# Patient Record
Sex: Female | Born: 1980 | Race: Black or African American | Hispanic: No | Marital: Single | Smoking: Never smoker
Health system: Southern US, Community
[De-identification: ages and names within clinical notes are randomized; demographics above are authoritative.]

## PROBLEM LIST (undated history)

## (undated) DIAGNOSIS — G43909 Migraine, unspecified, not intractable, without status migrainosus: Secondary | ICD-10-CM

## (undated) DIAGNOSIS — Z789 Other specified health status: Secondary | ICD-10-CM

## (undated) HISTORY — PX: OTHER SURGICAL HISTORY: SHX169

## (undated) HISTORY — PX: WISDOM TOOTH EXTRACTION: SHX21

## (undated) HISTORY — DX: Other specified health status: Z78.9

---

## 2014-10-23 DIAGNOSIS — Z600 Problems of adjustment to life-cycle transitions: Secondary | ICD-10-CM | POA: Insufficient documentation

## 2014-10-23 DIAGNOSIS — Z803 Family history of malignant neoplasm of breast: Secondary | ICD-10-CM | POA: Insufficient documentation

## 2014-11-03 DIAGNOSIS — E559 Vitamin D deficiency, unspecified: Secondary | ICD-10-CM | POA: Insufficient documentation

## 2017-05-07 DIAGNOSIS — M546 Pain in thoracic spine: Secondary | ICD-10-CM | POA: Insufficient documentation

## 2017-05-07 DIAGNOSIS — M545 Low back pain, unspecified: Secondary | ICD-10-CM

## 2017-05-07 DIAGNOSIS — M542 Cervicalgia: Secondary | ICD-10-CM | POA: Insufficient documentation

## 2017-05-07 HISTORY — DX: Low back pain, unspecified: M54.50

## 2017-06-13 DIAGNOSIS — M25551 Pain in right hip: Secondary | ICD-10-CM | POA: Insufficient documentation

## 2017-06-15 DIAGNOSIS — M654 Radial styloid tenosynovitis [de Quervain]: Secondary | ICD-10-CM | POA: Insufficient documentation

## 2017-07-31 DIAGNOSIS — M79672 Pain in left foot: Secondary | ICD-10-CM | POA: Insufficient documentation

## 2017-07-31 DIAGNOSIS — M21611 Bunion of right foot: Secondary | ICD-10-CM | POA: Insufficient documentation

## 2019-05-27 ENCOUNTER — Ambulatory Visit
Admission: RE | Admit: 2019-05-27 | Discharge: 2019-05-27 | Disposition: A | Discharge status: Home / Self Care | Source: Ambulatory Visit | Attending: Internal Medicine | Admitting: Internal Medicine

## 2019-05-27 ENCOUNTER — Encounter: Payer: Self-pay | Admitting: Internal Medicine

## 2019-05-27 ENCOUNTER — Other Ambulatory Visit: Payer: Self-pay

## 2019-05-27 ENCOUNTER — Ambulatory Visit (INDEPENDENT_AMBULATORY_CARE_PROVIDER_SITE_OTHER): Admitting: Internal Medicine

## 2019-05-27 DIAGNOSIS — G2581 Restless legs syndrome: Secondary | ICD-10-CM | POA: Diagnosis not present

## 2019-05-27 DIAGNOSIS — Z7689 Persons encountering health services in other specified circumstances: Secondary | ICD-10-CM | POA: Diagnosis not present

## 2019-05-27 DIAGNOSIS — M542 Cervicalgia: Secondary | ICD-10-CM | POA: Insufficient documentation

## 2019-05-27 DIAGNOSIS — M545 Low back pain: Secondary | ICD-10-CM | POA: Diagnosis present

## 2019-05-27 DIAGNOSIS — Z0001 Encounter for general adult medical examination with abnormal findings: Secondary | ICD-10-CM | POA: Diagnosis present

## 2019-05-27 DIAGNOSIS — G8929 Other chronic pain: Secondary | ICD-10-CM

## 2019-05-27 MED ORDER — MELOXICAM 15 MG PO TABS: 15.0000 mg | ORAL_TABLET | Freq: Every day | ORAL | 0 refills | Status: DC

## 2019-05-27 MED ORDER — CYCLOBENZAPRINE HCL 10 MG PO TABS: ORAL_TABLET | ORAL | 1 refills | Status: DC

## 2019-05-27 NOTE — Progress Notes (Signed)
[facility] [place], [place] [address]  Internal MEDICINE  Office Visit Note  Patient Name: Jaclyn Gray  [id number]  [id number]  Date of Service: 05/28/2019   Complaints/HPI Pt is here for establishment of PCP. Chief Complaint  Patient presents with  . Medical Management of Chronic Issues    New patient establish care , spider bite left eye , swelling ,no drainage coming from the eye.   HPI Pt is here for PCP. Pt denies any PMHX. Pain LBP around her periods. She also has neck pain. She hears a popping sound in her lower back whenever lifts, but no acute pain  Leg pain since age 57 after falling off an obstacle  Last labs 2019. 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 cyclobenzaprine (FLEXERIL) 10 MG tablet TAKE HALF TO ONE TAB PO QHS FOR NECK PAIN  . meloxicam (MOBIC) 15 MG tablet Take 1 tablet (15 mg total) by mouth daily. WITH FOOD FOR PAIN   No facility-administered encounter medications on file as of 05/27/2019.     Surgical History: Past Surgical History:  Procedure Laterality Date  . none      Medical History: History reviewed. No pertinent past medical history.  Family History: Family History  Problem Relation Age of Onset  . Hypertension Mother   . Breast cancer Mother   . Prostate cancer Father   . Hypertension Father   . Diabetes Father   . Throat cancer Maternal Grandmother   . Colon cancer Maternal Grandmother   . Lung cancer Maternal Grandfather     Social History   Socioeconomic History  . Marital status: Single    Spouse name: Not on file  . Number of children: Not on file  . Years of education: Not on file  . Highest education level:  Not on file  Occupational History  . Not on file  Social Needs  . Financial resource strain: Not on file  . Food insecurity:    Worry: Not on file    Inability: Not on file  . Transportation needs:    Medical: Not on file    Non-medical: Not on file  Tobacco Use  . Smoking status: Never Smoker  . Smokeless tobacco: Never Used  Substance and Sexual Activity  . Alcohol use: Yes    Comment: social   . Drug use: Never  . Sexual activity: Not on file  Lifestyle  . Physical activity:    Days per week: Not on file    Minutes per session: Not on file  . Stress: Not on file  Relationships  . Social connections:    Talks on phone: Not on file    Gets together: Not on file    Attends religious service: Not on file    Active member of club or organization: Not on file    Attends meetings of clubs or organizations: Not on file    Relationship status: Not on file  . Intimate partner violence:    Fear of current or ex partner: Not on file    Emotionally abused: Not on file    Physically abused: Not on file    Forced sexual activity:  Not on file  Other Topics Concern  . Not on file  Social History Narrative  . Not on file     Review of Systems  Constitutional: Negative for chills, diaphoresis and fatigue.  HENT: Negative for ear pain, postnasal drip and sinus pressure.   Eyes: Positive for itching (RESOLVED ). Negative for photophobia, discharge, redness and visual disturbance.  Respiratory: Negative for cough, shortness of breath and wheezing.   Cardiovascular: Negative for chest pain, palpitations and leg swelling.  Gastrointestinal: Negative for abdominal pain, constipation, diarrhea, nausea and vomiting.  Genitourinary: Negative for dysuria and flank pain.  Musculoskeletal: Positive for arthralgias and back pain. Negative for gait problem and neck pain.  Skin: Negative for color change.  Allergic/Immunologic: Negative for environmental allergies and food allergies.   Neurological: Negative for dizziness and headaches.  Hematological: Does not bruise/bleed easily.  Psychiatric/Behavioral: Negative for agitation, behavioral problems (depression) and hallucinations.    Vital Signs: BP 110/80 (BP Location: Left Arm, Patient Position: Sitting, Cuff Size: Normal)   Pulse 90   Temp 98.2 F (36.8 C) (Oral)   Resp 16   Ht 5' 1"  (1.549 m)   Wt 146 lb (66.2 kg)   LMP 05/21/2019   SpO2 99%   BMI 27.59 kg/m    Physical Exam Constitutional:      General: She is not in acute distress.    Appearance: She is well-developed. She is not diaphoretic.  HENT:     Head: Normocephalic and atraumatic.     Mouth/Throat:     Pharynx: No oropharyngeal exudate.  Eyes:     Pupils: Pupils are equal, round, and reactive to light.  Neck:     Musculoskeletal: Normal range of motion and neck supple.     Thyroid: No thyromegaly.     Vascular: No JVD.     Trachea: No tracheal deviation.  Cardiovascular:     Rate and Rhythm: Normal rate and regular rhythm.     Heart sounds: Normal heart sounds. No murmur. No friction rub. No gallop.   Pulmonary:     Effort: Pulmonary effort is normal. No respiratory distress.     Breath sounds: No wheezing or rales.  Chest:     Chest wall: No tenderness.  Abdominal:     General: Bowel sounds are normal.     Palpations: Abdomen is soft.  Musculoskeletal:     Comments: Slight ROM restriction in her C-spine and Lower back   Lymphadenopathy:     Cervical: No cervical adenopathy.  Skin:    General: Skin is warm and dry.  Neurological:     Mental Status: She is alert and oriented to person, place, and time.     Cranial Nerves: No cranial nerve deficit.  Psychiatric:        Behavior: Behavior normal.        Thought Content: Thought content normal.        Judgment: Judgment normal.    Assessment/Plan: 1. Chronic bilateral low back pain without sciatica - Start meloxicam (MOBIC) 15 MG tablet; Take 1 tablet (15 mg total) by mouth  daily. WITH FOOD FOR PAIN  Dispense: 30 tablet; Refill: 0 - CK Total (and CKMB) - Rheumatoid Factor - ANA Direct w/Reflex if Positive - Sed Rate (ESR)  2. Neck pain - cyclobenzaprine (FLEXERIL) 10 MG tablet; TAKE HALF TO ONE TAB PO QHS FOR NECK PAIN  Dispense: 30 tablet; Refill: 1 - ROM excercises   3. Restless leg - Fe+TIBC+Fer; Future  4.  Establishing care with new doctor, encounter for  - Labs were ordered   General Counseling: Tishina verbalizes understanding of the findings of todays visit and agrees with plan of treatment. I have discussed any further diagnostic evaluation that may be needed or ordered today. We also reviewed her medications today. she has been encouraged to call the office with any questions or concerns that should arise related to todays visit.  Counseling: Advised of stretching and yoga at home Avoid heavy lifting  Orders Placed This Encounter  Procedures  . DG Lumbar Spine Complete  . B12 and Folate Panel  . Fe+TIBC+Fer  . CBC with Differential/Platelet  . Lipid Panel With LDL/HDL Ratio  . TSH  . T4, free  . Comprehensive metabolic panel  . Sed Rate (ESR)  . ANA Direct w/Reflex if Positive  . Rheumatoid Factor  . CK Total (and CKMB)    Meds ordered this encounter  Medications  . meloxicam (MOBIC) 15 MG tablet    Sig: Take 1 tablet (15 mg total) by mouth daily. WITH FOOD FOR PAIN    Dispense:  30 tablet    Refill:  0  . cyclobenzaprine (FLEXERIL) 10 MG tablet    Sig: TAKE HALF TO ONE TAB PO QHS FOR NECK PAIN    Dispense:  30 tablet    Refill:  1    Time spent:30 Minutes

## 2019-05-28 LAB — COMPREHENSIVE METABOLIC PANEL
ALT: 15 IU/L (ref 0–32)
AST: 20 IU/L (ref 0–40)
Albumin/Globulin Ratio: 1.6 (ref 1.2–2.2)
Albumin: 4.4 g/dL (ref 3.8–4.8)
Alkaline Phosphatase: 46 IU/L (ref 39–117)
BUN/Creatinine Ratio: 8 — ABNORMAL LOW (ref 9–23)
BUN: 7 mg/dL (ref 6–20)
Bilirubin Total: 0.4 mg/dL (ref 0.0–1.2)
CO2: 24 mmol/L (ref 20–29)
Calcium: 9.8 mg/dL (ref 8.7–10.2)
Chloride: 102 mmol/L (ref 96–106)
Creatinine, Ser: 0.87 mg/dL (ref 0.57–1.00)
GFR calc Af Amer: 98 mL/min/{1.73_m2} (ref >59)
GFR calc non Af Amer: 85 mL/min/{1.73_m2} (ref >59)
Globulin, Total: 2.7 g/dL (ref 1.5–4.5)
Glucose: 90 mg/dL (ref 65–99)
Potassium: 4.7 mmol/L (ref 3.5–5.2)
Sodium: 139 mmol/L (ref 134–144)
Total Protein: 7.1 g/dL (ref 6.0–8.5)

## 2019-05-28 LAB — CBC WITH DIFFERENTIAL/PLATELET
Basophils Absolute: 0 10*3/uL (ref 0.0–0.2)
Basos: 1 %
EOS (ABSOLUTE): 0.1 10*3/uL (ref 0.0–0.4)
Eos: 2 %
Hematocrit: 39.7 % (ref 34.0–46.6)
Hemoglobin: 13.9 g/dL (ref 11.1–15.9)
Immature Grans (Abs): 0 10*3/uL (ref 0.0–0.1)
Immature Granulocytes: 0 %
Lymphocytes Absolute: 1.9 10*3/uL (ref 0.7–3.1)
Lymphs: 29 %
MCH: 32.4 pg (ref 26.6–33.0)
MCHC: 35 g/dL (ref 31.5–35.7)
MCV: 93 fL (ref 79–97)
Monocytes Absolute: 0.5 10*3/uL (ref 0.1–0.9)
Monocytes: 7 %
Neutrophils Absolute: 4.1 10*3/uL (ref 1.4–7.0)
Neutrophils: 61 %
Platelets: 254 10*3/uL (ref 150–450)
RBC: 4.29 x10E6/uL (ref 3.77–5.28)
RDW: 11.9 % (ref 11.7–15.4)
WBC: 6.5 10*3/uL (ref 3.4–10.8)

## 2019-05-28 LAB — CK TOTAL AND CKMB (NOT AT ARMC)
CK-MB Index: 1.3 ng/mL (ref 0.0–5.3)
Total CK: 173 U/L (ref 32–182)

## 2019-05-28 LAB — ANA W/REFLEX IF POSITIVE: Anti Nuclear Antibody (ANA): NEGATIVE

## 2019-05-28 LAB — TSH: TSH: 2.7 u[IU]/mL (ref 0.450–4.500)

## 2019-05-28 LAB — IRON,TIBC AND FERRITIN PANEL
Ferritin: 40 ng/mL (ref 15–150)
Iron Saturation: 33 % (ref 15–55)
Iron: 106 ug/dL (ref 27–159)
Total Iron Binding Capacity: 318 ug/dL (ref 250–450)
UIBC: 212 ug/dL (ref 131–425)

## 2019-05-28 LAB — B12 AND FOLATE PANEL
Folate: 9.4 ng/mL (ref >3.0)
Vitamin B-12: 528 pg/mL (ref 232–1245)

## 2019-05-28 LAB — LIPID PANEL WITH LDL/HDL RATIO
Cholesterol, Total: 212 mg/dL — ABNORMAL HIGH (ref 100–199)
HDL: 73 mg/dL (ref >39)
LDL Calculated: 117 mg/dL — ABNORMAL HIGH (ref 0–99)
LDl/HDL Ratio: 1.6 ratio (ref 0.0–3.2)
Triglycerides: 111 mg/dL (ref 0–149)
VLDL Cholesterol Cal: 22 mg/dL (ref 5–40)

## 2019-05-28 LAB — SEDIMENTATION RATE: Sed Rate: 6 mm/hr (ref 0–32)

## 2019-05-28 LAB — T4, FREE: Free T4: 0.83 ng/dL (ref 0.82–1.77)

## 2019-05-28 LAB — RHEUMATOID FACTOR: Rheumatoid fact SerPl-aCnc: 12.6 IU/mL (ref 0.0–13.9)

## 2019-06-10 ENCOUNTER — Encounter: Payer: Self-pay | Admitting: Internal Medicine

## 2019-06-17 ENCOUNTER — Other Ambulatory Visit: Payer: Self-pay

## 2019-06-17 ENCOUNTER — Other Ambulatory Visit: Payer: Self-pay | Admitting: Internal Medicine

## 2019-06-17 ENCOUNTER — Ambulatory Visit (INDEPENDENT_AMBULATORY_CARE_PROVIDER_SITE_OTHER): Admitting: Internal Medicine

## 2019-06-17 ENCOUNTER — Encounter: Payer: Self-pay | Admitting: Internal Medicine

## 2019-06-17 DIAGNOSIS — M545 Low back pain, unspecified: Secondary | ICD-10-CM

## 2019-06-17 DIAGNOSIS — Z1231 Encounter for screening mammogram for malignant neoplasm of breast: Secondary | ICD-10-CM | POA: Diagnosis not present

## 2019-06-17 DIAGNOSIS — M542 Cervicalgia: Secondary | ICD-10-CM

## 2019-06-17 DIAGNOSIS — G8929 Other chronic pain: Secondary | ICD-10-CM

## 2019-06-17 NOTE — Progress Notes (Signed)
[facility] [name], [place] [address]  Internal MEDICINE  Office Visit Note  Patient Name: Jaclyn Gray  [id number]  [id number]  Date of Service: 06/23/2019  Chief Complaint  Patient presents with  . Medical Management of Chronic Issues    3wk follow up    . Labs Only    review labs    HPI Pt is here for follow up on her back pain, she is feeling better after starting her meds, sleeping better, she wished to conceive and will like to set up a pap smear and mammogram, no other complaints  All labs and xray results are here   Current Medication: Outpatient Encounter Medications as of 06/17/2019  Medication Sig  . cyclobenzaprine (FLEXERIL) 10 MG tablet TAKE HALF TO ONE TAB PO QHS FOR NECK PAIN  . meloxicam (MOBIC) 15 MG tablet Take 1 tablet (15 mg total) by mouth daily. WITH FOOD FOR PAIN   No facility-administered encounter medications on file as of 06/17/2019.     Surgical History: Past Surgical History:  Procedure Laterality Date  . none      Medical History: History reviewed. No pertinent past medical history.  Family History: Family History  Problem Relation Age of Onset  . Hypertension Mother   . Breast cancer Mother   . Prostate cancer Father   . Hypertension Father   . Diabetes Father   . Throat cancer Maternal Grandmother   . Colon cancer Maternal Grandmother   . Lung cancer Maternal Grandfather     Social History   Socioeconomic History  . Marital status: Single    Spouse name: Not on file  . Number of children: Not on file  . Years of education: Not on file  . Highest education level: Not on file  Occupational History  . Not on file  Social Needs  . Financial resource strain: Not on file  . Food insecurity    Worry: Not on file    Inability: Not on file  . Transportation needs    Medical: Not on file    Non-medical: Not on file  Tobacco Use  . Smoking status: Never Smoker  . Smokeless tobacco: Never Used   Substance and Sexual Activity  . Alcohol use: Yes    Comment: social   . Drug use: Never  . Sexual activity: Not on file  Lifestyle  . Physical activity    Days per week: Not on file    Minutes per session: Not on file  . Stress: Not on file  Relationships  . Social Herbalist on phone: Not on file    Gets together: Not on file    Attends religious service: Not on file    Active member of club or organization: Not on file    Attends meetings of clubs or organizations: Not on file    Relationship status: Not on file  . Intimate partner violence    Fear of current or ex partner: Not on file    Emotionally abused: Not on file    Physically abused: Not on file    Forced sexual activity: Not on file  Other Topics Concern  . Not on file  Social History Narrative  . Not on file    Review of Systems  Constitutional: Negative for chills.  HENT: Negative.   Respiratory: Negative for cough, shortness of breath and wheezing.   Cardiovascular: Negative for chest pain, palpitations and leg swelling.  Genitourinary: Negative  for dysuria and flank pain.  Musculoskeletal: Positive for back pain. Negative for arthralgias and gait problem.  Allergic/Immunologic: Negative for environmental allergies and food allergies.  Neurological: Negative for dizziness and headaches.  Psychiatric/Behavioral: Negative for agitation, behavioral problems (depression) and hallucinations.   Vital Signs: BP 132/89 (BP Location: Right Arm, Patient Position: Sitting, Cuff Size: Normal)   Pulse 83   Resp 16   Ht 5\' 1"  (1.549 m)   Wt 149 lb (67.6 kg)   LMP 05/21/2019   SpO2 98%   BMI 28.15 kg/m    Physical Exam Vitals signs reviewed.  Constitutional:      Appearance: Normal appearance.  HENT:     Head: Normocephalic and atraumatic.  Neck:     Musculoskeletal: Normal range of motion and neck supple.  Cardiovascular:     Rate and Rhythm: Normal rate and regular rhythm.  Pulmonary:      Effort: Pulmonary effort is normal.     Breath sounds: Normal breath sounds.  Musculoskeletal:        General: No tenderness.  Neurological:     General: No focal deficit present.     Mental Status: She is alert and oriented to person, place, and time.    Assessment/Plan: 1. Chronic bilateral low back pain without sciatica - Improved, continue supportive care   2. Neck pain - Resolved   3. Visit for screening mammogram - Mammogram is ordered   General Counseling: Sharnette verbalizes understanding of the findings of todays visit and agrees with plan of treatment. I have discussed any further diagnostic evaluation that may be needed or ordered today. We also reviewed her medications today. she has been encouraged to call the office with any questions or concerns that should arise related to todays visit.   Time spent:20Minutes  Dr Lavera Guise Internal medicine

## 2019-07-18 ENCOUNTER — Ambulatory Visit (INDEPENDENT_AMBULATORY_CARE_PROVIDER_SITE_OTHER): Admitting: Nurse Practitioner

## 2019-07-18 ENCOUNTER — Other Ambulatory Visit: Payer: Self-pay

## 2019-07-18 ENCOUNTER — Encounter: Payer: Self-pay | Admitting: Nurse Practitioner

## 2019-07-18 VITALS — BP 119/86 | HR 75 | Resp 16 | Ht 61.0 in | Wt 150.0 lb

## 2019-07-18 DIAGNOSIS — I8311 Varicose veins of right lower extremity with inflammation: Secondary | ICD-10-CM | POA: Diagnosis not present

## 2019-07-18 DIAGNOSIS — Z124 Encounter for screening for malignant neoplasm of cervix: Secondary | ICD-10-CM | POA: Diagnosis not present

## 2019-07-18 DIAGNOSIS — Z0001 Encounter for general adult medical examination with abnormal findings: Secondary | ICD-10-CM

## 2019-07-18 DIAGNOSIS — Z9189 Other specified personal risk factors, not elsewhere classified: Secondary | ICD-10-CM

## 2019-07-18 DIAGNOSIS — R300 Dysuria: Secondary | ICD-10-CM

## 2019-07-18 DIAGNOSIS — I8312 Varicose veins of left lower extremity with inflammation: Secondary | ICD-10-CM

## 2019-07-18 NOTE — Progress Notes (Signed)
[facility] [address] [place], [place] [address]  Internal MEDICINE  Office Visit Note  Patient Name: Jaclyn Gray  11/27/1981  [id number]  Date of Service: 08/02/2019  Chief Complaint  Patient presents with  . Annual Exam  . Gynecologic Exam     The patient is here for health maintenance exam and pap smear. She is having tenderness of both lower legs. She does have some varicose veins. Pain is more severe when she is on her feet for long periods of time. Tenderness gets better with rest.  She is interested in talking to OB/GYN provider about fertility in the future. She is considering having a child and knows that she has increased risk of adverse outcomes due to age.     Current Medication: Outpatient Encounter Medications as of 07/18/2019  Medication Sig  . cyclobenzaprine (FLEXERIL) 10 MG tablet TAKE HALF TO ONE TAB PO QHS FOR NECK PAIN  . meloxicam (MOBIC) 15 MG tablet Take 1 tablet (15 mg total) by mouth daily. WITH FOOD FOR PAIN   No facility-administered encounter medications on file as of 07/18/2019.     Surgical History: Past Surgical History:  Procedure Laterality Date  . none      Medical History: History reviewed. No pertinent past medical history.  Family History: Family History  Problem Relation Age of Onset  . Hypertension Mother   . Breast cancer Mother 56  . Prostate cancer Father   . Hypertension Father   . Diabetes Father   . Throat cancer Maternal Grandmother   . Colon cancer Maternal Grandmother   . Lung cancer Maternal Grandfather       Review of Systems  Constitutional: Negative for chills, fatigue and unexpected weight change.  HENT: Negative for congestion, postnasal drip, rhinorrhea, sneezing and sore throat.   Eyes: Negative for redness.  Respiratory: Negative for cough, chest tightness and shortness of breath.   Cardiovascular: Negative for chest pain and palpitations.       Varicose veins with lower leg  pain.   Gastrointestinal: Negative for abdominal pain, constipation, diarrhea, nausea and vomiting.  Endocrine: Negative for cold intolerance, heat intolerance, polydipsia and polyuria.  Genitourinary: Negative for dysuria, frequency, menstrual problem and vaginal discharge.  Musculoskeletal: Negative for arthralgias, back pain, joint swelling and neck pain.  Skin: Negative for rash.  Allergic/Immunologic: Negative for environmental allergies.  Neurological: Negative for dizziness, tremors, numbness and headaches.  Hematological: Negative for adenopathy. Does not bruise/bleed easily.  Psychiatric/Behavioral: Negative for behavioral problems (Depression), sleep disturbance and suicidal ideas. The patient is not nervous/anxious.      Today's Vitals   07/18/19 1154  BP: 119/86  Pulse: 75  Resp: 16  SpO2: 99%  Weight: 150 lb (68 kg)  Height: 5' 1" (1.549 m)   Body mass index is 28.34 kg/m.  Physical Exam Vitals signs and nursing note reviewed.  Constitutional:      General: She is not in acute distress.    Appearance: Normal appearance. She is well-developed. She is not diaphoretic.  HENT:     Head: Normocephalic and atraumatic.     Mouth/Throat:     Pharynx: No oropharyngeal exudate.  Eyes:     Pupils: Pupils are equal, round, and reactive to light.  Neck:     Musculoskeletal: Normal range of motion and neck supple.     Thyroid: No thyromegaly.     Vascular: No JVD.     Trachea: No tracheal deviation.  Cardiovascular:       Rate and Rhythm: Normal rate and regular rhythm.     Pulses: Normal pulses.     Heart sounds: Normal heart sounds. No murmur. No friction rub. No gallop.   Pulmonary:     Effort: Pulmonary effort is normal. No respiratory distress.     Breath sounds: Normal breath sounds. No wheezing or rales.  Chest:     Chest wall: No tenderness.     Breasts:        Right: Normal. No swelling, bleeding, inverted nipple, mass, nipple discharge, skin change or  tenderness.        Left: Normal. No swelling, bleeding, inverted nipple, mass, nipple discharge, skin change or tenderness.  Abdominal:     General: Bowel sounds are normal.     Palpations: Abdomen is soft.     Tenderness: There is no abdominal tenderness.     Hernia: There is no hernia in the left inguinal area or right inguinal area.  Genitourinary:    General: Normal vulva.     Exam position: Supine.     Labia:        Right: No tenderness.        Left: No tenderness.      Vagina: No vaginal discharge, erythema or tenderness.     Cervix: No cervical motion tenderness, discharge, friability or erythema.     Uterus: Normal.      Adnexa: Right adnexa normal and left adnexa normal.     Comments: No tenderness, masses, or organomeglay present during bimanual exam . Musculoskeletal: Normal range of motion.  Lymphadenopathy:     Cervical: No cervical adenopathy.     Lower Body: No right inguinal adenopathy. No left inguinal adenopathy.  Skin:    General: Skin is warm and dry.  Neurological:     Mental Status: She is alert and oriented to person, place, and time.     Cranial Nerves: No cranial nerve deficit.  Psychiatric:        Behavior: Behavior normal.        Thought Content: Thought content normal.        Judgment: Judgment normal.      LABS: Recent Results (from the past 2160 hour(s))  Lipid Panel With LDL/HDL Ratio     Status: Abnormal   Collection Time: 05/27/19 11:53 AM  Result Value Ref Range   Cholesterol, Total 212 (H) 100 - 199 mg/dL   Triglycerides 111 0 - 149 mg/dL   HDL 73 >39 mg/dL   VLDL Cholesterol Cal 22 5 - 40 mg/dL   LDL Calculated 117 (H) 0 - 99 mg/dL   LDl/HDL Ratio 1.6 0.0 - 3.2 ratio    Comment:                                     LDL/HDL Ratio                                             Men  Women                               1/2 Avg.Risk  1.0    1.5  Avg.Risk  3.6    3.2                                2X Avg.Risk   6.2    5.0                                3X Avg.Risk  8.0    6.1   T4, free     Status: None   Collection Time: 05/27/19 11:54 AM  Result Value Ref Range   Free T4 0.83 0.82 - 1.77 ng/dL  TSH     Status: None   Collection Time: 05/27/19 11:54 AM  Result Value Ref Range   TSH 2.700 0.450 - 4.500 uIU/mL  CBC with Differential/Platelet     Status: None   Collection Time: 05/27/19 11:54 AM  Result Value Ref Range   WBC 6.5 3.4 - 10.8 x10E3/uL   RBC 4.29 3.77 - 5.28 x10E6/uL   Hemoglobin 13.9 11.1 - 15.9 g/dL   Hematocrit 39.7 34.0 - 46.6 %   MCV 93 79 - 97 fL   MCH 32.4 26.6 - 33.0 pg   MCHC 35.0 31.5 - 35.7 g/dL   RDW 11.9 11.7 - 15.4 %   Platelets 254 150 - 450 x10E3/uL   Neutrophils 61 Not Estab. %   Lymphs 29 Not Estab. %   Monocytes 7 Not Estab. %   Eos 2 Not Estab. %   Basos 1 Not Estab. %   Neutrophils Absolute 4.1 1.4 - 7.0 x10E3/uL   Lymphocytes Absolute 1.9 0.7 - 3.1 x10E3/uL   Monocytes Absolute 0.5 0.1 - 0.9 x10E3/uL   EOS (ABSOLUTE) 0.1 0.0 - 0.4 x10E3/uL   Basophils Absolute 0.0 0.0 - 0.2 x10E3/uL   Immature Granulocytes 0 Not Estab. %   Immature Grans (Abs) 0.0 0.0 - 0.1 x10E3/uL  ANA Direct w/Reflex if Positive     Status: None   Collection Time: 05/27/19 11:59 AM  Result Value Ref Range   Anti Nuclear Antibody (ANA) Negative Negative  Sed Rate (ESR)     Status: None   Collection Time: 05/27/19 11:59 AM  Result Value Ref Range   Sed Rate 6 0 - 32 mm/hr  B12 and Folate Panel     Status: None   Collection Time: 05/27/19 12:00 PM  Result Value Ref Range   Vitamin B-12 528 232 - 1,245 pg/mL   Folate 9.4 >3.0 ng/mL    Comment: A serum folate concentration of less than 3.1 ng/mL is considered to represent clinical deficiency.   Rheumatoid Factor     Status: None   Collection Time: 05/27/19 12:05 PM  Result Value Ref Range   Rhuematoid fact SerPl-aCnc 12.6 0.0 - 13.9 IU/mL  Fe+TIBC+Fer     Status: None   Collection Time: 05/27/19 12:05 PM  Result Value  Ref Range   Total Iron Binding Capacity 318 250 - 450 ug/dL   UIBC 212 131 - 425 ug/dL   Iron 106 27 - 159 ug/dL   Iron Saturation 33 15 - 55 %   Ferritin 40 15 - 150 ng/mL  Comprehensive metabolic panel     Status: Abnormal   Collection Time: 05/27/19 12:06 PM  Result Value Ref Range   Glucose 90 65 - 99 mg/dL   BUN 7 6 - 20 mg/dL   Creatinine, Ser 0.87 0.57 - 1.00   mg/dL   GFR calc non Af Amer 85 >59 mL/min/1.73   GFR calc Af Amer 98 >59 mL/min/1.73   BUN/Creatinine Ratio 8 (L) 9 - 23   Sodium 139 134 - 144 mmol/L   Potassium 4.7 3.5 - 5.2 mmol/L   Chloride 102 96 - 106 mmol/L   CO2 24 20 - 29 mmol/L   Calcium 9.8 8.7 - 10.2 mg/dL   Total Protein 7.1 6.0 - 8.5 g/dL   Albumin 4.4 3.8 - 4.8 g/dL   Globulin, Total 2.7 1.5 - 4.5 g/dL   Albumin/Globulin Ratio 1.6 1.2 - 2.2   Bilirubin Total 0.4 0.0 - 1.2 mg/dL   Alkaline Phosphatase 46 39 - 117 IU/L   AST 20 0 - 40 IU/L   ALT 15 0 - 32 IU/L  CK Total (and CKMB)     Status: None   Collection Time: 05/27/19 12:06 PM  Result Value Ref Range   Total CK 173 32 - 182 U/L    Comment:               **Please note reference interval change**   CK-MB Index 1.3 0.0 - 5.3 ng/mL  UA/M w/rflx Culture, Routine     Status: None   Collection Time: 07/18/19 11:30 AM   Specimen: Urine   URINE  Result Value Ref Range   Specific Gravity, UA 1.006 1.005 - 1.030   pH, UA 5.5 5.0 - 7.5   Color, UA Yellow Yellow   Appearance Ur Clear Clear   Leukocytes,UA Negative Negative   Protein,UA Negative Negative/Trace   Glucose, UA Negative Negative   Ketones, UA Negative Negative   RBC, UA Negative Negative   Bilirubin, UA Negative Negative   Urobilinogen, Ur 0.2 0.2 - 1.0 mg/dL   Nitrite, UA Negative Negative   Microscopic Examination Comment     Comment: Microscopic follows if indicated.   Microscopic Examination See below:     Comment: Microscopic was indicated and was performed.   Urinalysis Reflex Comment     Comment: This specimen will not  reflex to a Urine Culture.  Microscopic Examination     Status: None   Collection Time: 07/18/19 11:30 AM   URINE  Result Value Ref Range   WBC, UA 0-5 0 - 5 /hpf   RBC None seen 0 - 2 /hpf   Epithelial Cells (non renal) 0-10 0 - 10 /hpf   Casts None seen None seen /lpf   Bacteria, UA Few None seen/Few  Pap IG and HPV (high risk) DNA detection     Status: Abnormal   Collection Time: 07/18/19 12:04 PM  Result Value Ref Range   Interpretation NILM,QC     Comment: NEGATIVE FOR INTRAEPITHELIAL LESION OR MALIGNANCY. THIS SPECIMEN WAS RESCREENED AS PART OF OUR QUALITY CONTROL PROGRAM.    Category NIL     Comment: Negative for Intraepithelial Lesion   Adequacy SECNI,AOCX     Comment: Satisfactory for evaluation. No endocervical component is identified. The absence of an endocervical component was confirmed by an additional screening evaluation.    Clinician Provided ICD10 Comment     Comment: Z12.4   Performed by: Comment     Comment: Mickel Crow, Cytotechnologist (ASCP)   QC reviewed by: Comment     Comment: Clyde Canterbury, Cytotechnologist (ASCP)   Note: Comment     Comment: The Pap smear is a screening test designed to aid in the detection of premalignant and malignant conditions of the uterine cervix.  It  is not a diagnostic procedure and should not be used as the sole means of detecting cervical cancer.  Both false-positive and false-negative reports do occur.    Test Methodology Comment     Comment: This liquid based ThinPrep(R) pap test was screened with the use of an image guided system.    HPV, high-risk Positive (A) Negative    Comment: This nucleic acid amplification high-risk HPV test detects thirteen high-risk types (16,18,31,33,35,39,45,51,52,56,58,59,68) without differentiation.    Assessment/Plan: 1. Encounter for annual general medical examination with abnormal findings in adult Annual health maintenance exam today.   2. Varicose veins of both lower  extremities with inflammation Will ultrasound the veins of bilateral lower extremities for further evaluation.  - VAS US LOWER EXTREMITY VENOUS REFLUX; Future  3. At risk for fertility problems Refer to OB/BYN for further evaluation.  - Ambulatory referral to Obstetrics / Gynecology  4. Routine cervical smear - Pap IG and HPV (high risk) DNA detection  5. Dysuria - UA/M w/rflx Culture, Routine  General Counseling: Kessler verbalizes understanding of the findings of todays visit and agrees with plan of treatment. I have discussed any further diagnostic evaluation that may be needed or ordered today. We also reviewed her medications today. she has been encouraged to call the office with any questions or concerns that should arise related to todays visit.    Counseling:  This patient was seen by Heather Boscia FNP Collaboration with Dr Fozia M Khan as a part of collaborative care agreement  Orders Placed This Encounter  Procedures  . Microscopic Examination  . UA/M w/rflx Culture, Routine  . Ambulatory referral to Obstetrics / Gynecology      Time spent: 30 Minutes      Fozia M Khan, MD  Internal Medicine  

## 2019-07-19 LAB — MICROSCOPIC EXAMINATION
Casts: NONE SEEN /lpf
RBC, Urine: NONE SEEN /hpf (ref 0–2)

## 2019-07-19 LAB — UA/M W/RFLX CULTURE, ROUTINE
Bilirubin, UA: NEGATIVE
Glucose, UA: NEGATIVE
Ketones, UA: NEGATIVE
Leukocytes,UA: NEGATIVE
Nitrite, UA: NEGATIVE
Protein,UA: NEGATIVE
RBC, UA: NEGATIVE
Specific Gravity, UA: 1.006 (ref 1.005–1.030)
Urobilinogen, Ur: 0.2 mg/dL (ref 0.2–1.0)
pH, UA: 5.5 (ref 5.0–7.5)

## 2019-07-25 ENCOUNTER — Ambulatory Visit
Admission: RE | Admit: 2019-07-25 | Discharge: 2019-07-25 | Disposition: A | Discharge status: Home / Self Care | Source: Ambulatory Visit | Attending: Internal Medicine | Admitting: Internal Medicine

## 2019-07-25 ENCOUNTER — Other Ambulatory Visit: Payer: Self-pay

## 2019-07-25 ENCOUNTER — Ambulatory Visit

## 2019-07-25 DIAGNOSIS — Z1231 Encounter for screening mammogram for malignant neoplasm of breast: Secondary | ICD-10-CM | POA: Diagnosis present

## 2019-07-25 DIAGNOSIS — I8312 Varicose veins of left lower extremity with inflammation: Secondary | ICD-10-CM

## 2019-07-25 DIAGNOSIS — I8311 Varicose veins of right lower extremity with inflammation: Secondary | ICD-10-CM

## 2019-07-28 ENCOUNTER — Other Ambulatory Visit: Payer: Self-pay | Admitting: Internal Medicine

## 2019-07-28 DIAGNOSIS — R928 Other abnormal and inconclusive findings on diagnostic imaging of breast: Secondary | ICD-10-CM

## 2019-07-28 DIAGNOSIS — N6310 Unspecified lump in the right breast, unspecified quadrant: Secondary | ICD-10-CM

## 2019-07-29 ENCOUNTER — Ambulatory Visit: Admitting: Adult Health

## 2019-07-29 LAB — PAP IG AND HPV HIGH-RISK: HPV, high-risk: POSITIVE — AB

## 2019-07-30 ENCOUNTER — Ambulatory Visit (INDEPENDENT_AMBULATORY_CARE_PROVIDER_SITE_OTHER): Admitting: Adult Health

## 2019-07-30 ENCOUNTER — Other Ambulatory Visit: Payer: Self-pay

## 2019-07-30 ENCOUNTER — Encounter: Payer: Self-pay | Admitting: Adult Health

## 2019-07-30 VITALS — HR 77 | Ht 61.0 in | Wt 147.0 lb

## 2019-07-30 DIAGNOSIS — I8311 Varicose veins of right lower extremity with inflammation: Secondary | ICD-10-CM | POA: Diagnosis not present

## 2019-07-30 DIAGNOSIS — G2581 Restless legs syndrome: Secondary | ICD-10-CM

## 2019-07-30 DIAGNOSIS — I8312 Varicose veins of left lower extremity with inflammation: Secondary | ICD-10-CM | POA: Diagnosis not present

## 2019-07-30 NOTE — Progress Notes (Signed)
[facility] [place], [place] [address]  Internal MEDICINE  Telephone Visit  Patient Name: Jaclyn Gray  [id number]  [id number]  Date of Service: 07/30/2019  I connected with the patient at [phone] by telephone and verified the patients identity using two identifiers.   I discussed the limitations, risks, security and privacy concerns of performing an evaluation and management service by telephone and the availability of in person appointments. I also discussed with the patient that there may be a patient responsible charge related to the service.  The patient expressed understanding and agrees to proceed.    Chief Complaint  Patient presents with  . Telephone Assessment  . Telephone Screen  . Follow-up    u/s for venous lower extremity    HPI  Pt seen via telephone for follow on [place] results.  She had a lower extremity venous ultrasound. The was no evidence of DVT on ultrasound.  There was some small reflux noted in left greater saphenous vein.    Current Medication: Outpatient Encounter Medications as of 07/30/2019  Medication Sig  . cyclobenzaprine (FLEXERIL) 10 MG tablet TAKE HALF TO ONE TAB PO QHS FOR NECK PAIN  . meloxicam (MOBIC) 15 MG tablet Take 1 tablet (15 mg total) by mouth daily. WITH FOOD FOR PAIN   No facility-administered encounter medications on file as of 07/30/2019.     Surgical History: Past Surgical History:  Procedure Laterality Date  . none      Medical History: History reviewed. No pertinent past medical history.  Family History: Family History  Problem Relation Age of Onset  . Hypertension Mother   . Breast cancer Mother 89  . Prostate cancer Father   . Hypertension Father   . Diabetes Father   . Throat cancer Maternal Grandmother   . Colon cancer Maternal Grandmother   . Lung cancer Maternal Grandfather     Social History   Socioeconomic History  . Marital status: Single    Spouse name: Not on file  . Number of  children: Not on file  . Years of education: Not on file  . Highest education level: Not on file  Occupational History  . Not on file  Social Needs  . Financial resource strain: Not on file  . Food insecurity    Worry: Not on file    Inability: Not on file  . Transportation needs    Medical: Not on file    Non-medical: Not on file  Tobacco Use  . Smoking status: Never Smoker  . Smokeless tobacco: Never Used  Substance and Sexual Activity  . Alcohol use: Yes    Comment: social   . Drug use: Never  . Sexual activity: Not on file  Lifestyle  . Physical activity    Days per week: Not on file    Minutes per session: Not on file  . Stress: Not on file  Relationships  . Social Herbalist on phone: Not on file    Gets together: Not on file    Attends religious service: Not on file    Active member of club or organization: Not on file    Attends meetings of clubs or organizations: Not on file    Relationship status: Not on file  . Intimate partner violence    Fear of current or ex partner: Not on file    Emotionally abused: Not on file    Physically abused: Not on file    Forced sexual  activity: Not on file  Other Topics Concern  . Not on file  Social History Narrative  . Not on file      Review of Systems  Constitutional: Negative for chills, fatigue and unexpected weight change.  HENT: Negative for congestion, rhinorrhea, sneezing and sore throat.   Eyes: Negative for photophobia, pain and redness.  Respiratory: Negative for cough, chest tightness and shortness of breath.   Cardiovascular: Negative for chest pain and palpitations.  Gastrointestinal: Negative for abdominal pain, constipation, diarrhea, nausea and vomiting.  Endocrine: Negative.   Genitourinary: Negative for dysuria and frequency.  Musculoskeletal: Negative for arthralgias, back pain, joint swelling and neck pain.  Skin: Negative for rash.  Allergic/Immunologic: Negative.   Neurological:  Negative for tremors and numbness.  Hematological: Negative for adenopathy. Does not bruise/bleed easily.  Psychiatric/Behavioral: Negative for behavioral problems and sleep disturbance. The patient is not nervous/anxious.     Vital Signs: Pulse 77   Ht 5\' 1"  (2.876 m)   Wt 147 lb (66.7 kg)   LMP 07/10/2019 (Approximate)   BMI 27.78 kg/m    Observation/Objective:  Well appearing, nad noted.    Assessment/Plan: 1. Varicose veins of both lower extremities with inflammation Discussed [place] results with patient.  Will continue to follow, and discussed referral to vascular in future.   2. Restless leg Stable, continue present management.   General Counseling: Khrystyne verbalizes understanding of the findings of today's phone visit and agrees with plan of treatment. I have discussed any further diagnostic evaluation that may be needed or ordered today. We also reviewed her medications today. she has been encouraged to call the office with any questions or concerns that should arise related to todays visit.    No orders of the defined types were placed in this encounter.   No orders of the defined types were placed in this encounter.   Time spent: [place] AGNP-C Internal medicine

## 2019-08-02 DIAGNOSIS — I8311 Varicose veins of right lower extremity with inflammation: Secondary | ICD-10-CM

## 2019-08-02 DIAGNOSIS — Z9189 Other specified personal risk factors, not elsewhere classified: Secondary | ICD-10-CM

## 2019-08-02 DIAGNOSIS — Z124 Encounter for screening for malignant neoplasm of cervix: Secondary | ICD-10-CM | POA: Insufficient documentation

## 2019-08-02 DIAGNOSIS — R300 Dysuria: Secondary | ICD-10-CM | POA: Insufficient documentation

## 2019-08-02 DIAGNOSIS — R87810 Cervical high risk human papillomavirus (HPV) DNA test positive: Secondary | ICD-10-CM | POA: Insufficient documentation

## 2019-08-02 DIAGNOSIS — I8312 Varicose veins of left lower extremity with inflammation: Secondary | ICD-10-CM

## 2019-08-02 HISTORY — DX: Varicose veins of right lower extremity with inflammation: I83.11

## 2019-08-02 HISTORY — DX: Varicose veins of right lower extremity with inflammation: I83.12

## 2019-08-02 HISTORY — DX: Other specified personal risk factors, not elsewhere classified: Z91.89

## 2019-08-05 ENCOUNTER — Encounter: Admitting: Obstetrics and Gynecology

## 2019-08-05 NOTE — Progress Notes (Signed)
I have and she is being referred to GYN. Initially to talk about fertility options, but adding the HPV positive into consultation.

## 2019-08-06 NOTE — Progress Notes (Signed)
Review at visit 07/28/2019

## 2019-08-07 ENCOUNTER — Ambulatory Visit
Admission: RE | Admit: 2019-08-07 | Discharge: 2019-08-07 | Disposition: A | Discharge status: Home / Self Care | Source: Ambulatory Visit | Attending: Internal Medicine | Admitting: Internal Medicine

## 2019-08-07 DIAGNOSIS — N6310 Unspecified lump in the right breast, unspecified quadrant: Secondary | ICD-10-CM | POA: Diagnosis present

## 2019-08-07 DIAGNOSIS — R928 Other abnormal and inconclusive findings on diagnostic imaging of breast: Secondary | ICD-10-CM | POA: Insufficient documentation

## 2019-08-18 ENCOUNTER — Encounter: Admitting: Obstetrics and Gynecology

## 2019-09-04 ENCOUNTER — Encounter: Admitting: Obstetrics and Gynecology

## 2019-10-23 ENCOUNTER — Inpatient Hospital Stay: Admission: RE | Admit: 2019-10-23 | Source: Ambulatory Visit

## 2019-10-23 ENCOUNTER — Telehealth: Admitting: Physician Assistant

## 2019-10-23 DIAGNOSIS — R0602 Shortness of breath: Secondary | ICD-10-CM

## 2019-10-23 DIAGNOSIS — Z20822 Contact with and (suspected) exposure to covid-19: Secondary | ICD-10-CM

## 2019-10-23 MED ORDER — ALBUTEROL SULFATE HFA 108 (90 BASE) MCG/ACT IN AERS: 2.0000 | INHALATION_SPRAY | Freq: Four times a day (QID) | RESPIRATORY_TRACT | 0 refills | Status: DC | PRN

## 2019-10-23 MED ORDER — BENZONATATE 100 MG PO CAPS: 100.0000 mg | ORAL_CAPSULE | Freq: Three times a day (TID) | ORAL | 0 refills | Status: DC | PRN

## 2019-10-23 NOTE — Progress Notes (Signed)
E-Visit for Corona Virus Screening   Your current symptoms could be consistent with the coronavirus.  Many health care providers can now test patients at their office but not all are.  [place] [facility] has multiple testing sites. For information on our COVID testing locations and hours go to [email or web address]  Please quarantine yourself while awaiting your test results.  We are enrolling you in our [place] for [place] . Daily you will receive a questionnaire within the [facility] website. Our COVID 19 response team willl be monitoriing your responses daily.    COVID-19 is a respiratory illness with symptoms that are similar to the flu. Symptoms are typically mild to moderate, but there have been cases of severe illness and death due to the virus. The following symptoms may appear 2-14 days after exposure: . Fever . Cough . Shortness of breath or difficulty breathing . Chills . Repeated shaking with chills . Muscle pain . Headache . Sore throat . New loss of taste or smell . Fatigue . Congestion or runny nose . Nausea or vomiting . Diarrhea  It is vitally important that if you feel that you have an infection such as this virus or any other virus that you stay home and away from places where you may spread it to others.  You should self-quarantine for 14 days if you have symptoms that could potentially be coronavirus or have been in close contact a with a person diagnosed with COVID-19 within the last 2 weeks. You should avoid contact with people age 35 and older.   You should wear a mask or cloth face covering over your nose and mouth if you must be around other people or animals, including pets (even at home). Try to stay at least 6 feet away from other people. This will protect the people around you.  You can use medication such as A prescription cough medication called Tessalon Perles 100 mg. You may take 1-2 capsules every 8 hours as needed for cough  and A prescription inhaler called Albuterol MDI 90 mcg /actuation 2 puffs every 4 hours as needed for shortness of breath, wheezing, cough  You may also take acetaminophen (Tylenol) as needed for fever.   Reduce your risk of any infection by using the same precautions used for avoiding the common cold or flu:  Marland Kitchen Wash your hands often with soap and warm water for at least 20 seconds.  If soap and water are not readily available, use an alcohol-based hand sanitizer with at least 60% alcohol.  . If coughing or sneezing, cover your mouth and nose by coughing or sneezing into the elbow areas of your shirt or coat, into a tissue or into your sleeve (not your hands). . Avoid shaking hands with others and consider head nods or verbal greetings only. . Avoid touching your eyes, nose, or mouth with unwashed hands.  . Avoid close contact with people who are sick. . Avoid places or events with large numbers of people in one location, like concerts or sporting events. . Carefully consider travel plans you have or are making. . If you are planning any travel outside or inside the [place], visit the CDC's Travelers' Health webpage for the latest health notices. . If you have some symptoms but not all symptoms, continue to monitor at home and seek medical attention if your symptoms worsen. . If you are having a medical emergency, call 911.  HOME CARE . Only take medications as instructed by your medical team. .  Drink plenty of fluids and get plenty of rest. . A steam or ultrasonic humidifier can help if you have congestion.   GET HELP RIGHT AWAY IF YOU HAVE EMERGENCY WARNING SIGNS** FOR COVID-19. If you or someone is showing any of these signs seek emergency medical care immediately. Call 911 or proceed to your closest emergency facility if: . You develop worsening high fever. . Trouble breathing . Bluish lips or face . Persistent pain or pressure in the chest . New confusion . Inability to wake or stay  awake . You cough up blood. . Your symptoms become more severe  **This list is not all possible symptoms. Contact your medical provider for any symptoms that are sever or concerning to you.   MAKE SURE YOU   Understand these instructions.  Will watch your condition.  Will get help right away if you are not doing well or get worse.  Your e-visit answers were reviewed by a board certified advanced clinical practitioner to complete your personal care plan.  Depending on the condition, your plan could have included both over the counter or prescription medications.  If there is a problem please reply once you have received a response from your provider.  Your safety is important to [place].  If you have drug allergies check your prescription carefully.    You can use MyChart to ask questions about today's visit, request a non-urgent call back, or ask for a work or school excuse for 24 hours related to this e-Visit. If it has been greater than 24 hours you will need to follow up with your provider, or enter a new e-Visit to address those concerns. You will get an e-mail in the next two days asking about your experience.  I hope that your e-visit has been valuable and will speed your recovery. Thank you for using e-visits.  [place] than 5 minutes, yet less than 10 minutes of time have been spent researching, coordinating, and implementing care for this patient today.   Thank you for the details you included in the comment boxes. Those details are very helpful in determining the best course of treatment for you and help [place] to provide the best care.

## 2019-10-28 ENCOUNTER — Other Ambulatory Visit: Payer: Self-pay

## 2019-10-28 ENCOUNTER — Encounter (INDEPENDENT_AMBULATORY_CARE_PROVIDER_SITE_OTHER): Payer: Self-pay

## 2019-10-28 ENCOUNTER — Ambulatory Visit (INDEPENDENT_AMBULATORY_CARE_PROVIDER_SITE_OTHER)
Admission: RE | Admit: 2019-10-28 | Discharge: 2019-10-28 | Disposition: A | Discharge status: Home / Self Care | Source: Ambulatory Visit

## 2019-10-28 DIAGNOSIS — Z20822 Contact with and (suspected) exposure to covid-19: Secondary | ICD-10-CM

## 2019-10-28 DIAGNOSIS — Z20828 Contact with and (suspected) exposure to other viral communicable diseases: Secondary | ICD-10-CM

## 2019-10-28 NOTE — Discharge Instructions (Addendum)
Go to  [address] for COVID testing  Use precautions and stay quarantined until we get your results  Work note given You can take OTC medications as needed

## 2019-10-28 NOTE — ED Provider Notes (Signed)
.  Virtual Visit via Video Note:  Jaclyn Gray  initiated request for Telemedicine visit with [facility] Urgent Care team. I connected with Jaclyn Gray  on 10/28/2019 at 10:39 AM  for a synchronized telemedicine visit using a video enabled HIPPA compliant telemedicine application. I verified that I am speaking with Jaclyn Gray  using two identifiers. Orvan July, NP  was physically located in a [facility] Urgent care site and Graycee Sphar was located at a different location.   The limitations of evaluation and management by telemedicine as well as the availability of in-person appointments were discussed. Patient was informed that she  may incur a bill ( including co-pay) for this virtual visit encounter. [name]  expressed understanding and gave verbal consent to proceed with virtual visit.     History of Present Illness:Jaclyn Gray  is a 38 y.o. female presents with weakness, fever, sore throat.  Symptoms have been constant, waxing waning since 10/23/2019.  She works in [employer].  Concerned about Covid.  She has been taking over-the-counter medication with some relief of symptoms.  No cough, chest pain or shortness of breath. She is around a lot of people from different states working in the [employer]. Unsure of exposures.   No past medical history on file.  No Known Allergies      Observations/Objective:VITALS: Per patient if applicable, see vitals. GENERAL: Alert, appears well and in no acute distress. HEENT: Atraumatic, conjunctiva clear, no obvious abnormalities on inspection of external nose and ears. NECK: Normal movements of the head and neck. CARDIOPULMONARY: No increased WOB. Speaking in clear sentences. I:E ratio WNL.  MS: Moves all visible extremities without noticeable abnormality. PSYCH: Pleasant and cooperative, well-groomed. Speech normal rate and rhythm. Affect is appropriate. Insight and judgement are appropriate.  Attention is focused, linear, and appropriate.  NEURO: Oriented as arrived to appointment on time with no prompting. Moves both UE equally.  SKIN: No obvious lesions, wounds, erythema, or cyanosis noted on face or hands.     Assessment and Plan: Viral illness with high suspicion for Covid. Patient sent for Covid testing Recommended quarantine and work note given to return with negative Covid results. Patient understanding and agree. OTC meds as needed.    Follow Up Instructions: Follow up as needed for continued or worsening symptoms     I discussed the assessment and treatment plan with the patient. The patient was provided an opportunity to ask questions and all were answered. The patient agreed with the plan and demonstrated an understanding of the instructions.   The patient was advised to call back or seek an in-person evaluation if the symptoms worsen or if the condition fails to improve as anticipated.     Orvan July, NP  10/28/2019 10:39 AM          Orvan July, NP 10/28/19 1519

## 2019-10-29 ENCOUNTER — Encounter: Admitting: Obstetrics and Gynecology

## 2019-10-29 LAB — NOVEL CORONAVIRUS, NAA: SARS-CoV-2, NAA: NOT DETECTED

## 2019-10-30 ENCOUNTER — Ambulatory Visit (INDEPENDENT_AMBULATORY_CARE_PROVIDER_SITE_OTHER): Admitting: Adult Health

## 2019-10-30 ENCOUNTER — Other Ambulatory Visit: Payer: Self-pay

## 2019-10-30 ENCOUNTER — Encounter: Payer: Self-pay | Admitting: Adult Health

## 2019-10-30 VITALS — HR 78 | Ht 61.0 in | Wt 147.0 lb

## 2019-10-30 DIAGNOSIS — B349 Viral infection, unspecified: Secondary | ICD-10-CM

## 2019-10-30 NOTE — Progress Notes (Signed)
[facility] [place], [place] [address]  Internal MEDICINE  Telephone Visit  Patient Name: Jaclyn Gray  [id number]  [id number]  Date of Service: 10/30/2019  I connected with the patient at [phone] by telephone and verified the patients identity using two identifiers.   I discussed the limitations, risks, security and privacy concerns of performing an evaluation and management service by telephone and the availability of in person appointments. I also discussed with the patient that there may be a patient responsible charge related to the service.  The patient expressed understanding and agrees to proceed.    Chief Complaint  Patient presents with  . Telephone Assessment  . Telephone Screen  . Fever    warm to touch   . Fatigue  . Cough    HPI  PT seen video, she reports a few days of feeling warm, cough, sore throat, fatigue, headache.  She reports having a negative covid test a few days ago. She denies any other symptoms.    Current Medication: Outpatient Encounter Medications as of 10/30/2019  Medication Sig  . albuterol (VENTOLIN HFA) 108 (90 Base) MCG/ACT inhaler Inhale 2 puffs into the lungs every 6 (six) hours as needed for wheezing or shortness of breath.  . benzonatate (TESSALON) 100 MG capsule Take 1-2 capsules (100-200 mg total) by mouth 3 (three) times daily as needed for cough.  . cyclobenzaprine (FLEXERIL) 10 MG tablet TAKE HALF TO ONE TAB PO QHS FOR NECK PAIN  . meloxicam (MOBIC) 15 MG tablet Take 1 tablet (15 mg total) by mouth daily. WITH FOOD FOR PAIN   No facility-administered encounter medications on file as of 10/30/2019.     Surgical History: Past Surgical History:  Procedure Laterality Date  . none      Medical History: History reviewed. No pertinent past medical history.  Family History: Family History  Problem Relation Age of Onset  . Hypertension Mother   . Breast cancer Mother 69  . Prostate cancer Father   .  Hypertension Father   . Diabetes Father   . Throat cancer Maternal Grandmother   . Colon cancer Maternal Grandmother   . Lung cancer Maternal Grandfather     Social History   Socioeconomic History  . Marital status: Single    Spouse name: Not on file  . Number of children: Not on file  . Years of education: Not on file  . Highest education level: Not on file  Occupational History  . Not on file  Social Needs  . Financial resource strain: Not on file  . Food insecurity    Worry: Not on file    Inability: Not on file  . Transportation needs    Medical: Not on file    Non-medical: Not on file  Tobacco Use  . Smoking status: Never Smoker  . Smokeless tobacco: Never Used  Substance and Sexual Activity  . Alcohol use: Yes    Comment: social   . Drug use: Never  . Sexual activity: Not on file  Lifestyle  . Physical activity    Days per week: Not on file    Minutes per session: Not on file  . Stress: Not on file  Relationships  . Social Herbalist on phone: Not on file    Gets together: Not on file    Attends religious service: Not on file    Active member of club or organization: Not on file    Attends meetings of  clubs or organizations: Not on file    Relationship status: Not on file  . Intimate partner violence    Fear of current or ex partner: Not on file    Emotionally abused: Not on file    Physically abused: Not on file    Forced sexual activity: Not on file  Other Topics Concern  . Not on file  Social History Narrative  . Not on file      Review of Systems  Constitutional: Positive for chills, fatigue and fever. Negative for unexpected weight change.  HENT: Positive for sore throat. Negative for congestion, rhinorrhea and sneezing.   Eyes: Negative for photophobia, pain and redness.  Respiratory: Positive for cough. Negative for chest tightness and shortness of breath.   Cardiovascular: Negative for chest pain and palpitations.   Gastrointestinal: Negative for abdominal pain, constipation, diarrhea, nausea and vomiting.  Endocrine: Negative.   Genitourinary: Negative for dysuria and frequency.  Musculoskeletal: Negative for arthralgias, back pain, joint swelling and neck pain.  Skin: Negative for rash.  Allergic/Immunologic: Negative.   Neurological: Negative for tremors and numbness.  Hematological: Negative for adenopathy. Does not bruise/bleed easily.  Psychiatric/Behavioral: Negative for behavioral problems and sleep disturbance. The patient is not nervous/anxious.     Vital Signs: Pulse 78   Ht 5\' 1"  (1.549 m)   Wt 147 lb (66.7 kg)   BMI 27.78 kg/m    Observation/Objective: NAD noted, speaking in full sentences.     Assessment/Plan: 1. Acute viral syndrome Pt encouraged to use OTC medication as discussed.  If symptoms worsen or fail to improve, pt will return or call clinic.   General Counseling: Chole verbalizes understanding of the findings of today's phone visit and agrees with plan of treatment. I have discussed any further diagnostic evaluation that may be needed or ordered today. We also reviewed her medications today. she has been encouraged to call the office with any questions or concerns that should arise related to todays visit.    No orders of the defined types were placed in this encounter.   No orders of the defined types were placed in this encounter.   Time spent: [place] AGNP-C Internal medicine

## 2019-11-19 ENCOUNTER — Other Ambulatory Visit: Payer: Self-pay | Admitting: Physician Assistant

## 2019-12-15 ENCOUNTER — Encounter: Admitting: Obstetrics and Gynecology

## 2019-12-16 ENCOUNTER — Telehealth: Payer: Self-pay | Admitting: Obstetrics & Gynecology

## 2019-12-16 NOTE — Telephone Encounter (Signed)
[facility] referring to discuss issues with fertility nd pregnancy. Called and left voicemail for patient to call back to be schedule

## 2020-01-09 ENCOUNTER — Encounter: Admitting: Obstetrics and Gynecology

## 2020-02-11 ENCOUNTER — Encounter: Payer: Self-pay | Admitting: Obstetrics and Gynecology

## 2020-02-11 ENCOUNTER — Ambulatory Visit: Admitting: Obstetrics and Gynecology

## 2020-02-11 ENCOUNTER — Other Ambulatory Visit: Payer: Self-pay

## 2020-02-11 VITALS — BP 120/80 | Ht 61.0 in | Wt 162.0 lb

## 2020-02-11 DIAGNOSIS — Z3189 Encounter for other procreative management: Secondary | ICD-10-CM | POA: Diagnosis not present

## 2020-02-11 DIAGNOSIS — Z319 Encounter for procreative management, unspecified: Secondary | ICD-10-CM

## 2020-02-11 NOTE — Patient Instructions (Signed)
Intrauterine Insemination Intrauterine insemination (IUI) is a procedure to help a woman become pregnant. IUI is also called artificial insemination. A woman becomes pregnant when a sperm meets and fertilizes her egg. With IUI, healthy sperm are placed directly into the uterus. This increases the chances of pregnancy. IUI is done when you are fertile. You can be fertile:  According to your natural ovulation cycle.  After you take medicines to make your body release an egg (ovulation induction). You may choose IUI if:  The cause of infertility is not known. IUI may be the first treatment.  The lower part of your uterus (cervix) is narrow or produces thick mucus.  Your female partner has problems with making and releasing sperm.  You want to get pregnant without a female partner. What are the risks? Generally, this is a safe procedure. However, problems may occur, including:  Infection.  Complications from a pregnancy with more than one baby. Ovulation induction may make your body produce multiple eggs. This increases your chances of having twins, triplets, or other multiples. Carrying multiple babies increases your risk for some pregnancy complications, such as having an early labor and having low birth weight babies. What happens before the procedure? What happens before the procedure depends on your condition and your female partner's condition, if this applies.  If you are having ovulation induction, you will take medicines before the procedure. This may include: ? An oral medicine to stimulate the release of an egg from your ovaries. You may release several eggs at one time. ? An injection of a hormone (human chorionic gonadotropin, or hCG) that makes eggs mature and release from your ovaries.  The best time to do IUI is 24-36 hours after ovulation, depending on whether you take medicine to trigger ovulation. To confirm ovulation and time of the procedure, you may need to: ? Use an  ovulation detection kit. This is a test you do at home. ? Have ultrasound imaging and a blood test to detect ovulation.  If your female partner is giving a sperm sample, he may be asked to come in about 2 hours before the procedure to donate the sample. The sperm sample will be washed free of spermatic fluid, and healthy sperm will be concentrated for the procedure.  If you are using a frozen sample, including using donor sperm, the sperm will be unfrozen and prepared. What happens during the procedure?   You will undress from the waist down and lie on a procedure table. You will put your feet up in stirrups and be covered with a sheet.  Your health care provider will use an instrument to open your vagina (speculum) and clean your cervix.  A syringe containing the sperm sample will be attached to a thin, flexible tube (catheter).  The catheter will be passed through your vagina and cervix and into your uterus.  Sperm will be deposited in your uterus.  The catheter and speculum will be removed. The procedure may vary among health care providers and clinics. What can I expect after the procedure?  You will need to lie still on your back for about 15 minutes on the procedure table.  You will be able to drive home and return to your usual activities.  You may have some light vaginal bleeding (spotting) or cramping after the procedure. Follow these instructions at home:  Take over-the-counter and prescription medicines only as told by your health care provider.  Return to your normal activities as told by your health  care provider. Ask your health care provider what activities are safe for you.  Tell your health care provider if you have persistent bleeding, severe cramps, vaginal discharge, chills, or a fever.  In about 2 weeks, you may be asked to do a home pregnancy test or to return to your health care provider for a pregnancy blood test.  Keep all follow-up visits as told by your  health care provider. This is important. Summary  Intrauterine insemination (IUI) increases your chances of becoming pregnant. The procedure involves placing healthy sperm directly into your uterus through a thin tube (catheter) that is inserted through the vagina and cervix.  You may have IUI after taking medicines to stimulate the release of an egg from your ovaries (ovulation induction).  For IUI, you can use donor sperm or sperm from your partner.  It is normal to have light vaginal bleeding or cramping after the procedure. Tell your health care provider if you have persistent bleeding, severe cramps, vaginal discharge, chills, or a fever.  In about 2 weeks, you may be asked to do a home pregnancy test or to return to your health care provider for a pregnancy blood test. This information is not intended to replace advice given to you by your health care provider. Make sure you discuss any questions you have with your health care provider. Document Revised: 12/04/2018 Document Reviewed: 12/06/2018 Elsevier Patient Education  2020 [employer].

## 2020-02-11 NOTE — Progress Notes (Signed)
Patient ID: Jaclyn Gray, female   DOB: [birth year], 39 y.o.   MRN: [id number]  Reason for Consult: Referral (Wanting to conceive, has never been pregnant, same sex partner )   Referred by Ronnell Freshwater, NP  Subjective:     HPI:  Jaclyn Gray is a 39 y.o. female   Jaclyn Gray presents today for a infertility consultation.  She reports that she would like to conceive.  She is currently in a same-sex relationship.  She is uncertain if her partner would like a child but she would definitely like to be pregnant.  She has been looking into sperm banks and is interested in home insemination.  She says that this is available through the [place] sperm [facility].  She reports that as part of the process for receiving the home insemination kit she needs a OB/GYN.  She says that she will need some lab work and the sperm bank needs to send the specimen to the doctor's office.  She has never tried to conceive in the past.  She has never been pregnant before.  She denies any history of sexually transmitted infections.  She denies any history of fibroids.  She denies any history of ovarian cyst.  She has not yet initiated a prenatal vitamin.   Gynecological History  LMP: 01/16/2020 Describes periods as regular monthly with 3 days of moderate bleeding Last pap smear: NIL, HPV + History of STDs: denies Sexually Active: yes - same sex relationship  Obstetrical History [id number]  History reviewed. No pertinent past medical history. Family History  Problem Relation Age of Onset  . Hypertension Mother   . Breast cancer Mother 82  . Prostate cancer Father   . Hypertension Father   . Diabetes Father   . Throat cancer Maternal Grandmother   . Colon cancer Maternal Grandmother   . Lung cancer Maternal Grandfather    Past Surgical History:  Procedure Laterality Date  . none    . WISDOM TOOTH EXTRACTION     x4 extracted     Short Social History:  Social History   Tobacco Use  . Smoking  status: Never Smoker  . Smokeless tobacco: Never Used  Substance Use Topics  . Alcohol use: Yes    Comment: social     No Known Allergies  No current outpatient medications on file.   No current facility-administered medications for this visit.    Review of Systems  Constitutional: Negative for chills, fatigue, fever and unexpected weight change.  HENT: Negative for trouble swallowing.  Eyes: Negative for loss of vision.  Respiratory: Negative for cough, shortness of breath and wheezing.  Cardiovascular: Negative for chest pain, leg swelling, palpitations and syncope.  GI: Negative for abdominal pain, blood in stool, diarrhea, nausea and vomiting.  GU: Negative for difficulty urinating, dysuria, frequency and hematuria.  Musculoskeletal: Negative for back pain, leg pain and joint pain.  Skin: Negative for rash.  Neurological: Negative for dizziness, headaches, light-headedness, numbness and seizures.  Psychiatric: Negative for behavioral problem, confusion, depressed mood and sleep disturbance.        Objective:  Objective   Vitals:   02/11/20 1332  BP: 120/80  Weight: 162 lb (73.5 kg)  Height: _0  (1.549 m)   Body mass index is 30.61 kg/m.  Physical Exam Vitals and nursing note reviewed.  Constitutional:      Appearance: She is well-developed.  HENT:     Head: Normocephalic and atraumatic.  Eyes:     Pupils: Pupils are  equal, round, and reactive to light.  Cardiovascular:     Rate and Rhythm: Normal rate and regular rhythm.  Pulmonary:     Effort: Pulmonary effort is normal. No respiratory distress.  Skin:    General: Skin is warm and dry.  Neurological:     Mental Status: She is alert and oriented to person, place, and time.  Psychiatric:        Behavior: Behavior normal.        Thought Content: Thought content normal.        Judgment: Judgment normal.    Assessment/Plan:     39 year old in a same-sex relationship who desires  pregnancy  Discussed options for conception including intrauterine inception and IVF.  Discussed general tracking of the menstrual cycle and fertility window.  Discussed usage of home ovulation kits especially with assistance in becoming pregnant.  Jaclyn Gray is interested in home insemination.  Discussed that we generally do not receive semen specimens.  That this is generally done by the reproductive endocrinologist and infertility physician.  Although if this is something that we could facilitate we would be happy to help.  Discussed that we do not have equipment to properly store semen on premises.  Gave Jaclyn Gray information for the reproductive endocrinologist at [facility] and placed a referral.   Jaclyn Gray reports that she may have some lab requirements through the [place] sperm bank.  If this is the case we can order such labs.  Recommended initiation of the prenatal vitamin discussed how taking this medicine for 3 months and before conception can help reduce nausea and had an added benefits for the pregnancy.  Patient was provided with handout information regarding nutrition and pregnancy as well as being pregnant over the age of 81.   More than 35 minutes were spent face to face with the patient in the room with more than 50% of the time spent providing counseling and discussing the plan of management.     Adrian Prows MD [facility], [facility] 02/11/2020 2:21 PM

## 2020-02-13 ENCOUNTER — Other Ambulatory Visit: Payer: Self-pay

## 2020-02-17 ENCOUNTER — Ambulatory Visit: Admitting: Adult Health

## 2020-05-18 ENCOUNTER — Telehealth: Payer: Self-pay

## 2020-05-18 NOTE — Telephone Encounter (Signed)
Confirmed appointment on 05/20/2020 and screened for covid. [facility] 

## 2020-05-20 ENCOUNTER — Ambulatory Visit: Admitting: Adult Health

## 2020-05-21 ENCOUNTER — Encounter: Payer: Self-pay | Admitting: Nurse Practitioner

## 2020-05-21 ENCOUNTER — Ambulatory Visit (INDEPENDENT_AMBULATORY_CARE_PROVIDER_SITE_OTHER): Admitting: Nurse Practitioner

## 2020-05-21 ENCOUNTER — Other Ambulatory Visit: Payer: Self-pay

## 2020-05-21 VITALS — BP 132/86 | HR 82 | Temp 97.4°F | Resp 16 | Ht 61.0 in | Wt 156.8 lb

## 2020-05-21 DIAGNOSIS — M25511 Pain in right shoulder: Secondary | ICD-10-CM | POA: Diagnosis not present

## 2020-05-21 DIAGNOSIS — F411 Generalized anxiety disorder: Secondary | ICD-10-CM

## 2020-05-21 DIAGNOSIS — R141 Gas pain: Secondary | ICD-10-CM

## 2020-05-21 NOTE — Progress Notes (Signed)
[facility] [place], [place] [address]  Internal MEDICINE  Office Visit Note  Patient Name: Jaclyn Gray  [id number]  [id number]  Date of Service: 05/24/2020   Pt is here for a sick visit.  Chief Complaint  Patient presents with  . Shoulder Pain    pushing a file cabinet in october, sore afterwards, now its rickety, popping   . GI Problem    makes a lot of gurgling noise, sometimes gets diarrhea, intermittent      The patient is here for acute visit. She is having right shoulder pain. This has been going on since October. Started after she was pushing a heavy file cabinet, using the right shoulder for leverage. She states that she does have some weakness. ROM is not limited, however, movement does hurt the shoulder. She can feel crepitus when she moves the right shoulder. She does not take any medication to help but she does use topical pain relievers. This does help a little. She does not want to take oral medications or have x-ray at this time. She is trying to get pregnant and does not want anything to interfere with this process.  She states that she also having GI issues. She states that the intestines "talk to her" all day and night. Does not hurt. She has had a couple of episodes of diarrhea. These have been very spread out over the past several weeks. She has occasional gas, but no other cramping or abdominal pain. She denies nausea or vomiting. She is currently unable to relate food intake to symptoms getting worse or improving.  Patient states that she is starting to have some physiological responses to stress. This is much more severe around the time of her menstrual cycles. Gets nauseated, headaches, heart pounds, and sometimes, she gets dizzy. She is looking for some tools to help her deal with this stress more efectively.        Current Medication:  No outpatient encounter medications on file as of 05/21/2020.   No facility-administered  encounter medications on file as of 05/21/2020.      Medical History: History reviewed. No pertinent past medical history.   Today's Vitals   05/21/20 0846  BP: 132/86  Pulse: 82  Resp: 16  Temp: (!) 97.4 F (36.3 C)  SpO2: 99%  Weight: 156 lb 12.8 oz (71.1 kg)  Height: 5\' 1"  (1.549 m)   Body mass index is 29.63 kg/m.  Review of Systems  Constitutional: Negative for activity change, chills, fatigue and unexpected weight change.  HENT: Negative for congestion, postnasal drip, rhinorrhea, sneezing and sore throat.   Respiratory: Negative for cough, chest tightness, shortness of breath and wheezing.   Cardiovascular: Negative for chest pain and palpitations.  Gastrointestinal: Positive for constipation. Negative for abdominal pain, diarrhea, nausea and vomiting.       Increased gas. Overactive bowel sounds.   Endocrine: Negative for cold intolerance, heat intolerance, polydipsia and polyuria.  Musculoskeletal: Positive for arthralgias and myalgias. Negative for back pain, joint swelling and neck pain.       Right shoulder pain.  More severe when reaching behind her.   Skin: Negative for rash.  Allergic/Immunologic: Negative for environmental allergies.  Neurological: Negative for dizziness, tremors, numbness and headaches.  Hematological: Negative for adenopathy. Does not bruise/bleed easily.  Psychiatric/Behavioral: Negative for behavioral problems (Depression), sleep disturbance and suicidal ideas. The patient is nervous/anxious.     Physical Exam Vitals and nursing note reviewed.  Constitutional:  General: She is not in acute distress.    Appearance: Normal appearance. She is well-developed. She is not diaphoretic.  HENT:     Head: Normocephalic and atraumatic.     Nose: Nose normal.     Mouth/Throat:     Pharynx: No oropharyngeal exudate.  Eyes:     Pupils: Pupils are equal, round, and reactive to light.  Neck:     Thyroid: No thyromegaly.     Vascular: No  JVD.     Trachea: No tracheal deviation.  Cardiovascular:     Rate and Rhythm: Normal rate and regular rhythm.     Heart sounds: Normal heart sounds. No murmur. No friction rub. No gallop.   Pulmonary:     Effort: Pulmonary effort is normal. No respiratory distress.     Breath sounds: Normal breath sounds. No wheezing or rales.  Chest:     Chest wall: No tenderness.  Abdominal:     General: Bowel sounds are normal.     Palpations: Abdomen is soft.     Tenderness: There is no abdominal tenderness.  Musculoskeletal:        General: Normal range of motion.     Cervical back: Normal range of motion and neck supple.     Comments: ROM and strength or right shoulder are slightly decreased due to pain. No palpable abnormalities or deformities are noted.    Lymphadenopathy:     Cervical: No cervical adenopathy.  Skin:    General: Skin is warm and dry.  Neurological:     Mental Status: She is alert and oriented to person, place, and time.     Cranial Nerves: No cranial nerve deficit.  Psychiatric:        Mood and Affect: Mood normal.        Behavior: Behavior normal.        Thought Content: Thought content normal.        Judgment: Judgment normal.    Assessment/Plan: 1. Right anterior shoulder pain Right shoulder pain, especially with reaching behind her back. Will refer to orthopedics for further evaluation and treatment.  - Ambulatory referral to Orthopedic Surgery  2. Gas pain Asked patient to keep food journal to connect diet with symptoms. Will discuss with patient at next visit.   3. Generalized anxiety disorder Discussed coping mechanisms to help her cope with stress, especially at work. Discussed several apps and techniques which will help her control emotions.   General Counseling: Nasirah verbalizes understanding of the findings of todays visit and agrees with plan of treatment. I have discussed any further diagnostic evaluation that may be needed or ordered today. We  also reviewed her medications today. she has been encouraged to call the office with any questions or concerns that should arise related to todays visit.    Counseling:  This patient was seen by Leretha Pol FNP Collaboration with Dr Lavera Guise as a part of collaborative care agreement  Orders Placed This Encounter  Procedures  . Ambulatory referral to Orthopedic Surgery      Time spent: 30 Minutes

## 2020-05-22 ENCOUNTER — Encounter: Payer: Self-pay | Admitting: Nurse Practitioner

## 2020-05-24 DIAGNOSIS — F411 Generalized anxiety disorder: Secondary | ICD-10-CM | POA: Insufficient documentation

## 2020-05-24 DIAGNOSIS — M25511 Pain in right shoulder: Secondary | ICD-10-CM | POA: Insufficient documentation

## 2020-05-24 DIAGNOSIS — R141 Gas pain: Secondary | ICD-10-CM | POA: Insufficient documentation

## 2020-05-24 DIAGNOSIS — G8929 Other chronic pain: Secondary | ICD-10-CM | POA: Insufficient documentation

## 2020-05-24 HISTORY — DX: Generalized anxiety disorder: F41.1

## 2020-05-25 NOTE — Telephone Encounter (Signed)
Can we go ahead and do a referral to counseling services for her since her insurance requires this? Thanks.

## 2020-06-02 ENCOUNTER — Encounter: Payer: Self-pay | Admitting: Nurse Practitioner

## 2020-06-04 ENCOUNTER — Encounter: Payer: Self-pay | Admitting: Nurse Practitioner

## 2020-07-01 ENCOUNTER — Encounter: Payer: Self-pay | Admitting: Adult Health

## 2020-07-01 ENCOUNTER — Ambulatory Visit (INDEPENDENT_AMBULATORY_CARE_PROVIDER_SITE_OTHER): Admitting: Adult Health

## 2020-07-01 ENCOUNTER — Other Ambulatory Visit: Payer: Self-pay

## 2020-07-01 VITALS — BP 122/82 | HR 77 | Temp 98.0°F | Resp 16 | Ht 61.0 in | Wt 164.6 lb

## 2020-07-01 DIAGNOSIS — Z3490 Encounter for supervision of normal pregnancy, unspecified, unspecified trimester: Secondary | ICD-10-CM

## 2020-07-01 DIAGNOSIS — M25511 Pain in right shoulder: Secondary | ICD-10-CM

## 2020-07-01 DIAGNOSIS — F4322 Adjustment disorder with anxiety: Secondary | ICD-10-CM | POA: Diagnosis not present

## 2020-07-01 NOTE — Progress Notes (Signed)
[facility] [place], [place] [address]  Internal MEDICINE  Office Visit Note  Patient Name: Jaclyn Gray  [id number]  [id number]  Date of Service: 07/06/2020  Chief Complaint  Patient presents with  . Follow-up    HPI   PT is here for general follow up.  She is doing well.  Her anxiety is well controlled and she continues to se counselor. She was recently seen for right shoulder pain, and she Is in physical therapy for this. She reports she has been trying to get pregnant and has been 8 weeks without her period.  She took home pregnancy test and is positive.  She is also positive in office today, and she is requesting a Quantitative HCG.          Current Medication: No outpatient encounter medications on file as of 07/01/2020.   No facility-administered encounter medications on file as of 07/01/2020.    Surgical History: Past Surgical History:  Procedure Laterality Date  . none    . WISDOM TOOTH EXTRACTION     x4 extracted     Medical History: History reviewed. No pertinent past medical history.  Family History: Family History  Problem Relation Age of Onset  . Hypertension Mother   . Breast cancer Mother 15  . Prostate cancer Father   . Hypertension Father   . Diabetes Father   . Throat cancer Maternal Grandmother   . Colon cancer Maternal Grandmother   . Lung cancer Maternal Grandfather     Social History   Socioeconomic History  . Marital status: Soil scientist    Spouse name: Not on file  . Number of children: Not on file  . Years of education: Not on file  . Highest education level: Not on file  Occupational History  . Not on file  Tobacco Use  . Smoking status: Never Smoker  . Smokeless tobacco: Never Used  Vaping Use  . Vaping Use: Never used  Substance and Sexual Activity  . Alcohol use: Yes    Comment: social   . Drug use: Never  . Sexual activity: Yes    Birth control/protection: None  Other Topics Concern   . Not on file  Social History Narrative  . Not on file   Social Determinants of Health   Financial Resource Strain:   . Difficulty of Paying Living Expenses:   Food Insecurity:   . Worried About Charity fundraiser in the Last Year:   . Arboriculturist in the Last Year:   Transportation Needs:   . Film/video editor (Medical):   Marland Kitchen Lack of Transportation (Non-Medical):   Physical Activity:   . Days of Exercise per Week:   . Minutes of Exercise per Session:   Stress:   . Feeling of Stress :   Social Connections:   . Frequency of Communication with Friends and Family:   . Frequency of Social Gatherings with Friends and Family:   . Attends Religious Services:   . Active Member of Clubs or Organizations:   . Attends Archivist Meetings:   Marland Kitchen Marital Status:   Intimate Partner Violence:   . Fear of Current or Ex-Partner:   . Emotionally Abused:   Marland Kitchen Physically Abused:   . Sexually Abused:       Review of Systems  Constitutional: Negative for chills, fatigue and unexpected weight change.  HENT: Negative for congestion, rhinorrhea, sneezing and sore throat.   Eyes: Negative for photophobia,  pain and redness.  Respiratory: Negative for cough, chest tightness and shortness of breath.   Cardiovascular: Negative for chest pain and palpitations.  Gastrointestinal: Negative for abdominal pain, constipation, diarrhea, nausea and vomiting.  Endocrine: Negative.   Genitourinary: Negative for dysuria and frequency.  Musculoskeletal: Negative for arthralgias, back pain, joint swelling and neck pain.  Skin: Negative for rash.  Allergic/Immunologic: Negative.   Neurological: Negative for tremors and numbness.  Hematological: Negative for adenopathy. Does not bruise/bleed easily.  Psychiatric/Behavioral: Negative for behavioral problems and sleep disturbance. The patient is not nervous/anxious.     Vital Signs: BP 122/82   Pulse 77   Temp 98 F (36.7 C)   Resp 16    Ht 5\' 1"  (1.549 m)   Wt 164 lb 9.6 oz (74.7 kg)   SpO2 100%   BMI 31.10 kg/m    Physical Exam Vitals and nursing note reviewed.  Constitutional:      General: She is not in acute distress.    Appearance: She is well-developed. She is not diaphoretic.  HENT:     Head: Normocephalic and atraumatic.     Mouth/Throat:     Pharynx: No oropharyngeal exudate.  Eyes:     Pupils: Pupils are equal, round, and reactive to light.  Neck:     Thyroid: No thyromegaly.     Vascular: No JVD.     Trachea: No tracheal deviation.  Cardiovascular:     Rate and Rhythm: Normal rate and regular rhythm.     Heart sounds: Normal heart sounds. No murmur heard.  No friction rub. No gallop.   Pulmonary:     Effort: Pulmonary effort is normal. No respiratory distress.     Breath sounds: Normal breath sounds. No wheezing or rales.  Chest:     Chest wall: No tenderness.  Abdominal:     Palpations: Abdomen is soft.     Tenderness: There is no abdominal tenderness. There is no guarding.  Musculoskeletal:        General: Normal range of motion.     Cervical back: Normal range of motion and neck supple.  Lymphadenopathy:     Cervical: No cervical adenopathy.  Skin:    General: Skin is warm and dry.  Neurological:     Mental Status: She is alert and oriented to person, place, and time.     Cranial Nerves: No cranial nerve deficit.  Psychiatric:        Behavior: Behavior normal.        Thought Content: Thought content normal.        Judgment: Judgment normal.    Assessment/Plan: 1. Adjustment disorder with anxiety Stable, continue to follow up with counselor as prescribed.   2. Right anterior shoulder pain Stable, continue current management.   3. Pregnancy, unspecified gestational age - POCT urine pregnancy - B-HCG Quant  General Counseling: Sita verbalizes understanding of the findings of todays visit and agrees with plan of treatment. I have discussed any further diagnostic evaluation  that may be needed or ordered today. We also reviewed her medications today. she has been encouraged to call the office with any questions or concerns that should arise related to todays visit.    Orders Placed This Encounter  Procedures  . B-HCG Quant  . Beta HCG, Quant  . Ambulatory referral to Obstetrics / Gynecology  . POCT urine pregnancy    No orders of the defined types were placed in this encounter.   Time spent: 25 Minutes  This patient was seen by Orson Gear AGNP-C in Collaboration with Dr Lavera Guise as a part of collaborative care agreement     Kendell Bane AGNP-C Internal medicine

## 2020-07-08 ENCOUNTER — Telehealth: Payer: Self-pay

## 2020-07-08 NOTE — Telephone Encounter (Signed)
Left message and advised patient form ready for pick up and per provider it would be good to have another copy filled out and give to her OBGYN. [name]

## 2020-07-19 NOTE — Progress Notes (Signed)
SCANNED IN PT EMPLOYEE FORM. [name]

## 2020-07-21 ENCOUNTER — Other Ambulatory Visit: Payer: Self-pay

## 2020-07-21 ENCOUNTER — Ambulatory Visit (INDEPENDENT_AMBULATORY_CARE_PROVIDER_SITE_OTHER): Admitting: Obstetrics & Gynecology

## 2020-07-21 ENCOUNTER — Encounter: Payer: Self-pay | Admitting: Obstetrics & Gynecology

## 2020-07-21 ENCOUNTER — Other Ambulatory Visit (HOSPITAL_COMMUNITY)
Admission: RE | Admit: 2020-07-21 | Discharge: 2020-07-21 | Disposition: A | Discharge status: Home / Self Care | Source: Ambulatory Visit | Attending: Obstetrics & Gynecology | Admitting: Obstetrics & Gynecology

## 2020-07-21 VITALS — BP 132/85 | HR 96 | Wt 163.0 lb

## 2020-07-21 DIAGNOSIS — O0990 Supervision of high risk pregnancy, unspecified, unspecified trimester: Secondary | ICD-10-CM | POA: Insufficient documentation

## 2020-07-21 DIAGNOSIS — E669 Obesity, unspecified: Secondary | ICD-10-CM

## 2020-07-21 DIAGNOSIS — R87810 Cervical high risk human papillomavirus (HPV) DNA test positive: Secondary | ICD-10-CM | POA: Insufficient documentation

## 2020-07-21 DIAGNOSIS — O99210 Obesity complicating pregnancy, unspecified trimester: Secondary | ICD-10-CM

## 2020-07-21 DIAGNOSIS — Z3A11 11 weeks gestation of pregnancy: Secondary | ICD-10-CM

## 2020-07-21 DIAGNOSIS — O09519 Supervision of elderly primigravida, unspecified trimester: Secondary | ICD-10-CM | POA: Insufficient documentation

## 2020-07-21 DIAGNOSIS — Z3401 Encounter for supervision of normal first pregnancy, first trimester: Secondary | ICD-10-CM

## 2020-07-21 DIAGNOSIS — O09511 Supervision of elderly primigravida, first trimester: Secondary | ICD-10-CM

## 2020-07-21 DIAGNOSIS — Z3687 Encounter for antenatal screening for uncertain dates: Secondary | ICD-10-CM | POA: Diagnosis not present

## 2020-07-21 DIAGNOSIS — O3441 Maternal care for other abnormalities of cervix, first trimester: Secondary | ICD-10-CM

## 2020-07-21 DIAGNOSIS — O09811 Supervision of pregnancy resulting from assisted reproductive technology, first trimester: Secondary | ICD-10-CM

## 2020-07-21 NOTE — Progress Notes (Signed)
History:   Jaclyn Gray is a 39 y.o. G1P0000 at [redacted]w[redacted]d by LMP, early ultrasound being seen today for her first obstetrical visit.  Her obstetrical history is significant for conceiving with aid of a sperm donor, he is not going to be involved. Patient does intend to breast feed. Pregnancy history fully reviewed.  Patient reports no complaints.  Of note, she is in the [employer] Education officer, community) and may have her posting changed during her pregnancy to [place], [place].      HISTORY: OB History  Gravida Para Term Preterm AB Living  1 0 0 0 0 0  SAB TAB Ectopic Multiple Live Births  0 0 0 0 0    # Outcome Date GA Lbr Len/2nd Weight Sex Delivery Anes PTL Lv  1 Current             Last pap smear was done 07/18/2019 and was normal cytology but +HRHPV.  Past Medical History:  Diagnosis Date  . Medical history non-contributory    Past Surgical History:  Procedure Laterality Date  . WISDOM TOOTH EXTRACTION     x4 extracted    Family History  Problem Relation Age of Onset  . Hypertension Mother   . Breast cancer Mother 46  . Prostate cancer Father   . Hypertension Father   . Diabetes Father   . Throat cancer Maternal Grandmother   . Colon cancer Maternal Grandmother   . Lung cancer Maternal Grandfather    Social History   Tobacco Use  . Smoking status: Never Smoker  . Smokeless tobacco: Never Used  Vaping Use  . Vaping Use: Never used  Substance Use Topics  . Alcohol use: Yes    Comment: social   . Drug use: Never   No Known Allergies Current Outpatient Medications on File Prior to Visit  Medication Sig Dispense Refill  . Prenatal Vit-Fe Fumarate-FA (MULTIVITAMIN-PRENATAL) 27-0.8 MG TABS tablet Take 1 tablet by mouth daily at 12 noon.     No current facility-administered medications on file prior to visit.    Review of Systems Pertinent items noted in HPI and remainder of comprehensive ROS otherwise negative. Physical Exam:   Vitals:   07/21/20 1424  BP: (!) 132/85   Pulse: 96  Weight: 163 lb (73.9 kg)   Fetal Heart Rate (bpm): 164 bpm (see ultrasound note) Uterus:     Pelvic Exam: Perineum: no hemorrhoids, normal perineum   Vulva: normal external genitalia, no lesions   Vagina:  normal mucosa, normal discharge   Cervix: no lesions and normal, pap smear done with some bleeding afterwards ameliorated by pressure and silver nitrate.    Adnexa: not examined   Bony Pelvis: average  System: General: well-developed, well-nourished female in no acute distress   Breasts:  normal appearance, no masses or tenderness bilaterally   Skin: normal coloration and turgor, no rashes   Neurologic: oriented, normal, negative, normal mood   Extremities: normal strength, tone, and muscle mass, ROM of all joints is normal   HEENT PERRLA, extraocular movement intact and sclera clear, anicteric   Neck supple and no masses   Cardiovascular: regular rate and rhythm   Respiratory:  no respiratory distress, normal breath sounds   Abdomen: soft, non-tender; bowel sounds normal; no masses,  no organomegaly    Assessment:    Pregnancy: G1P0000 Patient Active Problem List   Diagnosis Date Noted  . Encounter for supervision of normal first pregnancy 07/21/2020  . AMA (advanced maternal age) primigravida 35+ 07/21/2020  .  Right anterior shoulder pain 05/24/2020  . Gas pain 05/24/2020  . Generalized anxiety disorder 05/24/2020  . 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  . At risk for fertility problems 08/02/2019  . Dysuria 08/02/2019     Plan:    1. Normal pap with positive high risk HPV on 07/18/2019 - Cytology - PAP done, will follow up results and manage accordingly.  2. Obesity in pregnancy, antepartum Recommended 11-20 lb weight gain, proper diet and exercise. She declined Nutritionist referral. - Comprehensive metabolic panel - Hemoglobin A1c - TSH - Protein /  creatinine ratio, urine  3. Primigravida of advanced maternal age in first trimester - Genetic Screening/NIPS done  4. [redacted] weeks gestation of pregnancy 5. Encounter for supervision of normal first pregnancy in first trimester - CBC/D/Plt+RPR+Rh+ABO+Rub Ab... - Culture, OB Urine - Enroll Patient in Babyscripts Initial labs drawn. Continue prenatal vitamins. Problem list reviewed and updated. Genetic Screening discussed, NIPS: ordered. Ultrasound discussed; fetal anatomic survey: to be ordered later. Anticipatory guidance about prenatal visits given including labs, ultrasounds, and testing. Discussed usage of Babyscripts and virtual visits as additional source of managing and completing prenatal visits in midst of coronavirus and pandemic.   Encouraged to complete MyChart Registration for her ability to review results, send requests, and have questions addressed.  The nature of [facility] with multiple MDs and Advanced Practice Providers was explained to patient; also emphasized that residents, students are part of our team. Routine obstetric precautions reviewed. Encouraged to seek out care at office or emergency room [facility] preferred) for urgent and/or emergent concerns. Return in about 4 weeks (around 08/18/2020) for OFFICE OB Visit.     Verita Schneiders, MD, [place] for [employer], [place]

## 2020-07-21 NOTE — Progress Notes (Signed)
DATING AND VIABILITY SONOGRAM   Stefanee Mckell is a 39 y.o. year old G1P0000 with LMP Patient's last menstrual period was 05/04/2020. which would correlate to  [redacted]w[redacted]d weeks gestation.  She has regular menstrual cycles.   She is here today for a confirmatory initial sonogram.    GESTATION: SINGLETON yes     FETAL ACTIVITY:          Heart rate   164          The fetus is active.   GESTATIONAL AGE AND  BIOMETRICS:  Gestational criteria: Estimated Date of Delivery: 02/08/21 by LMP now at [redacted]w[redacted]d  Previous Scans:0  GESTATIONAL SAC            mm          weeks  CROWN RUMP LENGTH           4.96 mm         11.4 weeks                                                   AVERAGE EGA(BY THIS SCAN):  11.4 weeks  WORKING EDD( LMP ):  02/08/2021     TECHNICIAN COMMENTS:   Patient informed that the ultrasound is considered a limited obstetric ultrasound and is not intended to be a complete ultrasound exam. Patient also informed that the ultrasound is not being completed with the intent of assessing for fetal or placental anomalies or any pelvic abnormalities. Explained that the purpose of today's ultrasound is to assess for fetal heart rate. Patient acknowledges the purpose of the exam and the limitations of the study.       A copy of this report including all images has been saved and backed up to a second source for retrieval if needed. All measures and details of the anatomical scan, placentation, fluid volume and pelvic anatomy are contained in that report.  Crosby Oyster 07/21/2020 2:51 PM

## 2020-07-21 NOTE — Patient Instructions (Signed)
First Trimester of Pregnancy The first trimester of pregnancy is from week 1 until the end of week 13 (months 1 through 3). A week after a sperm fertilizes an egg, the egg will implant on the wall of the uterus. This embryo will begin to develop into a baby. Genes from you and your partner will form the baby. The female genes will determine whether the baby will be a boy or a girl. At 6-8 weeks, the eyes and face will be formed, and the heartbeat can be seen on ultrasound. At the end of 12 weeks, all the baby's organs will be formed. Now that you are pregnant, you will want to do everything you can to have a healthy baby. Two of the most important things are to get good prenatal care and to follow your health care provider's instructions. Prenatal care is all the medical care you receive before the baby's birth. This care will help prevent, find, and treat any problems during the pregnancy and childbirth. Body changes during your first trimester Your body goes through many changes during pregnancy. The changes vary from woman to woman.  You may gain or lose a couple of pounds at first.  You may feel sick to your stomach (nauseous) and you may throw up (vomit). If the vomiting is uncontrollable, call your health care provider.  You may tire easily.  You may develop headaches that can be relieved by medicines. All medicines should be approved by your health care provider.  You may urinate more often. Painful urination may mean you have a bladder infection.  You may develop heartburn as a result of your pregnancy.  You may develop constipation because certain hormones are causing the muscles that push stool through your intestines to slow down.  You may develop hemorrhoids or swollen veins (varicose veins).  Your breasts may begin to grow larger and become tender. Your nipples may stick out more, and the tissue that surrounds them (areola) may become darker.  Your gums may bleed and may be  sensitive to brushing and flossing.  Dark spots or blotches (chloasma, mask of pregnancy) may develop on your face. This will likely fade after the baby is born.  Your menstrual periods will stop.  You may have a loss of appetite.  You may develop cravings for certain kinds of food.  You may have changes in your emotions from day to day, such as being excited to be pregnant or being concerned that something may go wrong with the pregnancy and baby.  You may have more vivid and strange dreams.  You may have changes in your hair. These can include thickening of your hair, rapid growth, and changes in texture. Some women also have hair loss during or after pregnancy, or hair that feels dry or thin. Your hair will most likely return to normal after your baby is born. What to expect at prenatal visits During a routine prenatal visit:  You will be weighed to make sure you and the baby are growing normally.  Your blood pressure will be taken.  Your abdomen will be measured to track your baby's growth.  The fetal heartbeat will be listened to between weeks 10 and 14 of your pregnancy.  Test results from any previous visits will be discussed. Your health care provider may ask you:  How you are feeling.  If you are feeling the baby move.  If you have had any abnormal symptoms, such as leaking fluid, bleeding, severe headaches, or abdominal   cramping.  If you are using any tobacco products, including cigarettes, chewing tobacco, and electronic cigarettes.  If you have any questions. Other tests that may be performed during your first trimester include:  Blood tests to find your blood type and to check for the presence of any previous infections. The tests will also be used to check for low iron levels (anemia) and protein on red blood cells (Rh antibodies). Depending on your risk factors, or if you previously had diabetes during pregnancy, you may have tests to check for high blood sugar  that affects pregnant women (gestational diabetes).  Urine tests to check for infections, diabetes, or protein in the urine.  An ultrasound to confirm the proper growth and development of the baby.  Fetal screens for spinal cord problems (spina bifida) and Down syndrome.  HIV (human immunodeficiency virus) testing. Routine prenatal testing includes screening for HIV, unless you choose not to have this test.  You may need other tests to make sure you and the baby are doing well. Follow these instructions at home: Medicines  Follow your health care provider's instructions regarding medicine use. Specific medicines may be either safe or unsafe to take during pregnancy.  Take a prenatal vitamin that contains at least 600 micrograms (mcg) of folic acid.  If you develop constipation, try taking a stool softener if your health care provider approves. Eating and drinking   Eat a balanced diet that includes fresh fruits and vegetables, whole grains, good sources of protein such as meat, eggs, or tofu, and low-fat dairy. Your health care provider will help you determine the amount of weight gain that is right for you.  Avoid raw meat and uncooked cheese. These carry germs that can cause birth defects in the baby.  Eating four or five small meals rather than three large meals a day may help relieve nausea and vomiting. If you start to feel nauseous, eating a few soda crackers can be helpful. Drinking liquids between meals, instead of during meals, also seems to help ease nausea and vomiting.  Limit foods that are high in fat and processed sugars, such as fried and sweet foods.  To prevent constipation: ? Eat foods that are high in fiber, such as fresh fruits and vegetables, whole grains, and beans. ? Drink enough fluid to keep your urine clear or pale yellow. Activity  Exercise only as directed by your health care provider. Most women can continue their usual exercise routine during  pregnancy. Try to exercise for 30 minutes at least 5 days a week. Exercising will help you: ? Control your weight. ? Stay in shape. ? Be prepared for labor and delivery.  Experiencing pain or cramping in the lower abdomen or lower back is a good sign that you should stop exercising. Check with your health care provider before continuing with normal exercises.  Try to avoid standing for long periods of time. Move your legs often if you must stand in one place for a long time.  Avoid heavy lifting.  Wear low-heeled shoes and practice good posture.  You may continue to have sex unless your health care provider tells you not to. Relieving pain and discomfort  Wear a good support bra to relieve breast tenderness.  Take warm sitz baths to soothe any pain or discomfort caused by hemorrhoids. Use hemorrhoid cream if your health care provider approves.  Rest with your legs elevated if you have leg cramps or low back pain.  If you develop varicose veins in   your legs, wear support hose. Elevate your feet for 15 minutes, 3-4 times a day. Limit salt in your diet. Prenatal care  Schedule your prenatal visits by the twelfth week of pregnancy. They are usually scheduled monthly at first, then more often in the last 2 months before delivery.  Write down your questions. Take them to your prenatal visits.  Keep all your prenatal visits as told by your health care provider. This is important. Safety  Wear your seat belt at all times when driving.  Make a list of emergency phone numbers, including numbers for family, friends, the hospital, and police and fire departments. General instructions  Ask your health care provider for a referral to a local prenatal education class. Begin classes no later than the beginning of month 6 of your pregnancy.  Ask for help if you have counseling or nutritional needs during pregnancy. Your health care provider can offer advice or refer you to specialists for help  with various needs.  Do not use hot tubs, steam rooms, or saunas.  Do not douche or use tampons or scented sanitary pads.  Do not cross your legs for long periods of time.  Avoid cat litter boxes and soil used by cats. These carry germs that can cause birth defects in the baby and possibly loss of the fetus by miscarriage or stillbirth.  Avoid all smoking, herbs, alcohol, and medicines not prescribed by your health care provider. Chemicals in these products affect the formation and growth of the baby.  Do not use any products that contain nicotine or tobacco, such as cigarettes and e-cigarettes. If you need help quitting, ask your health care provider. You may receive counseling support and other resources to help you quit.  Schedule a dentist appointment. At home, brush your teeth with a soft toothbrush and be gentle when you floss. Contact a health care provider if:  You have dizziness.  You have mild pelvic cramps, pelvic pressure, or nagging pain in the abdominal area.  You have persistent nausea, vomiting, or diarrhea.  You have a bad smelling vaginal discharge.  You have pain when you urinate.  You notice increased swelling in your face, hands, legs, or ankles.  You are exposed to fifth disease or chickenpox.  You are exposed to German measles (rubella) and have never had it. Get help right away if:  You have a fever.  You are leaking fluid from your vagina.  You have spotting or bleeding from your vagina.  You have severe abdominal cramping or pain.  You have rapid weight gain or loss.  You vomit blood or material that looks like coffee grounds.  You develop a severe headache.  You have shortness of breath.  You have any kind of trauma, such as from a fall or a car accident. Summary  The first trimester of pregnancy is from week 1 until the end of week 13 (months 1 through 3).  Your body goes through many changes during pregnancy. The changes vary from  woman to woman.  You will have routine prenatal visits. During those visits, your health care provider will examine you, discuss any test results you may have, and talk with you about how you are feeling. This information is not intended to replace advice given to you by your health care provider. Make sure you discuss any questions you have with your health care provider. Document Revised: 11/23/2017 Document Reviewed: 11/22/2016 Elsevier Patient Education  2020 Elsevier Inc.  

## 2020-07-22 ENCOUNTER — Telehealth: Payer: Self-pay

## 2020-07-22 LAB — COMPREHENSIVE METABOLIC PANEL
ALT: 9 IU/L (ref 0–32)
AST: 11 IU/L (ref 0–40)
Albumin/Globulin Ratio: 1.5 (ref 1.2–2.2)
Albumin: 4.3 g/dL (ref 3.8–4.8)
Alkaline Phosphatase: 39 IU/L — ABNORMAL LOW (ref 48–121)
BUN/Creatinine Ratio: 12 (ref 9–23)
BUN: 8 mg/dL (ref 6–20)
Bilirubin Total: <0.2 mg/dL (ref 0.0–1.2)
CO2: 22 mmol/L (ref 20–29)
Calcium: 9.9 mg/dL (ref 8.7–10.2)
Chloride: 101 mmol/L (ref 96–106)
Creatinine, Ser: 0.66 mg/dL (ref 0.57–1.00)
GFR calc Af Amer: 130 mL/min/{1.73_m2} (ref >59)
GFR calc non Af Amer: 112 mL/min/{1.73_m2} (ref >59)
Globulin, Total: 2.9 g/dL (ref 1.5–4.5)
Glucose: 61 mg/dL — ABNORMAL LOW (ref 65–99)
Potassium: 3.9 mmol/L (ref 3.5–5.2)
Sodium: 136 mmol/L (ref 134–144)
Total Protein: 7.2 g/dL (ref 6.0–8.5)

## 2020-07-22 LAB — CBC/D/PLT+RPR+RH+ABO+RUB AB...
Antibody Screen: NEGATIVE
Basophils Absolute: 0 10*3/uL (ref 0.0–0.2)
Basos: 0 %
EOS (ABSOLUTE): 0.1 10*3/uL (ref 0.0–0.4)
Eos: 1 %
HCV Ab: <0.1 s/co ratio (ref 0.0–0.9)
HIV Screen 4th Generation wRfx: NONREACTIVE
Hematocrit: 43.1 % (ref 34.0–46.6)
Hemoglobin: 14.5 g/dL (ref 11.1–15.9)
Hepatitis B Surface Ag: NEGATIVE
Immature Grans (Abs): 0.1 10*3/uL (ref 0.0–0.1)
Immature Granulocytes: 1 %
Lymphocytes Absolute: 2.3 10*3/uL (ref 0.7–3.1)
Lymphs: 22 %
MCH: 32.5 pg (ref 26.6–33.0)
MCHC: 33.6 g/dL (ref 31.5–35.7)
MCV: 97 fL (ref 79–97)
Monocytes Absolute: 0.8 10*3/uL (ref 0.1–0.9)
Monocytes: 8 %
Neutrophils Absolute: 7.4 10*3/uL — ABNORMAL HIGH (ref 1.4–7.0)
Neutrophils: 68 %
Platelets: 311 10*3/uL (ref 150–450)
RBC: 4.46 x10E6/uL (ref 3.77–5.28)
RDW: 12.2 % (ref 11.7–15.4)
RPR Ser Ql: NONREACTIVE
Rh Factor: POSITIVE
Rubella Antibodies, IGG: 15.7 index (ref >0.99)
WBC: 10.8 10*3/uL (ref 3.4–10.8)

## 2020-07-22 LAB — PROTEIN / CREATININE RATIO, URINE
Creatinine, Urine: 37.7 mg/dL
Protein, Ur: <4 mg/dL

## 2020-07-22 LAB — HEMOGLOBIN A1C
Est. average glucose Bld gHb Est-mCnc: 103 mg/dL
Hgb A1c MFr Bld: 5.2 % (ref 4.8–5.6)

## 2020-07-22 LAB — HCV INTERPRETATION

## 2020-07-22 LAB — TSH: TSH: 3.2 u[IU]/mL (ref 0.450–4.500)

## 2020-07-22 NOTE — Telephone Encounter (Signed)
Received called from patient- she reports she is still bleeding from her exam yesterday and she is concern. I explained to patient sometimes the cervix is very sensitive when objects touch against it. She does report she is not having to wear a pad and not soaking up 1-2 pads per hour. I have advised her to just monitor her bleeding for the next couple of days.If bleeding increases go to  [facility] unit to be check out. Patient voice understanding at this time.

## 2020-07-23 LAB — URINE CULTURE, OB REFLEX

## 2020-07-23 LAB — CULTURE, OB URINE

## 2020-07-27 ENCOUNTER — Encounter: Payer: Self-pay | Admitting: Radiology

## 2020-07-28 ENCOUNTER — Encounter: Payer: Self-pay | Admitting: Radiology

## 2020-07-28 ENCOUNTER — Telehealth: Payer: Self-pay | Admitting: Radiology

## 2020-07-28 NOTE — Telephone Encounter (Signed)
Left message and sent mychart message to contact office for [facility] results. Results scanned.

## 2020-07-29 ENCOUNTER — Telehealth: Payer: Self-pay | Admitting: *Deleted

## 2020-07-29 ENCOUNTER — Encounter: Payer: Self-pay | Admitting: Obstetrics & Gynecology

## 2020-07-29 DIAGNOSIS — R87610 Atypical squamous cells of undetermined significance on cytologic smear of cervix (ASC-US): Secondary | ICD-10-CM | POA: Insufficient documentation

## 2020-07-29 HISTORY — DX: Atypical squamous cells of undetermined significance on cytologic smear of cervix (ASC-US): R87.610

## 2020-07-29 LAB — CYTOLOGY - PAP
Adequacy: ABSENT
Chlamydia: NEGATIVE
Comment: NEGATIVE
Comment: NEGATIVE
Comment: NEGATIVE
Comment: NEGATIVE
Comment: NORMAL
Diagnosis: UNDETERMINED — AB
HPV 16: NEGATIVE
HPV 18 / 45: NEGATIVE
High risk HPV: POSITIVE — AB
Neisseria Gonorrhea: NEGATIVE
Trichomonas: NEGATIVE

## 2020-07-29 NOTE — Telephone Encounter (Signed)
Pt informed of Pap results and needing a colpo and that we can do that at her next [facility] visit. Pt verbalizes and understands

## 2020-07-29 NOTE — Telephone Encounter (Signed)
-----   Message from Osborne Oman, MD sent at 07/29/2020 12:02 PM EDT ----- Please schedule patient for colposcopy for ASCUS +HPV pap done on 07/21/2020.  Can be added to her prenatal visit. Please call to inform patient of results and need for appointment.

## 2020-08-10 ENCOUNTER — Encounter: Payer: Self-pay | Admitting: Nurse Practitioner

## 2020-08-10 NOTE — Telephone Encounter (Signed)
Saved lab results from pt mychart. beth

## 2020-08-18 ENCOUNTER — Encounter: Admitting: Family Medicine

## 2020-08-24 ENCOUNTER — Ambulatory Visit (INDEPENDENT_AMBULATORY_CARE_PROVIDER_SITE_OTHER): Admitting: Obstetrics and Gynecology

## 2020-08-24 ENCOUNTER — Other Ambulatory Visit: Payer: Self-pay

## 2020-08-24 ENCOUNTER — Encounter: Payer: Self-pay | Admitting: Obstetrics and Gynecology

## 2020-08-24 VITALS — BP 130/84 | HR 91 | Wt 167.0 lb

## 2020-08-24 DIAGNOSIS — R87610 Atypical squamous cells of undetermined significance on cytologic smear of cervix (ASC-US): Secondary | ICD-10-CM

## 2020-08-24 DIAGNOSIS — O09512 Supervision of elderly primigravida, second trimester: Secondary | ICD-10-CM

## 2020-08-24 DIAGNOSIS — R87810 Cervical high risk human papillomavirus (HPV) DNA test positive: Secondary | ICD-10-CM

## 2020-08-24 DIAGNOSIS — Z3402 Encounter for supervision of normal first pregnancy, second trimester: Secondary | ICD-10-CM

## 2020-08-24 NOTE — Progress Notes (Signed)
PROCEDURE  Patient with ASCUS + HPV pap smear 06/2020 here for colposcopy Patient given informed consent, signed copy in the chart, time out was performed.  Placed in lithotomy position. Cervix viewed with speculum and colposcope after application of acetic acid.   Colposcopy adequate?  yes Acetowhite lesions? no Punctation? no Mosaicism?  no Abnormal vasculature?  no Biopsies? no ECC? no  COMMENTS:  Patient was given post procedure instructions.    Mora Bellman, MD

## 2020-08-24 NOTE — Progress Notes (Signed)
   PRENATAL VISIT NOTE  Subjective:  Jaclyn Gray is a 39 y.o. G1P0000 at [redacted]w[redacted]d being seen today for ongoing prenatal care.  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  Patient reports no complaints.  Contractions: Not present. Vag. Bleeding: None.   . Denies leaking of fluid.   The following portions of the patient's history were reviewed and updated as appropriate: allergies, current medications, past family history, past medical history, past social history, past surgical history and problem list.   Objective:   Vitals:   08/24/20 1624  BP: 130/84  Pulse: 91  Weight: 167 lb (75.8 kg)    Fetal Status: Fetal Heart Rate (bpm): 157         General:  Alert, oriented and cooperative. Patient is in no acute distress.  Skin: Skin is warm and dry. No rash noted.   Cardiovascular: Normal heart rate noted  Respiratory: Normal respiratory effort, no problems with respiration noted  Abdomen: Soft, gravid, appropriate for gestational age.  Pain/Pressure: Absent     Pelvic: Cervical exam deferred        Extremities: Normal range of motion.     Mental Status: Normal mood and affect. Normal behavior. Normal judgment and thought content.   Assessment and Plan:  Pregnancy: G1P0000 at [redacted]w[redacted]d 1. Encounter for supervision of normal first pregnancy in second trimester Patient is doing well without complaints Anatomy ultrasound ordered AFP today  2. 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 Primigravida of  advanced maternal age in second trimester Low risks NIPS  Preterm labor symptoms and general obstetric precautions including but not limited to vaginal bleeding, contractions, leaking of fluid and fetal movement were reviewed in detail with the patient. Please refer to After Visit Summary for other counseling recommendations.   Return in about 4 weeks (around 09/21/2020) for in person, ROB, Low risk.  Future Appointments  Date Time Provider [place]  09/30/2020  8:45 AM Luiz Ochoa, NP [facility] None    Mora Bellman, MD

## 2020-08-26 LAB — AFP, SERUM, OPEN SPINA BIFIDA
AFP MoM: 0.94
AFP Value: 33.5 ng/mL
Gest. Age on Collection Date: 16.1 weeks
Maternal Age At EDD: 38.8 yr
OSBR Risk 1 IN: 10000
Test Results:: NEGATIVE
Weight: 167 [lb_av]

## 2020-09-01 ENCOUNTER — Telehealth: Admitting: Physician Assistant

## 2020-09-01 DIAGNOSIS — Z20822 Contact with and (suspected) exposure to covid-19: Secondary | ICD-10-CM | POA: Diagnosis not present

## 2020-09-01 MED ORDER — BENZONATATE 100 MG PO CAPS
100.0000 mg | ORAL_CAPSULE | Freq: Two times a day (BID) | ORAL | 0 refills | Status: DC | PRN
Start: 2020-09-01 — End: 2020-09-01

## 2020-09-01 NOTE — Progress Notes (Signed)
E-Visit for Corona Virus Screening  Your current symptoms could be consistent with the coronavirus.  Many health care providers can now test patients at their office but not all are.  [facility] has multiple testing sites. For information on our [facility] testing locations and hours go to [email or web address]  We are enrolling you in our [place] for [place] . Daily you will receive a questionnaire within the [facility] website. Our COVID 19 response team will be monitoring your responses daily.  Testing Information: The COVID-19 Community Testing sites are testing BY APPOINTMENT ONLY.  You can schedule online at [email or web address]  If you do not have access to a smart phone or computer you may call [phone] for an appointment.   Additional testing sites in the Community:  . For [facility] Testing sites in [facility]  [email or web address]  . For Pop[facility] testing sites in [place]  [email or web address]  . For [facility] [email or web address]  . For [facility] testing in [place] and [employer] [email or web address]  . For [facility] testing in [facility]   [email or web address]  For  more information about community testing call [phone]   Please quarantine yourself while awaiting your test results. Please stay home for a minimum of 10 days from the first day of illness with improving symptoms and you have had 24 hours of no fever (without the use of Tylenol (Acetaminophen) Motrin (Ibuprofen) or any fever reducing medication).  Also - Do not get tested prior to returning to work because once you have had a positive test the test can stay  positive for more than a month in some cases.   You should wear a mask or cloth face covering over your nose and mouth if you must be around other people or animals, including pets (even at home). Try to stay at least 6 feet away from other people. This will protect the people around you.  Please continue good preventive care measures, including:  frequent hand-washing, avoid touching your face, cover coughs/sneezes, stay out of crowds and keep a 6 foot distance from others.  COVID-19 is a respiratory illness with symptoms that are similar to the flu. Symptoms are typically mild to moderate, but there have been cases of severe illness and death due to the virus.   The following symptoms may appear 2-14 days after exposure: . Fever . Cough . Shortness of breath or difficulty breathing . Chills . Repeated shaking with chills . Muscle pain . Headache . Sore throat . New loss of taste or smell . Fatigue . Congestion or runny nose . Nausea or vomiting . Diarrhea  Go to the nearest hospital ED for assessment if fever/cough/breathlessness are severe or illness seems like a threat to life.  It is vitally important that if you feel that you have an infection such as this virus or any other virus that you stay home and away from places where you may spread it to others.  You should avoid contact with people age 54 and older.  You may also take acetaminophen (Tylenol) as needed for fever.  Reduce your risk of any infection by using the same precautions used for avoiding the common cold or flu:  Marland Kitchen Wash your hands often with soap and warm water for at least 20 seconds.  If soap and water are not readily available, use an alcohol-based hand sanitizer with at least 60% alcohol.  . If coughing or sneezing, cover your mouth and nose by coughing or sneezing into the  elbow areas of your shirt or coat, into a tissue or into your sleeve (not your hands). . Avoid shaking hands with others and consider head nods  or verbal greetings only. . Avoid touching your eyes, nose, or mouth with unwashed hands.  . Avoid close contact with people who are sick. . Avoid places or events with large numbers of people in one location, like concerts or sporting events. . Carefully consider travel plans you have or are making. . If you are planning any travel outside or inside the [place], visit the CDC's Travelers' Health webpage for the latest health notices. . If you have some symptoms but not all symptoms, continue to monitor at home and seek medical attention if your symptoms worsen. . If you are having a medical emergency, call 911.  HOME CARE . Only take medications as instructed by your medical team. . Drink plenty of fluids and get plenty of rest. . A steam or ultrasonic humidifier can help if you have congestion.   GET HELP RIGHT AWAY IF YOU HAVE EMERGENCY WARNING SIGNS** FOR COVID-19. If you or someone is showing any of these signs seek emergency medical care immediately. Call 911 or proceed to your closest emergency facility if: . You develop worsening high fever. . Trouble breathing . Bluish lips or face . Persistent pain or pressure in the chest . New confusion . Inability to wake or stay awake . You cough up blood. . Your symptoms become more severe  **This list is not all possible symptoms. Contact your medical provider for any symptoms that are sever or concerning to you.  MAKE SURE YOU   Understand these instructions.  Will watch your condition.  Will get help right away if you are not doing well or get worse.  Your e-visit answers were reviewed by a board certified advanced clinical practitioner to complete your personal care plan.  Depending on the condition, your plan could have included both over the counter or prescription medications.  If there is a problem please reply once you have received a response from your provider.  Your safety is important to [place].  If you have drug allergies check  your prescription carefully.    You can use MyChart to ask questions about today's visit, request a non-urgent call back, or ask for a work or school excuse for 24 hours related to this e-Visit. If it has been greater than 24 hours you will need to follow up with your provider, or enter a new e-Visit to address those concerns. You will get an e-mail in the next two days asking about your experience.  I hope that your e-visit has been valuable and will speed your recovery. Thank you for using e-visits.   5 minutes spent on charting

## 2020-09-07 ENCOUNTER — Encounter: Payer: Self-pay | Admitting: Hospice and Palliative Medicine

## 2020-09-07 ENCOUNTER — Ambulatory Visit (INDEPENDENT_AMBULATORY_CARE_PROVIDER_SITE_OTHER): Admitting: Hospice and Palliative Medicine

## 2020-09-07 DIAGNOSIS — E782 Mixed hyperlipidemia: Secondary | ICD-10-CM

## 2020-09-07 DIAGNOSIS — J019 Acute sinusitis, unspecified: Secondary | ICD-10-CM | POA: Diagnosis not present

## 2020-09-07 DIAGNOSIS — Z3A18 18 weeks gestation of pregnancy: Secondary | ICD-10-CM

## 2020-09-07 NOTE — Progress Notes (Signed)
[facility] [place], [place] [address]  Internal MEDICINE  Telephone Visit  Patient Name: Jaclyn Gray  [id number]  [id number]  Date of Service: 09/08/2020  I connected with the patient at 1354 by telephone and verified the patients identity using two identifiers.   I discussed the limitations, risks, security and privacy concerns of performing an evaluation and management service by telephone and the availability of in person appointments. I also discussed with the patient that there may be a patient responsible charge related to the service.  The patient expressed understanding and agrees to proceed.    Chief Complaint  Patient presents with  . Acute Visit  . Cough    sore throat, feels exausted and body aches  . Telephone Screen    [phone]   . Telephone Assessment    video call    HPI Patient is being seen today for a sick visit Has had body aches for 1 week, throat has also been sore, headaches, congestion and started coughing this week, her symptoms are worsening and she has not noticed any improvement yet in how she is feeling She was tested for COVID and was found to be negative She has remained afebrile She is currently [redacted] weeks pregnant  Current Medication: Outpatient Encounter Medications as of 09/07/2020  Medication Sig  . Prenatal Vit-Fe Fumarate-FA (MULTIVITAMIN-PRENATAL) 27-0.8 MG TABS tablet Take 1 tablet by mouth daily at 12 noon.  Marland Kitchen azithromycin (ZITHROMAX) 250 MG tablet Take one tab po qd for 7 days   No facility-administered encounter medications on file as of 09/07/2020.    Surgical History: Past Surgical History:  Procedure Laterality Date  . WISDOM TOOTH EXTRACTION     x4 extracted     Medical History: Past Medical History:  Diagnosis Date  . Medical history non-contributory     Family History: Family History  Problem Relation Age of Onset  . Hypertension Mother   . Breast cancer Mother 39  . Prostate  cancer Father   . Hypertension Father   . Diabetes Father   . Throat cancer Maternal Grandmother   . Colon cancer Maternal Grandmother   . Lung cancer Maternal Grandfather     Social History   Socioeconomic History  . Marital status: Soil scientist    Spouse name: Not on file  . Number of children: Not on file  . Years of education: Not on file  . Highest education level: Not on file  Occupational History  . Not on file  Tobacco Use  . Smoking status: Never Smoker  . Smokeless tobacco: Never Used  Vaping Use  . Vaping Use: Never used  Substance and Sexual Activity  . Alcohol use: Yes    Comment: social   . Drug use: Never  . Sexual activity: Yes    Birth control/protection: None  Other Topics Concern  . Not on file  Social History Narrative  . Not on file   Social Determinants of Health   Financial Resource Strain:   . Difficulty of Paying Living Expenses: Not on file  Food Insecurity:   . Worried About Charity fundraiser in the Last Year: Not on file  . Ran Out of Food in the Last Year: Not on file  Transportation Needs:   . Lack of Transportation (Medical): Not on file  . Lack of Transportation (Non-Medical): Not on file  Physical Activity:   . Days of Exercise per Week: Not on file  . Minutes of Exercise  per Session: Not on file  Stress:   . Feeling of Stress : Not on file  Social Connections:   . Frequency of Communication with Friends and Family: Not on file  . Frequency of Social Gatherings with Friends and Family: Not on file  . Attends Religious Services: Not on file  . Active Member of Clubs or Organizations: Not on file  . Attends Archivist Meetings: Not on file  . Marital Status: Not on file  Intimate Partner Violence:   . Fear of Current or Ex-Partner: Not on file  . Emotionally Abused: Not on file  . Physically Abused: Not on file  . Sexually Abused: Not on file   Review of Systems  Constitutional: Positive for fatigue.  Negative for chills and diaphoresis.  HENT: Positive for congestion, sinus pressure, sinus pain and sore throat. Negative for postnasal drip.   Eyes: Negative for photophobia, discharge, redness, itching and visual disturbance.  Respiratory: Positive for cough. Negative for wheezing.   Cardiovascular: Negative for chest pain, palpitations and leg swelling.  Gastrointestinal: Negative for abdominal pain, constipation, diarrhea, nausea and vomiting.  Genitourinary: Negative for dysuria and flank pain.  Musculoskeletal: Positive for arthralgias and myalgias. Negative for back pain, gait problem and neck pain.  Skin: Negative for color change.  Allergic/Immunologic: Negative for environmental allergies and food allergies.  Neurological: Negative for dizziness and headaches.  Hematological: Does not bruise/bleed easily.  Psychiatric/Behavioral: Negative for agitation, behavioral problems (depression) and hallucinations.    Vital Signs: BP 98/66   Pulse 89   Resp 16   Ht 5\' 1"  (1.549 m)   Wt 163 lb (73.9 kg)   LMP 05/04/2020   BMI 30.80 kg/m    Observation/Objective: No acute distress noted, appear acutely ill on video.  Assessment/Plan: 1. Acute non-recurrent sinusitis, unspecified location Will treat symptoms with azithromycin, advised to contact office if symptoms persist or worsen for further evaluation. - azithromycin (ZITHROMAX) 250 MG tablet; Take one tab po qd for 7 days  Dispense: 7 tablet; Refill: 0  2. [redacted] weeks gestation of pregnancy No complications thus far in her pregnancy. Continues to have routine follow-up with OB.  3. Mixed hyperlipidemia Will obtain baseline lipid panel to assess for hyperlipidemia, will adjust plan of care accordingly. - Lipid Panel With LDL/HDL Ratio  General Counseling: Aronda verbalizes understanding of the findings of today's phone visit and agrees with plan of treatment. I have discussed any further diagnostic evaluation that may be  needed or ordered today. We also reviewed her medications today. she has been encouraged to call the office with any questions or concerns that should arise related to todays visit.    Orders Placed This Encounter  Procedures  . Lipid Panel With LDL/HDL Ratio    Meds ordered this encounter  Medications  . azithromycin (ZITHROMAX) 250 MG tablet    Sig: Take one tab po qd for 7 days    Dispense:  7 tablet    Refill:  0     Time spent: 25 Minutes Time spent includes review of chart, medications, test results and follow-up plan with the patient.  Tanna Furry Jameca Chumley AGNP-C Internal medicine

## 2020-09-08 ENCOUNTER — Encounter: Payer: Self-pay | Admitting: Hospice and Palliative Medicine

## 2020-09-08 MED ORDER — AZITHROMYCIN 250 MG PO TABS: ORAL_TABLET | ORAL | 0 refills | Status: DC

## 2020-09-09 ENCOUNTER — Ambulatory Visit (INDEPENDENT_AMBULATORY_CARE_PROVIDER_SITE_OTHER): Admitting: Obstetrics and Gynecology

## 2020-09-09 ENCOUNTER — Other Ambulatory Visit: Payer: Self-pay

## 2020-09-09 VITALS — BP 122/84 | HR 91 | Wt 166.0 lb

## 2020-09-09 DIAGNOSIS — Z3402 Encounter for supervision of normal first pregnancy, second trimester: Secondary | ICD-10-CM | POA: Diagnosis not present

## 2020-09-09 DIAGNOSIS — Z3A18 18 weeks gestation of pregnancy: Secondary | ICD-10-CM

## 2020-09-09 DIAGNOSIS — O09512 Supervision of elderly primigravida, second trimester: Secondary | ICD-10-CM

## 2020-09-09 LAB — POCT URINALYSIS DIPSTICK
Blood, UA: NEGATIVE
Leukocytes, UA: NEGATIVE

## 2020-09-09 NOTE — Progress Notes (Signed)
Has pelvic pain that radiated down to her vagina since Sunday

## 2020-09-09 NOTE — Progress Notes (Signed)
Prenatal Visit Note-Work In Visit Date: 09/09/2020 Clinic: [facility]  CC: b/l lower pelvic discomfort  Subjective:  Jaclyn Gray is a 39 y.o. G1P0000 at [redacted]w[redacted]d being seen today for ongoing prenatal care.  She is currently monitored for the following issues for this high-risk pregnancy and has Normal pap with positive high risk HPV on 07/18/2019; Varicose veins of both lower extremities with inflammation; At risk for fertility problems; Dysuria; Right anterior shoulder pain; Generalized anxiety disorder; Encounter for supervision of normal first pregnancy; AMA (advanced maternal age) primigravida 61+; and ASCUS with positive high risk HPV cervical on 07/21/2020 on their problem list.  Patient reports s/s, doesn't feel like menstrual cramps. ? Like gas pain, can be positional. No VB, LOF, discharge. No fetal quickening yet.   Contractions: Not present. Vag. Bleeding: None.   . Denies leaking of fluid.   The following portions of the patient's history were reviewed and updated as appropriate: allergies, current medications, past family history, past medical history, past social history, past surgical history and problem list. Problem list updated.  Objective:   Vitals:   09/09/20 1330  BP: 122/84  Pulse: 91  Weight: 166 lb (75.3 kg)    Fetal Status: Fetal Heart Rate (bpm): 150         General:  Alert, oriented and cooperative. Patient is in no acute distress.  Skin: Skin is warm and dry. No rash noted.   Cardiovascular: Normal heart rate noted  Respiratory: Normal respiratory effort, no problems with respiration noted  Abdomen: Soft, gravid, appropriate for gestational age. Pain/Pressure: Present     Pelvic:  Cervical exam deferred        Extremities: Normal range of motion.     Mental Status: Normal mood and affect. Normal behavior. Normal judgment and thought content.   Urinalysis:    negative  Assessment and Plan:  Pregnancy: G1P0000 at [redacted]w[redacted]d  1.  Encounter for supervision of normal first pregnancy in second trimester Likely RL discomfort. Has anatomy u/s already scheduled. afp neg last visit - POCT Urinalysis Dipstick  Preterm labor symptoms and general obstetric precautions including but not limited to vaginal bleeding, contractions, leaking of fluid and fetal movement were reviewed in detail with the patient. Please refer to After Visit Summary for other counseling recommendations.  No follow-ups on file.I told her she can keep her rob in two weeks or push it out since she was seen today   Aletha Halim, MD

## 2020-09-15 ENCOUNTER — Other Ambulatory Visit: Payer: Self-pay | Admitting: *Deleted

## 2020-09-15 ENCOUNTER — Other Ambulatory Visit: Payer: Self-pay

## 2020-09-15 ENCOUNTER — Ambulatory Visit: Attending: Obstetrics and Gynecology

## 2020-09-15 DIAGNOSIS — Z3402 Encounter for supervision of normal first pregnancy, second trimester: Secondary | ICD-10-CM

## 2020-09-15 DIAGNOSIS — Z362 Encounter for other antenatal screening follow-up: Secondary | ICD-10-CM

## 2020-09-21 ENCOUNTER — Encounter: Admitting: Family Medicine

## 2020-09-23 ENCOUNTER — Encounter: Payer: Self-pay | Admitting: Radiology

## 2020-09-27 ENCOUNTER — Ambulatory Visit (INDEPENDENT_AMBULATORY_CARE_PROVIDER_SITE_OTHER): Admitting: Obstetrics and Gynecology

## 2020-09-27 ENCOUNTER — Other Ambulatory Visit: Payer: Self-pay

## 2020-09-27 VITALS — BP 125/81 | HR 83 | Wt 168.0 lb

## 2020-09-27 DIAGNOSIS — Z3A20 20 weeks gestation of pregnancy: Secondary | ICD-10-CM

## 2020-09-27 DIAGNOSIS — O09512 Supervision of elderly primigravida, second trimester: Secondary | ICD-10-CM

## 2020-09-27 DIAGNOSIS — Z3402 Encounter for supervision of normal first pregnancy, second trimester: Secondary | ICD-10-CM

## 2020-09-28 NOTE — Progress Notes (Signed)
Prenatal Visit Note Date: 09/27/2020 Clinic: [facility]  Subjective:  Jaclyn Gray is a 39 y.o. G1P0000 at [redacted]w[redacted]d being seen today for ongoing prenatal care.  She is currently monitored for the following issues for this high-risk pregnancy and has Normal pap with positive high risk HPV on 07/18/2019; Varicose veins of both lower extremities with inflammation; At risk for fertility problems; Dysuria; Right anterior shoulder pain; Generalized anxiety disorder; Encounter for supervision of normal first pregnancy; AMA (advanced maternal age) primigravida 34+; and ASCUS with positive high risk HPV cervical on 07/21/2020 on their problem list.  Patient reports no complaints.   Contractions: Not present. Vag. Bleeding: None.  Movement: Present. Denies leaking of fluid.   The following portions of the patient's history were reviewed and updated as appropriate: allergies, current medications, past family history, past medical history, past social history, past surgical history and problem list. Problem list updated.  Objective:   Vitals:   09/27/20 1530  BP: 125/81  Pulse: 83  Weight: 168 lb (76.2 kg)    Fetal Status: Fetal Heart Rate (bpm): 147   Movement: Present     General:  Alert, oriented and cooperative. Patient is in no acute distress.  Skin: Skin is warm and dry. No rash noted.   Cardiovascular: Normal heart rate noted  Respiratory: Normal respiratory effort, no problems with respiration noted  Abdomen: Soft, gravid, appropriate for gestational age. Pain/Pressure: Present     Pelvic:  Cervical exam deferred        Extremities: Normal range of motion.  Edema: None  Mental Status: Normal mood and affect. Normal behavior. Normal judgment and thought content.   Urinalysis:      Assessment and Plan:  Pregnancy: G1P0000 at [redacted]w[redacted]d  1. Primigravida of advanced maternal age in second trimester No issues  2. Encounter for supervision of normal first pregnancy in  second trimester Follow up anatomyu/s on 10/20. Prior afp neg  3. [redacted] weeks gestation of pregnancy  Preterm labor symptoms and general obstetric precautions including but not limited to vaginal bleeding, contractions, leaking of fluid and fetal movement were reviewed in detail with the patient. Please refer to After Visit Summary for other counseling recommendations.  Return in about 1 month (around 10/28/2020) for low risk, in person.   Aletha Halim, MD

## 2020-09-30 ENCOUNTER — Ambulatory Visit (INDEPENDENT_AMBULATORY_CARE_PROVIDER_SITE_OTHER): Admitting: Hospice and Palliative Medicine

## 2020-09-30 ENCOUNTER — Encounter: Payer: Self-pay | Admitting: Hospice and Palliative Medicine

## 2020-09-30 DIAGNOSIS — J019 Acute sinusitis, unspecified: Secondary | ICD-10-CM

## 2020-09-30 DIAGNOSIS — Z3490 Encounter for supervision of normal pregnancy, unspecified, unspecified trimester: Secondary | ICD-10-CM

## 2020-09-30 DIAGNOSIS — E782 Mixed hyperlipidemia: Secondary | ICD-10-CM | POA: Diagnosis not present

## 2020-09-30 DIAGNOSIS — F4322 Adjustment disorder with anxiety: Secondary | ICD-10-CM | POA: Diagnosis not present

## 2020-09-30 NOTE — Progress Notes (Signed)
[facility] [place], [place] [address]  Internal MEDICINE  Office Visit Note  Patient Name: Jaclyn Gray  [id number]  [id number]  Date of Service: 10/04/2020  Chief Complaint  Patient presents with  . Follow-up  . Controlled substance policy      reviewed with pt    HPI Patient is seen today for routine follow-up  She was last seen for acute sinusitis, symptoms have completely resolved She is now [redacted] weeks pregnant, no complications with her pregnancy this far For her work she will be moving to [place] in the next few months, she is looking forward to this move as her current work place causes her much stress and anxiety She is worried that the amount of stress she is under is going to harm her baby or complicate her pregnancy When she moves she will be closer to her family which she is also looking forward to Reviewed labs with her--discussed I did not see a lipid panel on file, she says she has had this done, will send me a copy  Current Medication: Outpatient Encounter Medications as of 09/30/2020  Medication Sig  . Prenatal Vit-Fe Fumarate-FA (MULTIVITAMIN-PRENATAL) 27-0.8 MG TABS tablet Take 1 tablet by mouth daily at 12 noon.  . [DISCONTINUED] azithromycin (ZITHROMAX) 250 MG tablet Take one tab po qd for 7 days (Patient not taking: Reported on 09/30/2020)   No facility-administered encounter medications on file as of 09/30/2020.    Surgical History: Past Surgical History:  Procedure Laterality Date  . WISDOM TOOTH EXTRACTION     x4 extracted     Medical History: Past Medical History:  Diagnosis Date  . Medical history non-contributory     Family History: Family History  Problem Relation Age of Onset  . Hypertension Mother   . Breast cancer Mother 71  . Prostate cancer Father   . Hypertension Father   . Diabetes Father   . Throat cancer Maternal Grandmother   . Colon cancer Maternal Grandmother   . Lung cancer Maternal  Grandfather     Social History   Socioeconomic History  . Marital status: Soil scientist    Spouse name: Not on file  . Number of children: Not on file  . Years of education: Not on file  . Highest education level: Not on file  Occupational History  . Not on file  Tobacco Use  . Smoking status: Never Smoker  . Smokeless tobacco: Never Used  Vaping Use  . Vaping Use: Never used  Substance and Sexual Activity  . Alcohol use: Yes    Comment: social   . Drug use: Never  . Sexual activity: Yes    Birth control/protection: None  Other Topics Concern  . Not on file  Social History Narrative  . Not on file   Social Determinants of Health   Financial Resource Strain:   . Difficulty of Paying Living Expenses: Not on file  Food Insecurity:   . Worried About Charity fundraiser in the Last Year: Not on file  . Ran Out of Food in the Last Year: Not on file  Transportation Needs:   . Lack of Transportation (Medical): Not on file  . Lack of Transportation (Non-Medical): Not on file  Physical Activity:   . Days of Exercise per Week: Not on file  . Minutes of Exercise per Session: Not on file  Stress:   . Feeling of Stress : Not on file  Social Connections:   . Frequency  of Communication with Friends and Family: Not on file  . Frequency of Social Gatherings with Friends and Family: Not on file  . Attends Religious Services: Not on file  . Active Member of Clubs or Organizations: Not on file  . Attends Archivist Meetings: Not on file  . Marital Status: Not on file  Intimate Partner Violence:   . Fear of Current or Ex-Partner: Not on file  . Emotionally Abused: Not on file  . Physically Abused: Not on file  . Sexually Abused: Not on file   Review of Systems  Constitutional: Negative for chills, diaphoresis and fatigue.  HENT: Negative for ear pain, postnasal drip and sinus pressure.   Eyes: Negative for photophobia, discharge, redness, itching and visual  disturbance.  Respiratory: Negative for cough, shortness of breath and wheezing.   Cardiovascular: Negative for chest pain, palpitations and leg swelling.  Gastrointestinal: Negative for abdominal pain, constipation, diarrhea, nausea and vomiting.  Genitourinary: Negative for dysuria and flank pain.  Musculoskeletal: Negative for arthralgias, back pain, gait problem and neck pain.  Skin: Negative for color change.  Allergic/Immunologic: Negative for environmental allergies and food allergies.  Neurological: Negative for dizziness and headaches.  Hematological: Does not bruise/bleed easily.  Psychiatric/Behavioral: Negative for agitation, behavioral problems (depression) and hallucinations.    Vital Signs: BP 120/80   Pulse 88   Temp 98.1 F (36.7 C)   Resp 16   Ht 5\' 1"  (1.549 m)   Wt 179 lb (81.2 kg) Comment: with shoe  LMP 05/04/2020   SpO2 99%   BMI 33.82 kg/m    Physical Exam Constitutional:      Appearance: Normal appearance.  Cardiovascular:     Rate and Rhythm: Normal rate and regular rhythm.     Pulses: Normal pulses.     Heart sounds: Normal heart sounds.  Pulmonary:     Effort: Pulmonary effort is normal.     Breath sounds: Normal breath sounds.  Abdominal:     General: Abdomen is flat.     Palpations: Abdomen is soft.  Skin:    General: Skin is warm.  Neurological:     General: No focal deficit present.     Mental Status: She is alert and oriented to person, place, and time. Mental status is at baseline.  Psychiatric:        Mood and Affect: Mood normal.        Behavior: Behavior normal.        Thought Content: Thought content normal.    Assessment/Plan: 1. 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 Discussed meditation as well as therapy to help alleviate her anxiety level, she is not interested at this time starting on any medication due to her  pregnancy.  2. Mixed hyperlipidemia Reviewed lipid panel, slight elevation in total cholesterol and well as LDL levels. Discussed dietary changes that could be made to help in reducing her levels. Will need to be further monitored after pregnancy.  3. Pregnancy, unspecified gestational age About [redacted] weeks pregnant with her first child, no complications thus far in pregnancy, followed by OB.  4. Acute non-recurrent sinusitis, unspecified location Resolved.  General Counseling: Bibiana verbalizes understanding of the findings of todays visit and agrees with plan of treatment. I have discussed any further diagnostic evaluation that may be needed or ordered today. We also reviewed her medications today. she has  been encouraged to call the office with any questions or concerns that should arise related to todays visit.  Time spent: 30 Minutes Time spent includes review of chart, medications, test results and follow-up plan with the patient.  This patient was seen by Theodoro Grist AGNP-C in Collaboration with Dr Lavera Guise as a part of collaborative care agreement     Tanna Furry. Unika Nazareno AGNP-C Internal medicine

## 2020-10-01 ENCOUNTER — Encounter (HOSPITAL_COMMUNITY): Payer: Self-pay | Admitting: Obstetrics and Gynecology

## 2020-10-01 ENCOUNTER — Inpatient Hospital Stay (HOSPITAL_COMMUNITY)
Admission: AD | Admit: 2020-10-01 | Discharge: 2020-10-01 | Disposition: A | Discharge status: Home / Self Care | Attending: Obstetrics and Gynecology | Admitting: Obstetrics and Gynecology

## 2020-10-01 ENCOUNTER — Other Ambulatory Visit: Payer: Self-pay

## 2020-10-01 DIAGNOSIS — R102 Pelvic and perineal pain: Secondary | ICD-10-CM | POA: Diagnosis not present

## 2020-10-01 DIAGNOSIS — O26899 Other specified pregnancy related conditions, unspecified trimester: Secondary | ICD-10-CM

## 2020-10-01 DIAGNOSIS — Z3A21 21 weeks gestation of pregnancy: Secondary | ICD-10-CM | POA: Diagnosis not present

## 2020-10-01 DIAGNOSIS — O26892 Other specified pregnancy related conditions, second trimester: Secondary | ICD-10-CM | POA: Insufficient documentation

## 2020-10-01 LAB — URINALYSIS, ROUTINE W REFLEX MICROSCOPIC
Bilirubin Urine: NEGATIVE
Glucose, UA: NEGATIVE mg/dL
Hgb urine dipstick: NEGATIVE
Ketones, ur: NEGATIVE mg/dL
Leukocytes,Ua: NEGATIVE
Nitrite: NEGATIVE
Protein, ur: NEGATIVE mg/dL
Specific Gravity, Urine: 1.001 — ABNORMAL LOW (ref 1.005–1.030)
pH: 7 (ref 5.0–8.0)

## 2020-10-01 MED ORDER — COMFORT FIT MATERNITY SUPP SM MISC: 1.0000 [IU] | Freq: Every day | 0 refills | Status: DC | PRN

## 2020-10-01 MED ORDER — SIMETHICONE 80 MG PO CHEW
80.0000 mg | CHEWABLE_TABLET | Freq: Once | ORAL | Status: AC
  Administered 2020-10-01: 80 mg via ORAL
  Filled 2020-10-01: qty 1

## 2020-10-01 MED ORDER — CYCLOBENZAPRINE HCL 10 MG PO TABS: 10.0000 mg | ORAL_TABLET | Freq: Three times a day (TID) | ORAL | 1 refills | Status: DC | PRN

## 2020-10-01 NOTE — MAU Provider Note (Signed)
Chief Complaint:  Abdominal Pain   First Provider Initiated Contact with Patient 10/01/20 1613     HPI: Jaclyn Gray is a 39 y.o. G1P0000 at [redacted]w[redacted]d who presents to maternity admissions reporting left sided pain that originated near her pubic bone and has migrated to her left hip and radiates into her rectum. This has been going on for two days. She initially thought it was constipation as that has been a recurrent problem for her, but she had a bowel movement and the pain has not subsided. The pain is worst when she tries to lift her left leg to step into underwear, socks, or shoes and when she tries to sit up or turn over. Denies vaginal bleeding, leaking of fluid, decreased fetal movement, fever, falls, or recent illness.   Pregnancy Course: Uncomplicated  Past Medical History:  Diagnosis Date  . Medical history non-contributory    OB History  Gravida Para Term Preterm AB Living  1 0 0 0 0 0  SAB TAB Ectopic Multiple Live Births  0 0 0 0 0    # Outcome Date GA Lbr Len/2nd Weight Sex Delivery Anes PTL Lv  1 Current            Past Surgical History:  Procedure Laterality Date  . WISDOM TOOTH EXTRACTION     x4 extracted    Family History  Problem Relation Age of Onset  . Hypertension Mother   . Breast cancer Mother 82  . Prostate cancer Father   . Hypertension Father   . Diabetes Father   . Throat cancer Maternal Grandmother   . Colon cancer Maternal Grandmother   . Lung cancer Maternal Grandfather    Social History   Tobacco Use  . Smoking status: Never Smoker  . Smokeless tobacco: Never Used  Vaping Use  . Vaping Use: Never used  Substance Use Topics  . Alcohol use: Yes    Comment: social   . Drug use: Never   No Known Allergies Medications Prior to Admission  Medication Sig Dispense Refill Last Dose  . Prenatal Vit-Fe Fumarate-FA (MULTIVITAMIN-PRENATAL) 27-0.8 MG TABS tablet Take 1 tablet by mouth daily at 12 noon.   10/01/2020 at Unknown time    I have  reviewed patient's Past Medical Hx, Surgical Hx, Family Hx, Social Hx, medications and allergies.   ROS:  Review of Systems  Respiratory: Negative for cough and shortness of breath.   Gastrointestinal: Positive for abdominal distention and constipation. Negative for abdominal pain, nausea and vomiting.  Genitourinary: Positive for pelvic pain. Negative for dysuria, flank pain, urgency, vaginal bleeding, vaginal discharge and vaginal pain.  Neurological: Negative for dizziness, weakness, light-headedness and headaches.  All other systems reviewed and are negative.   Physical Exam   Patient Vitals for the past 24 hrs:  BP Temp Temp src Pulse Resp SpO2  10/01/20 1541 125/78 98.5 F (36.9 C) Oral 98 18 100 %   Constitutional: Well-developed, well-nourished female in no acute distress.  Cardiovascular: normal rate & rhythm, no murmur Respiratory: normal effort, lung sounds clear throughout GI: Abd soft, non-tender, gravid appropriate for gestational age. Pos BS x 4 MS: Extremities nontender, no edema, normal ROM Neurologic: Alert and oriented x 4.  Pelvic exam deferred  FHR: 160   Labs: Results for orders placed or performed during the hospital encounter of 10/01/20 (from the past 24 hour(s))  Urinalysis, Routine w reflex microscopic     Status: Abnormal   Collection Time: 10/01/20  3:34 PM  Result Value Ref Range   Color, Urine STRAW (A) YELLOW   APPearance CLEAR CLEAR   Specific Gravity, Urine 1.001 (L) 1.005 - 1.030   pH 7.0 5.0 - 8.0   Glucose, UA NEGATIVE NEGATIVE mg/dL   Hgb urine dipstick NEGATIVE NEGATIVE   Bilirubin Urine NEGATIVE NEGATIVE   Ketones, ur NEGATIVE NEGATIVE mg/dL   Protein, ur NEGATIVE NEGATIVE mg/dL   Nitrite NEGATIVE NEGATIVE   Leukocytes,Ua NEGATIVE NEGATIVE    Imaging:  No results found.  MAU Course: Orders Placed This Encounter  Procedures  . Urinalysis, Routine w reflex microscopic  . Discharge patient   Meds ordered this encounter   Medications  . simethicone (MYLICON) chewable tablet 80 mg  . cyclobenzaprine (FLEXERIL) 10 MG tablet    Sig: Take 1 tablet (10 mg total) by mouth every 8 (eight) hours as needed for muscle spasms.    Dispense:  30 tablet    Refill:  1    Order Specific Question:   Supervising Provider    Answer:   Donnamae Jude [[id number]]  . DISCONTD: Elastic Bandages & Supports (COMFORT FIT MATERNITY SUPP SM) MISC    Sig: 1 Units by Does not apply route daily as needed.    Dispense:  1 each    Refill:  0    Order Specific Question:   Supervising Provider    Answer:   Donnamae Jude [[id number]]  . Elastic Bandages & Supports (COMFORT FIT MATERNITY SUPP SM) MISC    Sig: 1 Units by Does not apply route daily as needed.    Dispense:  1 each    Refill:  0    Order Specific Question:   Supervising Provider    Answer:   Merrily Pew    MDM: Tried simethicone for gas pain, little relief felt Showed pt round ligament massage/stretches Offered flexeril, pt drove herself, will d/c with flexeril for bedtime  Assessment: 1. Pain of round ligament during pregnancy     Plan: Discharge home in stable condition with preterm labor precautions.    Follow-up [place] [facility] Follow up.   Specialty: Obstetrics and Gynecology Contact information: [place] [id number] [phone]              Allergies as of 10/01/2020   No Known Allergies     Medication List    TAKE these medications   Comfort Fit Maternity Supp Sm Misc 1 Units by Does not apply route daily as needed.   cyclobenzaprine 10 MG tablet Commonly known as: FLEXERIL Take 1 tablet (10 mg total) by mouth every 8 (eight) hours as needed for muscle spasms.   multivitamin-prenatal 27-0.8 MG Tabs tablet Take 1 tablet by mouth daily at 12 noon.      Gaylan Gerold, CNM, MSN, [facility] 10/01/20 7:38 PM

## 2020-10-01 NOTE — MAU Note (Signed)
Pt. Reports to MAU with lower left abdominal pain that radiates to groin.  Pain began yesterday.  Denies vag. Bleeding and discharge.

## 2020-10-01 NOTE — Discharge Instructions (Signed)
Round Ligament Massage & Stretches  Massage: Starting at the middle of your pubic bone, trace little circles in a wide U from your pubic bone to your hip bones on both sides.  Then starting just above your pubic bone, press in and down, alternating sides to create a gentle rocking of your uterus back and forth.  Move your hands up the sides of your belly and back down. Do this 3-5 times upon waking and before bed.  Stretches: Get on hands and knees and alternate arching your back deeply while inhaling, and then rounding your back while exhaling. Modified runners lunge:  - Sit on a chair with half of your bottom on the chair and half off.  - Sit up tall, plant your front foot, and stretch your other foot out behind you.  - Breathe deeply for 5 breaths and then do the other side.    PREGNANCY SUPPORT BELT: You are not alone, Seventy-five percent of women have some sort of abdominal or back pain at some point in their pregnancy. Your baby is growing at a fast pace, which means that your whole body is rapidly trying to adjust to the changes. As your uterus grows, your back may start feeling a bit under stress and this can result in back or abdominal pain that can go from mild, and therefore bearable, to severe pains that will not allow you to sit or lay down comfortably, When it comes to dealing with pregnancy-related pains and cramps, some pregnant women usually prefer natural remedies, which the market is filled with nowadays. For example, wearing a pregnancy support belt can help ease and lessen your discomfort and pain. WHAT ARE THE BENEFITS OF WEARING A PREGNANCY SUPPORT BELT? A pregnancy support belt provides support to the lower portion of the belly taking some of the weight of the growing uterus and distributing to the other parts of your body. It is designed make you comfortable and gives you extra support. Over the years, the pregnancy apparel market has been studying the needs and wants of  pregnant women and they have come up with the most comfortable pregnancy support belts that woman could ever ask for. In fact, you will no longer have to wear a stretched-out or bulky pregnancy belt that is visible underneath your clothes and makes you feel even more uncomfortable. Nowadays, a pregnancy support belt is made of comfortable and stretchy materials that will not irritate your skin but will actually make you feel at ease and you will not even notice you are wearing it. They are easy to put on and adjust during the day and can be worn at night for additional support.  BENEFITS:  Relives Back pain  Relieves Abdominal Muscle and Leg Pain  Stabilizes the Pelvic Ring  Offers a Cushioned Abdominal Lift Pad  Relieves pressure on the Sciatic Nerve Within Minutes WHERE TO GET YOUR PREGNANCY BELT: [employer] [phone] @[address]  [place],  [id number]    Round Ligament Pain  The round ligament is a cord of muscle and tissue that helps support the uterus. It can become a source of pain during pregnancy if it becomes stretched or twisted as the baby grows. The pain usually begins in the second trimester (13-28 weeks) of pregnancy, and it can come and go until the baby is delivered. It is not a serious problem, and it does not cause harm to the baby. Round ligament pain is usually a short, sharp, and pinching  pain, but it can also be a dull, lingering, and aching pain. The pain is felt in the lower side of the abdomen or in the groin. It usually starts deep in the groin and moves up to the outside of the hip area. The pain may occur when you:  Suddenly change position, such as quickly going from a sitting to standing position.  Roll over in bed.  Cough or sneeze.  Do physical activity. Follow these instructions at home:   Watch your condition for any changes.  When the pain starts, relax. Then try any of these methods to help with the pain: ? Sitting  down. ? Flexing your knees up to your abdomen. ? Lying on your side with one pillow under your abdomen and another pillow between your legs. ? Sitting in a warm bath for 15-20 minutes or until the pain goes away.  Take over-the-counter and prescription medicines only as told by your health care provider.  Move slowly when you sit down or stand up.  Avoid long walks if they cause pain.  Stop or reduce your physical activities if they cause pain.  Keep all follow-up visits as told by your health care provider. This is important. Contact a health care provider if:  Your pain does not go away with treatment.  You feel pain in your back that you did not have before.  Your medicine is not helping. Get help right away if:  You have a fever or chills.  You develop uterine contractions.  You have vaginal bleeding.  You have nausea or vomiting.  You have diarrhea.  You have pain when you urinate. Summary  Round ligament pain is felt in the lower abdomen or groin. It is usually a short, sharp, and pinching pain. It can also be a dull, lingering, and aching pain.  This pain usually begins in the second trimester (13-28 weeks). It occurs because the uterus is stretching with the growing baby, and it is not harmful to the baby.  You may notice the pain when you suddenly change position, when you cough or sneeze, or during physical activity.  Relaxing, flexing your knees to your abdomen, lying on one side, or taking a warm bath may help to get rid of the pain.  Get help from your health care provider if the pain does not go away or if you have vaginal bleeding, nausea, vomiting, diarrhea, or painful urination. This information is not intended to replace advice given to you by your health care provider. Make sure you discuss any questions you have with your health care provider. Document Revised: 05/29/2018 Document Reviewed: 05/29/2018 Elsevier Patient Education  [place].

## 2020-10-04 ENCOUNTER — Encounter: Payer: Self-pay | Admitting: Hospice and Palliative Medicine

## 2020-10-13 ENCOUNTER — Encounter: Payer: Self-pay | Admitting: *Deleted

## 2020-10-13 ENCOUNTER — Ambulatory Visit: Attending: Obstetrics and Gynecology

## 2020-10-13 ENCOUNTER — Ambulatory Visit: Admitting: *Deleted

## 2020-10-13 ENCOUNTER — Other Ambulatory Visit: Payer: Self-pay

## 2020-10-13 VITALS — BP 130/85 | HR 95

## 2020-10-13 DIAGNOSIS — O09522 Supervision of elderly multigravida, second trimester: Secondary | ICD-10-CM | POA: Diagnosis not present

## 2020-10-13 DIAGNOSIS — Z3A23 23 weeks gestation of pregnancy: Secondary | ICD-10-CM

## 2020-10-13 DIAGNOSIS — D259 Leiomyoma of uterus, unspecified: Secondary | ICD-10-CM

## 2020-10-13 DIAGNOSIS — Z362 Encounter for other antenatal screening follow-up: Secondary | ICD-10-CM | POA: Diagnosis present

## 2020-10-13 DIAGNOSIS — O3412 Maternal care for benign tumor of corpus uteri, second trimester: Secondary | ICD-10-CM | POA: Diagnosis not present

## 2020-10-13 DIAGNOSIS — O09812 Supervision of pregnancy resulting from assisted reproductive technology, second trimester: Secondary | ICD-10-CM

## 2020-10-13 DIAGNOSIS — O09529 Supervision of elderly multigravida, unspecified trimester: Secondary | ICD-10-CM

## 2020-10-14 ENCOUNTER — Other Ambulatory Visit: Payer: Self-pay | Admitting: *Deleted

## 2020-10-14 DIAGNOSIS — D219 Benign neoplasm of connective and other soft tissue, unspecified: Secondary | ICD-10-CM

## 2020-10-19 ENCOUNTER — Encounter: Payer: Self-pay | Admitting: Hospice and Palliative Medicine

## 2020-10-19 ENCOUNTER — Ambulatory Visit (INDEPENDENT_AMBULATORY_CARE_PROVIDER_SITE_OTHER): Admitting: Hospice and Palliative Medicine

## 2020-10-19 ENCOUNTER — Other Ambulatory Visit: Payer: Self-pay

## 2020-10-19 DIAGNOSIS — Z0001 Encounter for general adult medical examination with abnormal findings: Secondary | ICD-10-CM

## 2020-10-19 DIAGNOSIS — Z3490 Encounter for supervision of normal pregnancy, unspecified, unspecified trimester: Secondary | ICD-10-CM

## 2020-10-19 DIAGNOSIS — F4322 Adjustment disorder with anxiety: Secondary | ICD-10-CM

## 2020-10-19 NOTE — Progress Notes (Signed)
[facility] [place], [place] [address]  Internal MEDICINE  Office Visit Note  Patient Name: Jaclyn Gray  [id number]  [id number]  Date of Service: 10/20/2020  Chief Complaint  Patient presents with  . Annual Exam     HPI Pt is here for routine health maintenance examination [facility] to [facility]--treated with muscle relaxer and pain has since resolved No other complications related to her pregnancy this far Having frequent US's due to having uterine fibroids  She continues to have issues with her job and feels as though the environment is toxic, still going through the process of being transferred to [place] all recent labs with her--normal/stable at this time Has both breast and vaginal exams done with OBGYN  Current Medication: Outpatient Encounter Medications as of 10/19/2020  Medication Sig  . Elastic Bandages & Supports (COMFORT FIT MATERNITY SUPP SM) MISC 1 Units by Does not apply route daily as needed.  . Prenatal Vit-Fe Fumarate-FA (MULTIVITAMIN-PRENATAL) 27-0.8 MG TABS tablet Take 1 tablet by mouth daily at 12 noon.  . cyclobenzaprine (FLEXERIL) 10 MG tablet Take 1 tablet (10 mg total) by mouth every 8 (eight) hours as needed for muscle spasms. (Patient not taking: Reported on 10/19/2020)   No facility-administered encounter medications on file as of 10/19/2020.    Surgical History: Past Surgical History:  Procedure Laterality Date  . WISDOM TOOTH EXTRACTION     x4 extracted     Medical History: Past Medical History:  Diagnosis Date  . Medical history non-contributory     Family History: Family History  Problem Relation Age of Onset  . Hypertension Mother   . Breast cancer Mother 17  . Prostate cancer Father   . Hypertension Father   . Diabetes Father   . Throat cancer Maternal Grandmother   . Colon cancer Maternal Grandmother   . Lung cancer Maternal Grandfather      Review of Systems  Constitutional: Negative for chills, diaphoresis and fatigue.  HENT: Negative for ear pain, postnasal drip and sinus pressure.   Eyes: Negative for photophobia, discharge, redness, itching and visual disturbance.  Respiratory: Negative for cough, shortness of breath and wheezing.   Cardiovascular: Negative for chest pain, palpitations and leg swelling.  Gastrointestinal: Negative for abdominal pain, constipation, diarrhea, nausea and vomiting.  Genitourinary: Negative for dysuria and flank pain.  Musculoskeletal: Negative for arthralgias, back pain, gait problem and neck pain.  Skin: Negative for color change.  Allergic/Immunologic: Negative for environmental allergies and food allergies.  Neurological: Negative for dizziness and headaches.  Hematological: Does not bruise/bleed easily.  Psychiatric/Behavioral: Negative for agitation, behavioral problems (depression) and hallucinations.     Vital Signs: BP 110/70   Pulse 79   Temp 97.8 F (36.6 C)   Resp 16   Ht 5\' 1"  (1.549 m)   Wt 170 lb (77.1 kg)   LMP 05/04/2020   SpO2 99%   BMI 32.12 kg/m    Physical Exam Vitals reviewed.  Constitutional:      Appearance: Normal appearance.  Cardiovascular:     Rate and Rhythm: Normal rate and regular rhythm.     Pulses: Normal pulses.     Heart sounds: Normal heart sounds.  Pulmonary:     Effort: Pulmonary effort is normal.     Breath sounds: Normal breath sounds.  Musculoskeletal:        General: Normal range of motion.  Skin:    General: Skin is warm.  Neurological:     General: No focal deficit present.     Mental Status: She is alert and oriented to person, place, and time. Mental status is at baseline.  Psychiatric:        Mood and Affect: Mood normal.        Behavior: Behavior normal.        Thought Content: Thought content normal.      LABS: Recent Results (from the past 2160 hour(s))  AFP, Serum, Open Spina Bifida     Status: None    Collection Time: 08/25/20  8:51 AM  Result Value Ref Range   Results Report    Test Results: *Screen Negative*    Gest. Age on Collection Date 16.1 weeks   Gestat. Age Based On Ultrasound     Comment:                               16.0 on 08/24/2020 Recalculations are not recommended when gestational dating by LMP and ultrasound are within 10 days.    [age]D 39.8 [age]   Race Black    Weight 167 lbs   Insulin Dep Diabetes No    Multiple Gestation No    AFP Value 33.5 ng/mL   AFP MoM 0.94    OSBR Risk 1 IN 10,000    Interpretation Comment     Comment: Interpretation: Screen Negative This result is screen negative for fetal OSB. The AFP MoM calculated is based on the gestational age provided. MS-AFP can identify up to 80% of open fetal neural tube defects. Closed neural tube defects and some open defects may not be detected by this test. This test does not screen for fetal Down Syndrome or Trisomy 18. If screening for Down Syndrome or Trisomy 18 is desired, Print production planner to discuss available options.  The [employer] of Obstetricians and Gynecologists recommends amniocentesis be offered to women age 61 and older.    Comment: Comment     Comment: Juanda Crumble, Ph.D., [facility] Director References: Available Upon Request. Multiples Of Median Cutoffs         For AFP Elevations Singleton   2.5           Black    2.8 IDD         2.0           Twins    4.5         Abbreviation Definitions IDD - Insulin Dep Diabetes OSBR - Open Spina Bifida Risk For further inquiries contact [place] at [phone].   POCT Urinalysis Dipstick     Status: Normal   Collection Time: 09/09/20  1:42 PM  Result Value Ref Range   Color, UA     Clarity, UA     Glucose, UA     Bilirubin, UA     Ketones, UA     Spec Grav, UA     Blood, UA negative    pH, UA     Protein, UA     Urobilinogen, UA     Nitrite, UA     Leukocytes, UA Negative  Negative   Appearance     Odor    Urinalysis, Routine w reflex microscopic     Status: Abnormal   Collection Time: 10/01/20  3:34 PM  Result Value Ref Range   Color, Urine STRAW (A) YELLOW   APPearance CLEAR CLEAR  Specific Gravity, Urine 1.001 (L) 1.005 - 1.030   pH 7.0 5.0 - 8.0   Glucose, UA NEGATIVE NEGATIVE mg/dL   Hgb urine dipstick NEGATIVE NEGATIVE   Bilirubin Urine NEGATIVE NEGATIVE   Ketones, ur NEGATIVE NEGATIVE mg/dL   Protein, ur NEGATIVE NEGATIVE mg/dL   Nitrite NEGATIVE NEGATIVE   Leukocytes,Ua NEGATIVE NEGATIVE    Comment: Performed at [facility] [address], [place], [place] [address]    Assessment/Plan: 1. Encounter for routine adult health examination with abnormal findings Well appearing 39 year old [place] female, currently about [redacted] weeks pregnant with her first child Reviewed updated labs--normal Up to date on PHM per recommended guideliens    2. Pregnancy, unspecified gestational age Followed and managed by OBGYN, no complications at this point  3. Adjustment disorder with anxiety Continues to struggle with anxiety--working on meditation to help with this Looking forward to her upcoming transfer to a different location  General Counseling: Zian verbalizes understanding of the findings of todays visit and agrees with plan of treatment. I have discussed any further diagnostic evaluation that may be needed or ordered today. We also reviewed her medications today. she has been encouraged to call the office with any questions or concerns that should arise related to todays visit.    Counseling:   Total time spent: 30 Minutes  Time spent includes review of chart, medications, test results, and follow up plan with the patient.   This patient was seen by Casey Burkitt AGNP-C Collaboration with Dr Lavera Guise as a part of collaborative care agreement   Tanna Furry. [facility] Internal Medicine

## 2020-10-20 ENCOUNTER — Encounter: Payer: Self-pay | Admitting: Hospice and Palliative Medicine

## 2020-10-26 ENCOUNTER — Ambulatory Visit (INDEPENDENT_AMBULATORY_CARE_PROVIDER_SITE_OTHER): Admitting: Obstetrics and Gynecology

## 2020-10-26 ENCOUNTER — Encounter: Payer: Self-pay | Admitting: Radiology

## 2020-10-26 ENCOUNTER — Other Ambulatory Visit: Payer: Self-pay

## 2020-10-26 VITALS — BP 112/77 | HR 112 | Wt 170.0 lb

## 2020-10-26 DIAGNOSIS — Z3A25 25 weeks gestation of pregnancy: Secondary | ICD-10-CM

## 2020-10-26 DIAGNOSIS — D259 Leiomyoma of uterus, unspecified: Secondary | ICD-10-CM

## 2020-10-26 DIAGNOSIS — O0990 Supervision of high risk pregnancy, unspecified, unspecified trimester: Secondary | ICD-10-CM

## 2020-10-26 DIAGNOSIS — Z566 Other physical and mental strain related to work: Secondary | ICD-10-CM | POA: Insufficient documentation

## 2020-10-26 DIAGNOSIS — O09512 Supervision of elderly primigravida, second trimester: Secondary | ICD-10-CM

## 2020-10-26 HISTORY — DX: Leiomyoma of uterus, unspecified: D25.9

## 2020-10-26 NOTE — Progress Notes (Signed)
Prenatal Visit Note Date: 10/26/2020 Clinic: [facility]  Subjective:  Jaclyn Gray is a 39 y.o. G1P0000 at [redacted]w[redacted]d being seen today for ongoing prenatal care.  She is currently monitored for the following issues for this high-risk pregnancy and has Normal pap with positive high risk HPV on 07/18/2019; Varicose veins of both lower extremities with inflammation; At risk for fertility problems; Dysuria; Right anterior shoulder pain; Generalized anxiety disorder; Supervision of high risk pregnancy, antepartum; AMA (advanced maternal age) primigravida 35+; ASCUS with positive high risk HPV cervical on 07/21/2020; Uterine leiomyoma; and Stress at work on their problem list.  Patient reports stress at work.   Contractions: Not present. Vag. Bleeding: None.  Movement: Present. Denies leaking of fluid.   The following portions of the patient's history were reviewed and updated as appropriate: allergies, current medications, past family history, past medical history, past social history, past surgical history and problem list. Problem list updated.  Objective:   Vitals:   10/26/20 1021  BP: 112/77  Pulse: (!) 112  Weight: 170 lb (77.1 kg)    Fetal Status: Fetal Heart Rate (bpm): 145   Movement: Present     General:  Alert, oriented and cooperative. Patient is in no acute distress.  Skin: Skin is warm and dry. No rash noted.   Cardiovascular: Normal heart rate noted  Respiratory: Normal respiratory effort, no problems with respiration noted  Abdomen: Soft, gravid, appropriate for gestational age. Pain/Pressure: Absent     Pelvic:  Cervical exam deferred        Extremities: Normal range of motion.  Edema: None  Mental Status: Normal mood and affect. Normal behavior. Normal judgment and thought content.   Urinalysis:      Assessment and Plan:  Pregnancy: G1P0000 at [redacted]w[redacted]d  1. Primigravida of advanced maternal age in second trimester No issues  2. Supervision of high  risk pregnancy, antepartum 28wk labs, gtt nv  3. Uterine leiomyoma, unspecified location Getting surveillance growth u/s  4. 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   Preterm labor symptoms and general obstetric precautions including but not limited to vaginal bleeding, contractions, leaking of fluid and fetal movement were reviewed in detail with the patient. Please refer to After Visit Summary for other counseling recommendations.  Return in about 2 weeks (around 11/09/2020) for low risk, high risk, in person, 2hr GTT.   Aletha Halim, MD

## 2020-11-09 ENCOUNTER — Other Ambulatory Visit: Payer: Self-pay | Admitting: *Deleted

## 2020-11-09 ENCOUNTER — Other Ambulatory Visit: Payer: Self-pay

## 2020-11-09 ENCOUNTER — Ambulatory Visit: Admitting: *Deleted

## 2020-11-09 ENCOUNTER — Encounter: Payer: Self-pay | Admitting: *Deleted

## 2020-11-09 ENCOUNTER — Ambulatory Visit: Attending: Obstetrics and Gynecology

## 2020-11-09 VITALS — BP 120/73 | HR 96

## 2020-11-09 DIAGNOSIS — D219 Benign neoplasm of connective and other soft tissue, unspecified: Secondary | ICD-10-CM | POA: Diagnosis not present

## 2020-11-09 DIAGNOSIS — O43192 Other malformation of placenta, second trimester: Secondary | ICD-10-CM

## 2020-11-09 DIAGNOSIS — O3412 Maternal care for benign tumor of corpus uteri, second trimester: Secondary | ICD-10-CM | POA: Diagnosis not present

## 2020-11-09 DIAGNOSIS — D259 Leiomyoma of uterus, unspecified: Secondary | ICD-10-CM

## 2020-11-09 DIAGNOSIS — O09812 Supervision of pregnancy resulting from assisted reproductive technology, second trimester: Secondary | ICD-10-CM

## 2020-11-09 DIAGNOSIS — E669 Obesity, unspecified: Secondary | ICD-10-CM

## 2020-11-09 DIAGNOSIS — O09522 Supervision of elderly multigravida, second trimester: Secondary | ICD-10-CM | POA: Diagnosis not present

## 2020-11-09 DIAGNOSIS — Z362 Encounter for other antenatal screening follow-up: Secondary | ICD-10-CM

## 2020-11-09 DIAGNOSIS — O09512 Supervision of elderly primigravida, second trimester: Secondary | ICD-10-CM | POA: Diagnosis present

## 2020-11-09 DIAGNOSIS — Z3A27 27 weeks gestation of pregnancy: Secondary | ICD-10-CM

## 2020-11-09 DIAGNOSIS — O99212 Obesity complicating pregnancy, second trimester: Secondary | ICD-10-CM

## 2020-11-11 ENCOUNTER — Telehealth: Payer: Self-pay | Admitting: *Deleted

## 2020-11-11 NOTE — Telephone Encounter (Signed)
Pt called concerned about feeling light headed and having some cramping. Pt denies any vaginal bleeding, reports normal fetal movement. Advised pt to really increase her water intake, that this could be due to being dehydrated. Informed to call if anything gets worse.

## 2020-11-23 ENCOUNTER — Encounter: Payer: Self-pay | Admitting: Obstetrics & Gynecology

## 2020-11-23 ENCOUNTER — Encounter: Payer: Self-pay | Admitting: Radiology

## 2020-11-23 ENCOUNTER — Ambulatory Visit (INDEPENDENT_AMBULATORY_CARE_PROVIDER_SITE_OTHER): Admitting: Obstetrics & Gynecology

## 2020-11-23 ENCOUNTER — Encounter: Payer: Self-pay | Admitting: *Deleted

## 2020-11-23 ENCOUNTER — Other Ambulatory Visit: Payer: Self-pay

## 2020-11-23 VITALS — BP 122/80 | HR 108 | Wt 173.0 lb

## 2020-11-23 DIAGNOSIS — Z3A29 29 weeks gestation of pregnancy: Secondary | ICD-10-CM

## 2020-11-23 DIAGNOSIS — Z23 Encounter for immunization: Secondary | ICD-10-CM

## 2020-11-23 DIAGNOSIS — O09513 Supervision of elderly primigravida, third trimester: Secondary | ICD-10-CM

## 2020-11-23 DIAGNOSIS — O0990 Supervision of high risk pregnancy, unspecified, unspecified trimester: Secondary | ICD-10-CM

## 2020-11-23 LAB — CBC
Hematocrit: 34.7 % (ref 34.0–46.6)
Hemoglobin: 11.8 g/dL (ref 11.1–15.9)
MCH: 31.3 pg (ref 26.6–33.0)
MCHC: 34 g/dL (ref 31.5–35.7)
MCV: 92 fL (ref 79–97)
Platelets: 298 10*3/uL (ref 150–450)
RBC: 3.77 x10E6/uL (ref 3.77–5.28)
RDW: 12.2 % (ref 11.7–15.4)
WBC: 12.1 10*3/uL — ABNORMAL HIGH (ref 3.4–10.8)

## 2020-11-23 NOTE — Patient Instructions (Addendum)
Return to office for any scheduled appointments. Call the office or go to the MAU at [facility] if:  You begin to have strong, frequent contractions  Your water breaks.  Sometimes it is a big gush of fluid, sometimes it is just a trickle that keeps getting your panties wet or running down your legs  You have vaginal bleeding.  It is normal to have a small amount of spotting if your cervix was checked.   You do not feel your baby moving like normal.  If you do not, get something to eat and drink and lay down and focus on feeling your baby move.   If your baby is still not moving like normal, you should call the office or go to MAU.  Any other obstetric concerns.   Third Trimester of Pregnancy The third trimester is from week 28 through week 40 (months 7 through 9). The third trimester is a time when the unborn baby (fetus) is growing rapidly. At the end of the ninth month, the fetus is about 20 inches in length and weighs 6-10 pounds. Body changes during your third trimester Your body will continue to go through many changes during pregnancy. The changes vary from woman to woman. During the third trimester:  Your weight will continue to increase. You can expect to gain 25-35 pounds (11-16 kg) by the end of the pregnancy.  You may begin to get stretch marks on your hips, abdomen, and breasts.  You may urinate more often because the fetus is moving lower into your pelvis and pressing on your bladder.  You may develop or continue to have heartburn. This is caused by increased hormones that slow down muscles in the digestive tract.  You may develop or continue to have constipation because increased hormones slow digestion and cause the muscles that push waste through your intestines to relax.  You may develop hemorrhoids. These are swollen veins (varicose veins) in the rectum that can itch or be painful.  You may develop swollen, bulging veins (varicose veins)  in your legs.  You may have increased body aches in the pelvis, back, or thighs. This is due to weight gain and increased hormones that are relaxing your joints.  You may have changes in your hair. These can include thickening of your hair, rapid growth, and changes in texture. Some women also have hair loss during or after pregnancy, or hair that feels dry or thin. Your hair will most likely return to normal after your baby is born.  Your breasts will continue to grow and they will continue to become tender. A yellow fluid (colostrum) may leak from your breasts. This is the first milk you are producing for your baby.  Your belly button may stick out.  You may notice more swelling in your hands, face, or ankles.  You may have increased tingling or numbness in your hands, arms, and legs. The skin on your belly may also feel numb.  You may feel short of breath because of your expanding uterus.  You may have more problems sleeping. This can be caused by the size of your belly, increased need to urinate, and an increase in your body's metabolism.  You may notice the fetus "dropping," or moving lower in your abdomen (lightening).  You may have increased vaginal discharge.  You may notice your joints feel loose and you may have pain around your pelvic bone. What to expect at prenatal visits You will have   prenatal exams every 2 weeks until week 36. Then you will have weekly prenatal exams. During a routine prenatal visit:  You will be weighed to make sure you and the baby are growing normally.  Your blood pressure will be taken.  Your abdomen will be measured to track your baby's growth.  The fetal heartbeat will be listened to.  Any test results from the previous visit will be discussed.  You may have a cervical check near your due date to see if your cervix has softened or thinned (effaced).  You will be tested for Group B streptococcus. This happens between 35 and 37 weeks. Your  health care provider may ask you:  What your birth plan is.  How you are feeling.  If you are feeling the baby move.  If you have had any abnormal symptoms, such as leaking fluid, bleeding, severe headaches, or abdominal cramping.  If you are using any tobacco products, including cigarettes, chewing tobacco, and electronic cigarettes.  If you have any questions. Other tests or screenings that may be performed during your third trimester include:  Blood tests that check for low iron levels (anemia).  Fetal testing to check the health, activity level, and growth of the fetus. Testing is done if you have certain medical conditions or if there are problems during the pregnancy.  Nonstress test (NST). This test checks the health of your baby to make sure there are no signs of problems, such as the baby not getting enough oxygen. During this test, a belt is placed around your belly. The baby is made to move, and its heart rate is monitored during movement. What is false labor? False labor is a condition in which you feel small, irregular tightenings of the muscles in the womb (contractions) that usually go away with rest, changing position, or drinking water. These are called Braxton Hicks contractions. Contractions may last for hours, days, or even weeks before true labor sets in. If contractions come at regular intervals, become more frequent, increase in intensity, or become painful, you should see your health care provider. What are the signs of labor?  Abdominal cramps.  Regular contractions that start at 10 minutes apart and become stronger and more frequent with time.  Contractions that start on the top of the uterus and spread down to the lower abdomen and back.  Increased pelvic pressure and dull back pain.  A watery or bloody mucus discharge that comes from the vagina.  Leaking of amniotic fluid. This is also known as your "water breaking." It could be a slow trickle or a gush.  Let your health care provider know if it has a color or strange odor. If you have any of these signs, call your health care provider right away, even if it is before your due date. Follow these instructions at home: Medicines  Follow your health care provider's instructions regarding medicine use. Specific medicines may be either safe or unsafe to take during pregnancy.  Take a prenatal vitamin that contains at least 600 micrograms (mcg) of folic acid.  If you develop constipation, try taking a stool softener if your health care provider approves. Eating and drinking   Eat a balanced diet that includes fresh fruits and vegetables, whole grains, good sources of protein such as meat, eggs, or tofu, and low-fat dairy. Your health care provider will help you determine the amount of weight gain that is right for you.  Avoid raw meat and uncooked cheese. These carry germs that   can cause birth defects in the baby.  If you have low calcium intake from food, talk to your health care provider about whether you should take a daily calcium supplement.  Eat four or five small meals rather than three large meals a day.  Limit foods that are high in fat and processed sugars, such as fried and sweet foods.  To prevent constipation: ? Drink enough fluid to keep your urine clear or pale yellow. ? Eat foods that are high in fiber, such as fresh fruits and vegetables, whole grains, and beans. Activity  Exercise only as directed by your health care provider. Most women can continue their usual exercise routine during pregnancy. Try to exercise for 30 minutes at least 5 days a week. Stop exercising if you experience uterine contractions.  Avoid heavy lifting.  Do not exercise in extreme heat or humidity, or at high altitudes.  Wear low-heel, comfortable shoes.  Practice good posture.  You may continue to have sex unless your health care provider tells you otherwise. Relieving pain and  discomfort  Take frequent breaks and rest with your legs elevated if you have leg cramps or low back pain.  Take warm sitz baths to soothe any pain or discomfort caused by hemorrhoids. Use hemorrhoid cream if your health care provider approves.  Wear a good support bra to prevent discomfort from breast tenderness.  If you develop varicose veins: ? Wear support pantyhose or compression stockings as told by your healthcare provider. ? Elevate your feet for 15 minutes, 3-4 times a day. Prenatal care  Write down your questions. Take them to your prenatal visits.  Keep all your prenatal visits as told by your health care provider. This is important. Safety  Wear your seat belt at all times when driving.  Make a list of emergency phone numbers, including numbers for family, friends, the hospital, and police and fire departments. General instructions  Avoid cat litter boxes and soil used by cats. These carry germs that can cause birth defects in the baby. If you have a cat, ask someone to clean the litter box for you.  Do not travel far distances unless it is absolutely necessary and only with the approval of your health care provider.  Do not use hot tubs, steam rooms, or saunas.  Do not drink alcohol.  Do not use any products that contain nicotine or tobacco, such as cigarettes and e-cigarettes. If you need help quitting, ask your health care provider.  Do not use any medicinal herbs or unprescribed drugs. These chemicals affect the formation and growth of the baby.  Do not douche or use tampons or scented sanitary pads.  Do not cross your legs for long periods of time.  To prepare for the arrival of your baby: ? Take prenatal classes to understand, practice, and ask questions about labor and delivery. ? Make a trial run to the hospital. ? Visit the hospital and tour the maternity area. ? Arrange for maternity or paternity leave through employers. ? Arrange for family and  friends to take care of pets while you are in the hospital. ? Purchase a rear-facing car seat and make sure you know how to install it in your car. ? Pack your hospital bag. ? Prepare the baby's nursery. Make sure to remove all pillows and stuffed animals from the baby's crib to prevent suffocation.  Visit your dentist if you have not gone during your pregnancy. Use a soft toothbrush to brush your teeth and be   gentle when you floss. Contact a health care provider if:  You are unsure if you are in labor or if your water has broken.  You become dizzy.  You have mild pelvic cramps, pelvic pressure, or nagging pain in your abdominal area.  You have lower back pain.  You have persistent nausea, vomiting, or diarrhea.  You have an unusual or bad smelling vaginal discharge.  You have pain when you urinate. Get help right away if:  Your water breaks before 37 weeks.  You have regular contractions less than 5 minutes apart before 37 weeks.  You have a fever.  You are leaking fluid from your vagina.  You have spotting or bleeding from your vagina.  You have severe abdominal pain or cramping.  You have rapid weight loss or weight gain.  You have shortness of breath with chest pain.  You notice sudden or extreme swelling of your face, hands, ankles, feet, or legs.  Your baby makes fewer than 10 movements in 2 hours.  You have severe headaches that do not go away when you take medicine.  You have vision changes. Summary  The third trimester is from week 28 through week 40, months 7 through 9. The third trimester is a time when the unborn baby (fetus) is growing rapidly.  During the third trimester, your discomfort may increase as you and your baby continue to gain weight. You may have abdominal, leg, and back pain, sleeping problems, and an increased need to urinate.  During the third trimester your breasts will keep growing and they will continue to become tender. A yellow  fluid (colostrum) may leak from your breasts. This is the first milk you are producing for your baby.  False labor is a condition in which you feel small, irregular tightenings of the muscles in the womb (contractions) that eventually go away. These are called Braxton Hicks contractions. Contractions may last for hours, days, or even weeks before true labor sets in.  Signs of labor can include: abdominal cramps; regular contractions that start at 10 minutes apart and become stronger and more frequent with time; watery or bloody mucus discharge that comes from the vagina; increased pelvic pressure and dull back pain; and leaking of amniotic fluid. This information is not intended to replace advice given to you by your health care provider. Make sure you discuss any questions you have with your health care provider. Document Revised: 04/03/2019 Document Reviewed: 01/16/2017 Elsevier Patient Education  2020 [place] [phone]) . [facility] Davy Pique, MD; Gwendlyn Deutscher, MD; Walker Kehr, MD; Andria Frames, MD; McDiarmid, MD; Dutch Quint, MD; Nori Riis, MD; Mingo Amber, [address] [place], [place] [address] o [phone] o Mon-Fri 8:30-12:30, 1:30-5:00 o Providers come to see babies at [facility] o Accepting Medicaid . [place] [facility] [place] providers who accept newborns: Dorthy Cooler, MD; Orland Mustard, MD; Stephanie Acre, MD o [place], [place], [place] [address] o [phone] o Mon-Fri 8:00-5:30 o Babies seen by providers at [facility] o Does NOT accept Medicaid o Please call early in hospitalization for appointment (limited availability)  . Mustard [place], MD o [address] [place], [place] [address] o [phone] o Mon, Tue, Thur, Fri 8:30-5:00, Wed 10:00-7:00 (closed 1-2pm) o Babies seen by [facility] providers o Accepting  Medicaid . [place], MD o [place], [place], [place]  [address] o [phone] o Mon-Fri 8:30-5:00, Sat 8:30-12:00 o Provider comes to see babies at [place] Medicaid o Must have been referred from current patients or contacted office prior to delivery . [facility] ([place] [facility] [place]) Franne Forts, MD; Tamera Punt, MD; Doneen Poisson, MD; Fatima Sanger, MD; Wynetta Emery, MD; Jess Barters, MD; Tami Ribas, MD; Herbert Moors, MD; Derrell Lolling, MD; Dorothyann Peng, MD; Lucious Groves, NP; Baldo Ash, NP o [address], [place], [place] [address] o [phone] o Mon, Tue, Thur, Fri 8:30-5:30, Wed 9:30-5:30, Sat 8:30-12:30 o Babies seen by [facility] providers o Accepting Medicaid o Only accepting infants of first-time parents or siblings of current patients [facility] discharge coordinator will make follow-up appointment . Baltazar Najjar o [place] [address], [place], [place]  [address] o [phone]   Fax - [phone] . [facility] o [address], [place], [place]  [address] o Phone - [phone]   Fax [phone] . [place], [place], [place], [place]  [address] o [phone]  [place] [phone]) . [facility] Reginal Lutes, MD; Jacklynn Ganong, MD; Torrie Mayers, MD; MD; Rosana Hoes, MD; Servando Salina, MD; Rose Fillers, MD; Rex Kras, MD; Corinna Capra, MD; Volney American, MD; Trilby Drummer, MD; Janann Colonel, MD; Jimmye Norman, [place], [place], [place] [address] o [phone] o Mon-Fri 8:30-5:00 (extended evenings Mon-Thur as needed), Sat-Sun 10:00-1:00 o Providers come to see babies at [place] Medicaid for families of first-time babies and families with all children in the household age 40 and under. Must register with office prior to making appointment (M-F only). . [place], NP; Tomi Bamberger, MD; Redmond School, MD; [place], [place] [place], [place]  [address] o [phone] o Mon-Fri 8:00-5:00 o Babies seen by providers at [facility] o Does NOT accept Medicaid/Commercial Insurance Only . [facility] [place] [facility])  Marnee Guarneri, MD; Drema Dallas, MD; Montine Circle, MD; Vilma Prader, MD; Vanita Panda, MD; Alfonso Ramus, MD; Ruthann Cancer, MD; Roxanne Mins, MD; Rosalva Ferron, MD; Polly Cobia, MD o [address] [place], [place] [address] o [phone] o Mon-Fri 8:30-5:30, Sat (Oct.-Mar.) 9:00-1:00 o Babies seen by providers at [place] [phone]) . [facility] Peers, MD; Suzan Slick, MD o [address], [place], [place] [address] o [phone] o Mon-Fri 8:30-5:00, Sat 8:30-12:00 o Providers come to see babies at [facility] o Does NOT accept Medicaid . [place], [place]; [place], MD; [place], [place]; Nancy Fetter, MD; Moreen Fowler, [place], [place], [place] [address] o [phone] o Mon-Fri 8:00-5:00 o Babies seen by providers at [facility] o Does NOT accept Medicaid o Only accepting babies of parents who are patients o Please call early in hospitalization for appointment (limited availability) . [facility] Blanca Friend, MD; Sharlene Motts, MD; Rod Can, MD; Warner Mccreedy, NP; Sabra Heck, MD; Ermalinda Memos, MD; Sharlett Iles, NP; Aurther Loft, MD; Jerrye Beavers, MD; Marcello Moores, MD; Berline Lopes, MD; Charolette Forward, MD o [place] [place], [place],  [address] o [phone] o Mon-Fri 8:00-5:00, Sat 9:00-12:00 o Providers come to see babies at [facility] o Does NOT accept [facility] [phone]) . [place] [facility] [place] providers accepting new patients: Dayna Ramus, NP; [place], [place], [place],  [address] o [phone] o Mon-Fri 8:00-5:00 o Babies seen by providers at [facility] o Does NOT accept Medicaid o Only accepting babies of  parents who are  patients o Please call early in hospitalization for appointment (limited availability) . [facility] Oswaldo Conroy, MD; Sheran Lawless, MD o [address] [place], [place] [address] o [phone] (press 1 to schedule appointment) o Mon-Fri 8:00-5:00 o Providers come to see babies at [facility] o Does NOT accept Medicaid . [facility] Jodi Mourning, MD o [address], [place], [place] [address] o [phone] o Mon-Fri 8:30-5:00 (lunch 12:30-1:00), extended hours by appointment only Wed 5:00-6:30 o Babies seen by [facility] providers o Accepting Medicaid . [place] [facility] Evalyn Casco, MD; [place], MD; Ethlyn Gallery, MD o [place], [place], [place] [address] o [phone] o Mon-Fri 8:00-5:00 o [facility] by [facility] providers o Does NOT accept Medicaid . Therapist, music at [place], MD; Yong Channel, MD; [place], [address] [place], [place] [address] o [phone] o Mon-Fri 8:00-5:00 o Babies seen by [facility] providers o Does NOT accept Medicaid . [place], [place]; [place], [place]; [place], NP; Albertina Parr, MD; Frederic Jericho, MD; Ronney Lion, MD; Carlos Levering, NP; Jerelene Redden, NP; Tomasita Crumble, NP; Ronelle Nigh, NP; Corinna Lines, MD; Martin, MD o [address] [place], [place] [address] o [phone] o Mon-Fri 8:30-5:00, Sat 10:00-1:00 o Providers come to see babies at [facility] o Does NOT accept Medicaid o Free prenatal information session Tuesdays at 4:45pm . [facility] Porfirio Oar, MD; [place], [place]; [place], [place]; [place], [place]., [place] [address] o [phone] o Mon-Fri 7:30-5:30 o Babies seen by [facility] providers . [facility] o [address] [place], [place], [place]  [address] o [phone]   Fax - [phone]  [place] [phone] & [phone]) . [place], [place] o [address] [place], [place]  [address] o [phone] o Mon-Thur 8:00-6:00 o Providers come to see babies at [place] Medicaid . [name], NP; Melford Aase, MD; [place], [place]; [place], [address] [place], [place] [address] o [phone] o Mon-Thur 7:30-7:30, Fri 7:30-4:30 o Babies seen by [facility] providers o Accepting Medicaid . [facility] Nyra Jabs, MD; Cristino Martes, NP; Gertie Baron, MD o [place] [address], [place], [place] [address] o [phone] o Mon-Fri 8:30-5:00, Sat 8:30-12:00 o Providers come to see babies at [place] Medicaid o Must have "Meet & Greet" appointment at office prior to delivery . [place] ([place]) Jodene Nam, MD; Juleen China, MD; Clydene L[place], [address], [place], [place] [address] o [phone] o Mon-Wed 8:00-6:00, Thur-Fri 8:00-5:00, Sat 9:00-12:00 o Providers come to see babies at [facility] o Does NOT accept Medicaid o Only accepting siblings of current patients . [facility] [place], [place], [place], [place]  [address] o [phone]   Fax [phone] . [employer] at [place] [address] [place], [place]  [address] o [phone]   Fax - [place] [phone] & [phone]) . Therapist, music at [place], DO; [place], [address] [place], [place] [address] o [phone] o Mon-Fri 7:00-5:00 o Babies seen by [facility] providers o Does NOT accept Medicaid . [place], MD; [place], [place]; [place], [place], [place], [place] [address] o [phone] o Mon-Fri 8:00-5:00 o Babies seen by [facility]  providers o Accepting Medicaid . [place], MD; [place], [place]; [place], NP; [place],  [place], [place], [place] [address] o [phone] o Mon-Fri 8:00-5:00 o Babies seen by providers at [place] [phone]) . [facility] [place], [place] o [address] [place], [place] [address] o [phone] o Mon-Fri 8:00-5:00 o Babies seen by [facility] providers o Does NOT accept Medicaid o Limited availability, please call early in hospitalization to schedule follow-up . [facility] Leilani Merl, PA; Maisie Fus, MD; [place], MD; [place], [place]; Jeannine Kitten, MD; [place], [place] [facility] [address], [place], [place] [address] o [phone] o Mon-Fri 8:30-5:00, Sat 9:00-12:00 o Babies seen by providers at [facility] o Accepting Medicaid o Please register online then schedule online or call office o [email or web address] . [place] ([place] [facility] [place]) Kristian Covey, NP; Dwyane Dee, MD; Leonidas Romberg, PA o [address] [place], [place], [place] [address] o [phone] o Mon-Fri 8:00-5:00 o Babies seen by providers at [facility] o Accepting Medicaid . [place] ([facility] [employer]) Dairl Ponder, MD; Rayvon Char, NP; Melina Modena, MD o [address] [place], [place], [place] [address] o [phone] o Mon-Fri 8:00-5:30, Sat&Sun by appointment (phones open at 8:30) o Babies seen by [facility] providers o Accepting Medicaid o Must be a first-time baby or sibling of current patient . [place], [address], [place], [place]  [address] o [phone]   Fax - [phone]  [place] [phone] & [phone]) . [place], [place]; [place], [place]; Benjamine Mola, MD; [place], [place]; Harrell Lark, MD o [address], [place], [place] [address] o [phone] o Mon-Thur 8:00-7:00, Fri 8:00-5:00, Sat 8:00-12:00, Sun 9:00-12:00 o Babies seen by [facility] providers o Accepting Medicaid . [facility] at [facility], MD; Ruthann Cancer, MD; [facility], MD o [address], [place] [address] o [phone] o Mon-Thur 8:00-5:00 o Babies seen by providers at [facility] o Accepting Medicaid . [facility] [place], MD; [place], MD; Amedeo Plenty, MD; Bobby Rumpf, MD; [place], MD; Lavonia Drafts, MD; Ruthann Cancer, MD; Selinda Eon, MD; Audie Box, MD; Jim Like, MD; Christie Nottingham, MD; Hubbard Hartshorn, MD; Modena Nunnery, MD o [address] [place], [place] [address] o [phone] o Mon-Fri 8:00-5:30, Sat (Oct.-Mar.) 9:00-1:00 o Babies seen by providers at [facility] o Accepting Medicaid o Must fill out new patient packet, available online at [email or web address] . [place] ([facility]) Barnabas Lister, NP; Kenton Kingfisher, NP; Claiborne Billings, NP; Rolla Plate, MD; [place], [place]; Carola Rhine, MD; Tyron [place], MD; Delia Chimes, NP o [address], [place], [place] [address] o [phone] o Mon-Thur 8:00-5:30, Fri 8:00-5:00 o Babies seen by providers at [place] [phone]) . [place], [place]; [place], [place]; Dennard Schaumann, MD; [place], [place] o [address] [place], [place] [address] o [phone] o Mon-Fri 8:00-5:00 o Babies seen by providers at [employer] [phone]) . [place] [facility] [place], [place]; Olen Pel, MD; [place], [place], [place], [place] [address] o [phone] o Mon-Fri 8:00-5:00 o Babies  seen by providers at [facility] o Does NOT accept Medicaid o Limited appointment availability, please call early in hospitalization  . Therapist, music at [place], [place] [facility]; [place], [address], [place], [place] [address] o [phone] o Mon-Fri 8:00-5:00 o Babies seen by [facility] providers o Does NOT accept Medicaid . [facility] Su Grand, MD; Guy Sandifer, MD; [place], [place]; [place], [address], [place], [place] [address] o [phone] o Mon-Fri 8:00-5:00 o After hours clinic [facility] [place], [place] [address]) [phone] Mon-Fri 5:00-8:00, Sat 12:00-6:00, Sun 10:00-4:00 o Babies seen by [facility] providers o Accepting Medicaid . [place] at [facility] o [address] [place], [place]  [address] o [phone]   Fax - [phone]  [place] [phone]) . Therapist, music at [facility], [place] o [address] [place], [place], [place] [address] o [phone] o Mon-Fri 8:00-5:00 o Babies seen by [facility] providers o Does NOT accept Medicaid . [place] ([place] [facility] [place]) Bing Neighbors, MD o [address] [place], [place], [place] [address] o [phone] o Mon-Thur 8:00-7:00, Fri 8:00-5:00, Sat 8:00-12:00 o Babies seen by providers at [facility] o Accepting Medicaid - but does not have vaccinations in office (must be received elsewhere) o Limited availability, please call early in hospitalization  [place] ([phone]) . [place], [place] [address] [place] [address] o [phone]  Fax [phone]

## 2020-11-23 NOTE — Progress Notes (Signed)
   PRENATAL VISIT NOTE  Subjective:  Jaclyn Gray is a 39 y.o. G1P0000 at [redacted]w[redacted]d being seen today for ongoing prenatal care.  She is currently monitored for the following issues for this high-risk pregnancy and has Normal pap with positive high risk HPV on 07/18/2019; Varicose veins of both lower extremities with inflammation; At risk for fertility problems; Dysuria; Right anterior shoulder pain; Generalized anxiety disorder; Supervision of high risk pregnancy, antepartum; AMA (advanced maternal age) primigravida 35+; ASCUS with positive high risk HPV cervical on 07/21/2020; Uterine leiomyoma; and Stress at work on their problem list.  Patient reports no complaints.  Contractions: Not present. Vag. Bleeding: None.  Movement: Present. Denies leaking of fluid.   The following portions of the patient's history were reviewed and updated as appropriate: allergies, current medications, past family history, past medical history, past social history, past surgical history and problem list.   Objective:   Vitals:   11/23/20 0928  BP: 122/80  Pulse: (!) 108  Weight: 173 lb (78.5 kg)    Fetal Status: Fetal Heart Rate (bpm): 174   Movement: Present     General:  Alert, oriented and cooperative. Patient is in no acute distress.  Skin: Skin is warm and dry. No rash noted.   Cardiovascular: Normal heart rate noted  Respiratory: Normal respiratory effort, no problems with respiration noted  Abdomen: Soft, gravid, appropriate for gestational age.  Pain/Pressure: Absent     Pelvic: Cervical exam deferred        Extremities: Normal range of motion.  Edema: None  Mental Status: Normal mood and affect. Normal behavior. Normal judgment and thought content.   Assessment and Plan:  Pregnancy: G1P0000 at [redacted]w[redacted]d 1. Primigravida of advanced maternal age in third trimester 2. [redacted] weeks gestation of pregnancy 3. Supervision of high risk pregnancy, antepartum Third trimester labs done today, will follow up  results and manage accordingly. - Glucose Tolerance, 2 Hours w/1 Hour - CBC - RPR - HIV Antibody (routine testing w rflx) Wants to stop working in person, wants to work remotely, but employer is not being helpful. Desires a note saying that we recommend that she work remotely given pregnancy and need for reduction od exposure to [place]. Preterm labor symptoms and general obstetric precautions including but not limited to vaginal bleeding, contractions, leaking of fluid and fetal movement were reviewed in detail with the patient. Please refer to After Visit Summary for other counseling recommendations.   Return in about 2 weeks (around 12/07/2020) for OFFICE OB VISIT (MD or APP); please schedule after MFM scan.  Future Appointments  Date Time Provider [place]  12/07/2020 10:00 AM Aletha Halim, MD [facility]  12/07/2020 12:30 PM [facility] NURSE [facility]  12/07/2020 12:45 PM [facility] US5 [facility]  12/21/2020 10:00 AM Kerney Hopfensperger, Sallyanne Havers, MD [facility]  01/19/2021  9:15 AM Luiz Ochoa, NP [facility] None  10/12/2021 11:00 AM Luiz Ochoa, NP [facility] None    Verita Schneiders, MD

## 2020-11-24 LAB — GLUCOSE TOLERANCE, 2 HOURS W/ 1HR
Glucose, 1 hour: 133 mg/dL (ref 65–179)
Glucose, 2 hour: 102 mg/dL (ref 65–152)
Glucose, Fasting: 82 mg/dL (ref 65–91)

## 2020-11-24 LAB — RPR: RPR Ser Ql: NONREACTIVE

## 2020-11-24 LAB — HIV ANTIBODY (ROUTINE TESTING W REFLEX): HIV Screen 4th Generation wRfx: NONREACTIVE

## 2020-12-07 ENCOUNTER — Other Ambulatory Visit: Payer: Self-pay | Admitting: *Deleted

## 2020-12-07 ENCOUNTER — Ambulatory Visit

## 2020-12-07 ENCOUNTER — Other Ambulatory Visit: Payer: Self-pay

## 2020-12-07 ENCOUNTER — Encounter: Payer: Self-pay | Admitting: *Deleted

## 2020-12-07 ENCOUNTER — Ambulatory Visit: Attending: Obstetrics and Gynecology

## 2020-12-07 ENCOUNTER — Ambulatory Visit: Admitting: *Deleted

## 2020-12-07 ENCOUNTER — Ambulatory Visit (INDEPENDENT_AMBULATORY_CARE_PROVIDER_SITE_OTHER): Admitting: Obstetrics and Gynecology

## 2020-12-07 ENCOUNTER — Encounter: Admitting: Obstetrics and Gynecology

## 2020-12-07 VITALS — BP 128/76 | HR 93

## 2020-12-07 VITALS — BP 108/73 | HR 120 | Wt 176.0 lb

## 2020-12-07 DIAGNOSIS — O09513 Supervision of elderly primigravida, third trimester: Secondary | ICD-10-CM

## 2020-12-07 DIAGNOSIS — O3413 Maternal care for benign tumor of corpus uteri, third trimester: Secondary | ICD-10-CM | POA: Diagnosis not present

## 2020-12-07 DIAGNOSIS — E669 Obesity, unspecified: Secondary | ICD-10-CM

## 2020-12-07 DIAGNOSIS — O09523 Supervision of elderly multigravida, third trimester: Secondary | ICD-10-CM | POA: Diagnosis not present

## 2020-12-07 DIAGNOSIS — O43199 Other malformation of placenta, unspecified trimester: Secondary | ICD-10-CM

## 2020-12-07 DIAGNOSIS — Z3A31 31 weeks gestation of pregnancy: Secondary | ICD-10-CM

## 2020-12-07 DIAGNOSIS — O43193 Other malformation of placenta, third trimester: Secondary | ICD-10-CM | POA: Diagnosis not present

## 2020-12-07 DIAGNOSIS — D219 Benign neoplasm of connective and other soft tissue, unspecified: Secondary | ICD-10-CM | POA: Insufficient documentation

## 2020-12-07 DIAGNOSIS — O09813 Supervision of pregnancy resulting from assisted reproductive technology, third trimester: Secondary | ICD-10-CM

## 2020-12-07 DIAGNOSIS — Z362 Encounter for other antenatal screening follow-up: Secondary | ICD-10-CM

## 2020-12-07 DIAGNOSIS — D259 Leiomyoma of uterus, unspecified: Secondary | ICD-10-CM | POA: Diagnosis not present

## 2020-12-07 DIAGNOSIS — O99213 Obesity complicating pregnancy, third trimester: Secondary | ICD-10-CM

## 2020-12-08 ENCOUNTER — Encounter: Payer: Self-pay | Admitting: Obstetrics and Gynecology

## 2020-12-08 DIAGNOSIS — O43199 Other malformation of placenta, unspecified trimester: Secondary | ICD-10-CM | POA: Insufficient documentation

## 2020-12-08 NOTE — Progress Notes (Signed)
Prenatal Visit Note Date: 12/07/2020 Clinic: [facility]  Subjective:  Jaclyn Gray is a 39 y.o. G1P0000 at [redacted]w[redacted]d being seen today for ongoing prenatal care.  She is currently monitored for the following issues for this high-risk pregnancy and has Normal pap with positive high risk HPV on 07/18/2019; Varicose veins of both lower extremities with inflammation; At risk for fertility problems; Dysuria; Right anterior shoulder pain; Generalized anxiety disorder; Supervision of high risk pregnancy, antepartum; AMA (advanced maternal age) primigravida 35+; ASCUS with positive high risk HPV cervical on 07/21/2020; Uterine leiomyoma; and Stress at work on their problem list.  Patient reports no complaints.   Contractions: Not present. Vag. Bleeding: None.  Movement: Present. Denies leaking of fluid.   The following portions of the patient's history were reviewed and updated as appropriate: allergies, current medications, past family history, past medical history, past social history, past surgical history and problem list. Problem list updated.  Objective:   Vitals:   12/07/20 1445  BP: 108/73  Pulse: (!) 120  Weight: 176 lb (79.8 kg)    Fetal Status: Fetal Heart Rate (bpm): 144   Movement: Present     General:  Alert, oriented and cooperative. Patient is in no acute distress.  Skin: Skin is warm and dry. No rash noted.   Cardiovascular: Normal heart rate noted  Respiratory: Normal respiratory effort, no problems with respiration noted  Abdomen: Soft, gravid, appropriate for gestational age. Pain/Pressure: Absent     Pelvic:  Cervical exam deferred        Extremities: Normal range of motion.  Edema: None  Mental Status: Normal mood and affect. Normal behavior. Normal judgment and thought content.   Urinalysis:      Assessment and Plan:  Pregnancy: G1P0000 at [redacted]w[redacted]d  1. [redacted] weeks gestation of pregnancy Routine care. Marginal cord insertion. Normal growth, afi  today: 47%, 1735gm, ac 70%, afi 14. Fibroids stable  Preterm labor symptoms and general obstetric precautions including but not limited to vaginal bleeding, contractions, leaking of fluid and fetal movement were reviewed in detail with the patient. Please refer to After Visit Summary for other counseling recommendations.  Return in about 2 weeks (around 12/21/2020) for in person, md or app.   Aletha Halim, MD

## 2020-12-21 ENCOUNTER — Ambulatory Visit (INDEPENDENT_AMBULATORY_CARE_PROVIDER_SITE_OTHER): Admitting: Obstetrics & Gynecology

## 2020-12-21 ENCOUNTER — Other Ambulatory Visit: Payer: Self-pay

## 2020-12-21 ENCOUNTER — Encounter: Admitting: Obstetrics & Gynecology

## 2020-12-21 ENCOUNTER — Encounter: Payer: Self-pay | Admitting: Obstetrics & Gynecology

## 2020-12-21 VITALS — BP 117/77 | HR 88 | Wt 175.4 lb

## 2020-12-21 DIAGNOSIS — O0990 Supervision of high risk pregnancy, unspecified, unspecified trimester: Secondary | ICD-10-CM

## 2020-12-21 DIAGNOSIS — O43199 Other malformation of placenta, unspecified trimester: Secondary | ICD-10-CM

## 2020-12-21 DIAGNOSIS — Z3A33 33 weeks gestation of pregnancy: Secondary | ICD-10-CM

## 2020-12-21 DIAGNOSIS — O09513 Supervision of elderly primigravida, third trimester: Secondary | ICD-10-CM

## 2020-12-21 NOTE — Progress Notes (Signed)
   PRENATAL VISIT NOTE  Subjective:  Jaclyn Gray is a 39 y.o. G1P0000 at [redacted]w[redacted]d being seen today for ongoing prenatal care.  She is currently monitored for the following issues for this high-risk pregnancy and has Normal pap with positive high risk HPV on 07/18/2019; Varicose veins of both lower extremities with inflammation; Generalized anxiety disorder; Supervision of high risk pregnancy, antepartum; AMA (advanced maternal age) primigravida 35+; ASCUS with positive high risk HPV cervical on 07/21/2020; Uterine leiomyoma; Stress at work; and Marginal insertion of umbilical cord affecting management of mother on their problem list.  Patient reports no complaints.  Contractions: Not present. Vag. Bleeding: None.  Movement: Present. Denies leaking of fluid.   The following portions of the patient's history were reviewed and updated as appropriate: allergies, current medications, past family history, past medical history, past social history, past surgical history and problem list.   Objective:   Vitals:   12/21/20 1013  BP: 117/77  Pulse: 88  Weight: 175 lb 6.4 oz (79.6 kg)    Fetal Status: Fetal Heart Rate (bpm): 141   Movement: Present     General:  Alert, oriented and cooperative. Patient is in no acute distress.  Skin: Skin is warm and dry. No rash noted.   Cardiovascular: Normal heart rate noted  Respiratory: Normal respiratory effort, no problems with respiration noted  Abdomen: Soft, gravid, appropriate for gestational age.  Pain/Pressure: Absent     Pelvic: Cervical exam deferred        Extremities: Normal range of motion.  Edema: None  Mental Status: Normal mood and affect. Normal behavior. Normal judgment and thought content.   Assessment and Plan:  Pregnancy: G1P0000 at [redacted]w[redacted]d 1. Marginal insertion of umbilical cord affecting management of mother Growth scan scheduled on 01/11/21, follow up MFM recommendations.  2. [redacted] weeks gestation of pregnancy 3. Primigravida of  advanced maternal age in third trimester 4. Supervision of high risk pregnancy, antepartum Interested in waterbirth, already took class, will meet with CNM at 36 weeks. Preterm labor symptoms and general obstetric precautions including but not limited to vaginal bleeding, contractions, leaking of fluid and fetal movement were reviewed in detail with the patient. Please refer to After Visit Summary for other counseling recommendations.   Return in about 2 weeks (around 01/04/2021) for OFFICE OB VISIT (MD or APP) - can be virtual if desired by patient.  Future Appointments  Date Time Provider Department Center  01/04/2021 10:15 AM Tereso Newcomer, MD [facility]  01/11/2021  2:15 PM [facility] NURSE [facility]  01/11/2021  2:30 PM [facility] US3 [facility]  01/12/2021 10:50 AM Calvert Cantor, CNM [facility]  01/18/2021 10:00 AM [place] Bing, MD [facility]  01/20/2021  2:45 PM Theotis Burrow, NP [facility] None  10/12/2021 11:00 AM Theotis Burrow, NP [facility] None    Jaynie [place], MD

## 2020-12-21 NOTE — Patient Instructions (Addendum)
Return to office for any scheduled appointments. Call the office or go to the MAU at [facility] if:  You begin to have strong, frequent contractions  Your water breaks.  Sometimes it is a big gush of fluid, sometimes it is just a trickle that keeps getting your panties wet or running down your legs  You have vaginal bleeding.  It is normal to have a small amount of spotting if your cervix was checked.   You do not feel your baby moving like normal.  If you do not, get something to eat and drink and lay down and focus on feeling your baby move.   If your baby is still not moving like normal, you should call the office or go to MAU.  Any other obstetric concerns.     Considering Waterbirth? Guide for patients at Center for [facility] Why consider waterbirth? . Gentle birth for babies  . Less pain medicine used in labor  . May allow for passive descent/less pushing  . May reduce perineal tears  . More mobility and instinctive maternal position changes  . Increased maternal relaxation   Is waterbirth safe? What are the risks of infection, drowning or other complications? . Infection:  Marland Kitchen Very low risk (3.7 % for tub vs 4.8% for bed)  . 7 in 8000 waterbirths with documented infection  . Poorly cleaned equipment most common cause  . Slightly lower group B strep transmission rate  . Drowning  . Maternal:  . Very low risk  . Related to seizures or fainting  . Newborn:  Marland Kitchen Very low risk. No evidence of increased risk of respiratory problems in multiple large studies  . Physiological protection from breathing under water  . Avoid underwater birth if there are any fetal complications  . Once baby's head is out of the water, keep it out.  . Birth complication  . Some reports of cord trauma, but risk decreased by bringing baby to surface gradually  . No evidence of increased risk of shoulder dystocia. Mothers can usually change positions faster  in water than in a bed, possibly aiding the maneuvers to free the shoulder.   There are 2 things you MUST do to have a waterbirth with [facility]: 1. Attend a waterbirth class at [facility]   a. 3rd Wednesday of every month from 7-9 pm (virtual during COVID) b. Free c. Register by calling [phone] or register online at [email or web address] d. Bring [place] the certificate from the class to your prenatal appointment or send via MyChart 2. Meet with a midwife at 36 weeks* to see if you can still plan a waterbirth and to sign the consent.   *We also recommend that you schedule as many of your prenatal visits with a midwife as possible.    Helpful information: . You may want to bring a bathing suit top to the hospital to wear during labor but this is optional.  All other supplies are provided by the hospital. . Please arrive at the hospital with signs of active labor, and do not wait at home until late in labor. It takes 45 min- 2 hours for COVID testing, fetal monitoring, and check in to your room to take place, plus transport and filling of the waterbirth tub.    Things that would prevent you from having a waterbirth: . Unknown or Positive COVID-19 diagnosis upon admission to hospital* . Premature, <37wks  . Previous cesarean  birth  . Presence of thick meconium-stained fluid  . Multiple gestation (Twins, triplets, etc.)  . Uncontrolled diabetes or gestational diabetes requiring medication  . Hypertension diagnosed in pregnancy or preexisting hypertension (gestational hypertension, preeclampsia, or chronic hypertension) . Heavy vaginal bleeding  . Non-reassuring fetal heart rate  . Active infection (MRSA, etc.). Group B Strep is NOT a contraindication for waterbirth.  . If your labor has to be induced and induction method requires continuous monitoring of the baby's heart rate  . Other risks/issues identified by your obstetrical provider   Please remember that  birth is unpredictable. Under certain unforeseeable circumstances your provider may advise against giving birth in the tub. These decisions will be made on a case-by-case basis and with the safety of you and your baby as our highest priority.   *Please remember that in order to have a waterbirth, you must test Negative to COVID-19 upon admission to the hospital.  Updated 11/09/2020

## 2020-12-25 NOTE — L&D Delivery Note (Addendum)
OB/GYN Faculty Practice Delivery Note  Jaclyn Gray is a 40 y.o. G1P0000 s/p SVD at [redacted]w[redacted]d She was admitted for SOL.   ROM: 14h 045mith thick meconium stained fluid with particulate GBS Status: Negative Maximum Maternal Temperature: 98.9  Labor Progress: Presented with spontaneous onset of labor.  Had some elevated blood pressures and met criteria for gestational hypertension.  Pre-eclampsia labs were negative.  AROM at 1230 on 2/15 with thick meconium stained fluid with particulate.  Pitocin was started at 1600 on 2/15.  Had protracted active stage of labor.  Then progressed to complete without further complication.  Delivery Date/Time: 02/09/2021 at 0238 Delivery: Called to room and patient was complete and pushing. Head delivered LOA. No nuchal cord present. Shoulder and body delivered in usual fashion. Infant was placed on mother's abdomen, dried and stimulated. Due to delayed cry, infant was taken to the warmer for further evaluation by NICU team.  Cord clamped x 2 after 1-minute delay, and cut by patient's mother under my direct supervision. Cord blood drawn. Placenta delivered spontaneously with gentle cord traction. Fundus firm with massage and Pitocin. Labia, perineum, vagina, and cervix were inspected, found to have a 2nd degree perineal laceration.   Placenta: Intact, 3 vessel cord, marginal cord insertion.  To be sent home with patient per her request.  Complications: Protracted second stage of labor.  No delivery complications. Lacerations: 2nd degree perineal that was repaired with 3-0 Vicryl Rapide with hemostasis noted.   EBL: 375 cc Analgesia: Epidural  Infant: Viable female  APGARs 7 & 9  weight pending   Jaclyn Gray Madelin HeadingsMD 02/09/2021, 3:16 AM   Attestation:  I confirm that I have verified the information documented in the resident's note and that I have also personally reperformed the physical exam and all medical decision making activities.   I was gloved and  present for entire delivery SVD without incident No difficulty with shoulders Lacerations as listed above Repair of same supervised by me  MaSeabron SpatesCNM

## 2021-01-04 ENCOUNTER — Other Ambulatory Visit: Payer: Self-pay

## 2021-01-04 ENCOUNTER — Telehealth (INDEPENDENT_AMBULATORY_CARE_PROVIDER_SITE_OTHER): Admitting: Obstetrics & Gynecology

## 2021-01-04 VITALS — BP 122/86 | HR 92 | Wt 178.0 lb

## 2021-01-04 DIAGNOSIS — O43193 Other malformation of placenta, third trimester: Secondary | ICD-10-CM

## 2021-01-04 DIAGNOSIS — O43199 Other malformation of placenta, unspecified trimester: Secondary | ICD-10-CM

## 2021-01-04 DIAGNOSIS — Z3A35 35 weeks gestation of pregnancy: Secondary | ICD-10-CM

## 2021-01-04 DIAGNOSIS — O0990 Supervision of high risk pregnancy, unspecified, unspecified trimester: Secondary | ICD-10-CM

## 2021-01-04 DIAGNOSIS — O09513 Supervision of elderly primigravida, third trimester: Secondary | ICD-10-CM

## 2021-01-04 DIAGNOSIS — O09512 Supervision of elderly primigravida, second trimester: Secondary | ICD-10-CM

## 2021-01-04 NOTE — Progress Notes (Signed)
    TELEHEALTH OBSTETRICS VISIT ENCOUNTER NOTE  Provider location: [facility] [place] [facility]   I connected with Jaclyn Gray on 01/04/21 at 10:15 AM EST by telephone at home and verified that I am speaking with the correct person using two identifiers. Unable to do video encounter due to technical difficulties.   I discussed the limitations, risks, security and privacy concerns of performing an evaluation and management service by telephone and the availability of in person appointments. I also discussed with the patient that there may be a patient responsible charge related to this service. The patient expressed understanding and agreed to proceed.  Subjective:  Jaclyn Gray is a 40 y.o. G1P0000 at [redacted]w[redacted]d being followed for ongoing prenatal care.  She is currently monitored for the following issues for this high-risk pregnancy and has Normal pap with positive high risk HPV on 07/18/2019; Varicose veins of both lower extremities with inflammation; Generalized anxiety disorder; Supervision of high risk pregnancy, antepartum; AMA (advanced maternal age) primigravida 35+; ASCUS with positive high risk HPV cervical on 07/21/2020; Uterine leiomyoma; Stress at work; and Marginal insertion of umbilical cord affecting management of mother on their problem list.  Patient reports no complaints. Reports fetal movement. Denies any contractions, bleeding or leaking of fluid.   The following portions of the patient's history were reviewed and updated as appropriate: allergies, current medications, past family history, past medical history, past social history, past surgical history and problem list.   Objective:   General:  Alert, oriented and cooperative.   Mental Status: Normal mood and affect perceived. Normal judgment and thought content.  Rest of physical exam deferred due to type of encounter  Assessment and Plan:  Pregnancy: G1P0000 at [redacted]w[redacted]d 1. Marginal insertion of  umbilical cord affecting management of mother Next growth scan on 01/11/2021.  2. [redacted] weeks gestation of pregnancy 3. Primigravida of advanced maternal age in second trimester 4. Supervision of high risk pregnancy, antepartum Will meet with CNM to discuss waterbirth next visit, get pelvic cultures Reviewed birth plan, totally reasonable. Preterm labor symptoms and general obstetric precautions including but not limited to vaginal bleeding, contractions, leaking of fluid and fetal movement were reviewed in detail with the patient.  I discussed the assessment and treatment plan with the patient. The patient was provided an opportunity to ask questions and all were answered. The patient agreed with the plan and demonstrated an understanding of the instructions. The patient was advised to call back or seek an in-person office evaluation/go to MAU at [facility] for any urgent or concerning symptoms. Please refer to After Visit Summary for other counseling recommendations.   I provided 10 minutes of non-face-to-face time during this encounter.  Return for OFFICE OB VISIT (MD or APP), Pelvic cultures.  Future Appointments  Date Time Provider [place]  01/11/2021  2:15 PM [facility] NURSE [facility]  01/11/2021  2:30 PM [facility] US3 [facility]  01/12/2021 10:50 AM Darlina Rumpf, CNM [facility]  01/18/2021 10:00 AM Aletha Halim, MD [facility]  01/20/2021  2:45 PM Luiz Ochoa, NP [facility] None  10/12/2021 11:00 AM Luiz Ochoa, NP [facility] None    Verita Schneiders, MD Center for [facility], [place]

## 2021-01-04 NOTE — Patient Instructions (Signed)
Return to office for any scheduled appointments. Call the office or go to the [facility] [place] if:  You begin to have strong, frequent contractions  Your water breaks.  Sometimes it is a big gush of fluid, sometimes it is just a trickle that keeps getting your panties wet or running down your legs  You have vaginal bleeding.  It is normal to have a small amount of spotting if your cervix was checked.   You do not feel your baby moving like normal.  If you do not, get something to eat and drink and lay down and focus on feeling your baby move.   If your baby is still not moving like normal, you should call the office or go to MAU.  Any other obstetric concerns.   

## 2021-01-11 ENCOUNTER — Other Ambulatory Visit: Payer: Self-pay

## 2021-01-11 ENCOUNTER — Encounter: Admitting: Obstetrics & Gynecology

## 2021-01-11 ENCOUNTER — Encounter: Payer: Self-pay | Admitting: *Deleted

## 2021-01-11 ENCOUNTER — Ambulatory Visit: Admitting: *Deleted

## 2021-01-11 ENCOUNTER — Ambulatory Visit: Attending: Obstetrics and Gynecology

## 2021-01-11 VITALS — BP 124/77 | HR 84

## 2021-01-11 DIAGNOSIS — O09523 Supervision of elderly multigravida, third trimester: Secondary | ICD-10-CM

## 2021-01-11 DIAGNOSIS — O43199 Other malformation of placenta, unspecified trimester: Secondary | ICD-10-CM | POA: Diagnosis present

## 2021-01-11 DIAGNOSIS — D259 Leiomyoma of uterus, unspecified: Secondary | ICD-10-CM

## 2021-01-11 DIAGNOSIS — E669 Obesity, unspecified: Secondary | ICD-10-CM

## 2021-01-11 DIAGNOSIS — O09813 Supervision of pregnancy resulting from assisted reproductive technology, third trimester: Secondary | ICD-10-CM

## 2021-01-11 DIAGNOSIS — Z362 Encounter for other antenatal screening follow-up: Secondary | ICD-10-CM

## 2021-01-11 DIAGNOSIS — O99213 Obesity complicating pregnancy, third trimester: Secondary | ICD-10-CM

## 2021-01-11 DIAGNOSIS — O3413 Maternal care for benign tumor of corpus uteri, third trimester: Secondary | ICD-10-CM

## 2021-01-11 DIAGNOSIS — O43193 Other malformation of placenta, third trimester: Secondary | ICD-10-CM

## 2021-01-11 DIAGNOSIS — Z3A36 36 weeks gestation of pregnancy: Secondary | ICD-10-CM

## 2021-01-11 NOTE — Telephone Encounter (Signed)
Lipid profile will be dicussed on next visit

## 2021-01-11 NOTE — Progress Notes (Signed)
She just had her baby, please reschedule her app on 1/27

## 2021-01-12 ENCOUNTER — Ambulatory Visit (INDEPENDENT_AMBULATORY_CARE_PROVIDER_SITE_OTHER): Admitting: Advanced Practice Midwife

## 2021-01-12 ENCOUNTER — Other Ambulatory Visit (HOSPITAL_COMMUNITY)
Admission: RE | Admit: 2021-01-12 | Discharge: 2021-01-12 | Disposition: A | Discharge status: Home / Self Care | Source: Ambulatory Visit | Attending: Obstetrics & Gynecology | Admitting: Obstetrics & Gynecology

## 2021-01-12 VITALS — BP 126/81 | HR 101 | Wt 180.2 lb

## 2021-01-12 DIAGNOSIS — O0990 Supervision of high risk pregnancy, unspecified, unspecified trimester: Secondary | ICD-10-CM | POA: Insufficient documentation

## 2021-01-12 DIAGNOSIS — O43199 Other malformation of placenta, unspecified trimester: Secondary | ICD-10-CM

## 2021-01-12 DIAGNOSIS — Z3A36 36 weeks gestation of pregnancy: Secondary | ICD-10-CM

## 2021-01-12 LAB — OB RESULTS CONSOLE GC/CHLAMYDIA: Gonorrhea: NEGATIVE

## 2021-01-12 NOTE — Patient Instructions (Signed)
Considering Waterbirth? Guide for patients at [facility] ([facility]) Why consider waterbirth? . Gentle birth for babies  . Less pain medicine used in labor  . May allow for passive descent/less pushing  . May reduce perineal tears  . More mobility and instinctive maternal position changes  . Increased maternal relaxation   Is waterbirth safe? What are the risks of infection, drowning or other complications? . Infection:  . Very low risk (3.7 % for tub vs 4.8% for bed)  . 7 in 8000 waterbirths with documented infection  . Poorly cleaned equipment most common cause  . Slightly lower group B strep transmission rate  . Drowning  . Maternal:  . Very low risk  . Related to seizures or fainting  . Newborn:  . Very low risk. No evidence of increased risk of respiratory problems in multiple large studies  . Physiological protection from breathing under water  . Avoid underwater birth if there are any fetal complications  . Once baby's head is out of the water, keep it out.  . Birth complication  . Some reports of cord trauma, but risk decreased by bringing baby to surface gradually  . No evidence of increased risk of shoulder dystocia. Mothers can usually change positions faster in water than in a bed, possibly aiding the maneuvers to free the shoulder.   There are 2 things you MUST do to have a waterbirth with [facility]: 1. Attend a waterbirth class at [facility] [place]   a. 3rd Wednesday of every month from 7-9 pm (virtual during COVID) b. Free c. Register by calling [phone] or register online at [email or web address] d. Bring us the certificate from the class to your prenatal appointment or send via MyChart 2. Meet with a midwife at 36 weeks* to see if you can still plan a waterbirth and to sign the consent.   *We also recommend that you schedule as many of your prenatal visits with a midwife as possible.    Helpful information: . You may  want to bring a bathing suit top to the hospital to wear during labor but this is optional.  All other supplies are provided by the hospital. . Please arrive at the hospital with signs of active labor, and do not wait at home until late in labor. It takes 45 min- 2 hours for COVID testing, fetal monitoring, and check in to your room to take place, plus transport and filling of the waterbirth tub.    Things that would prevent you from having a waterbirth: . Unknown or Positive COVID-19 diagnosis upon admission to hospital* . Premature, <37wks  . Previous cesarean birth  . Presence of thick meconium-stained fluid  . Multiple gestation (Twins, triplets, etc.)  . Uncontrolled diabetes or gestational diabetes requiring medication  . Hypertension diagnosed in pregnancy or preexisting hypertension (gestational hypertension, preeclampsia, or chronic hypertension) . Heavy vaginal bleeding  . Non-reassuring fetal heart rate  . Active infection (MRSA, etc.). Group B Strep is NOT a contraindication for waterbirth.  . If your labor has to be induced and induction method requires continuous monitoring of the baby's heart rate  . Other risks/issues identified by your obstetrical provider   Please remember that birth is unpredictable. Under certain unforeseeable circumstances your provider may advise against giving birth in the tub. These decisions will be made on a case-by-case basis and with the safety of you and your baby as our highest priority.   *Please remember that in order   to have a waterbirth, you must test Negative to COVID-19 upon admission to the hospital.  Updated 11/09/2020  

## 2021-01-12 NOTE — Progress Notes (Signed)
   PRENATAL VISIT NOTE  Subjective:  Jaclyn Gray is a 40 y.o. G1P0000 at [redacted]w[redacted]d being seen today for ongoing prenatal care.  She is currently monitored for the following issues for this high-risk pregnancy and has Normal pap with positive high risk HPV on 07/18/2019; Varicose veins of both lower extremities with inflammation; Generalized anxiety disorder; Supervision of high risk pregnancy, antepartum; AMA (advanced maternal age) primigravida 35+; ASCUS with positive high risk HPV cervical on 07/21/2020; Uterine leiomyoma; Stress at work; and Marginal insertion of umbilical cord affecting management of mother on their problem list.  Patient reports no complaints. Considering waterbirth and has class soon.  Contractions: Not present. Vag. Bleeding: None.  Movement: Present. Denies leaking of fluid.   The following portions of the patient's history were reviewed and updated as appropriate: allergies, current medications, past family history, past medical history, past social history, past surgical history and problem list. Problem list updated.  Objective:   Vitals:   01/12/21 1059  BP: 126/81  Pulse: (!) 101  Weight: 180 lb 3.2 oz (81.7 kg)    Fetal Status: Fetal Heart Rate (bpm): 145 Fundal Height: 37 cm Movement: Present  Presentation: Vertex  General:  Alert, oriented and cooperative. Patient is in no acute distress.  Skin: Skin is warm and dry. No rash noted.   Cardiovascular: Normal heart rate noted  Respiratory: Normal respiratory effort, no problems with respiration noted  Abdomen: Soft, gravid, appropriate for gestational age.  Pain/Pressure: Absent     Pelvic: Cervical exam performed Dilation: Closed Effacement (%): Thick Station: Ballotable  Extremities: Normal range of motion.  Edema: None  Mental Status: Normal mood and affect. Normal behavior. Normal judgment and thought content.   Assessment and Plan:  Pregnancy: G1P0000 at [redacted]w[redacted]d  1. Supervision of high risk  pregnancy, antepartum - No acute concerns - Waterbirth class soon, will sign consent PRN next visit with me - Confirmed patient knows location of MAU for labor triage - Strep Gp B NAA - GC/Chlamydia probe amp ([place])not at [facility]  2. [redacted] weeks gestation of pregnancy   3. Marginal insertion of umbilical cord affecting management of mother - Patient states she was unaware of this diagnosis. Believed her serial scans were for monitoring fibroids  Preterm labor symptoms and general obstetric precautions including but not limited to vaginal bleeding, contractions, leaking of fluid and fetal movement were reviewed in detail with the patient. Please refer to After Visit Summary for other counseling recommendations.  No follow-ups on file.  Future Appointments  Date Time Provider [place]  01/18/2021 10:00 AM Aletha Halim, MD [facility]  01/20/2021  2:45 PM Luiz Ochoa, NP [facility] None  01/26/2021  9:10 AM Darlina Rumpf, CNM [facility]  02/03/2021 10:15 AM Aletha Halim, MD [facility]  10/12/2021 11:00 AM Luiz Ochoa, NP [place] None    Darlina Rumpf, CNM

## 2021-01-13 ENCOUNTER — Telehealth: Payer: Self-pay

## 2021-01-13 ENCOUNTER — Telehealth (INDEPENDENT_AMBULATORY_CARE_PROVIDER_SITE_OTHER): Admitting: Family Medicine

## 2021-01-13 ENCOUNTER — Other Ambulatory Visit: Payer: Self-pay

## 2021-01-13 DIAGNOSIS — Z3A36 36 weeks gestation of pregnancy: Secondary | ICD-10-CM

## 2021-01-13 DIAGNOSIS — O43193 Other malformation of placenta, third trimester: Secondary | ICD-10-CM

## 2021-01-13 DIAGNOSIS — O43199 Other malformation of placenta, unspecified trimester: Secondary | ICD-10-CM

## 2021-01-13 DIAGNOSIS — O0990 Supervision of high risk pregnancy, unspecified, unspecified trimester: Secondary | ICD-10-CM

## 2021-01-13 LAB — GC/CHLAMYDIA PROBE AMP (~~LOC~~) NOT AT ARMC
Chlamydia: NEGATIVE
Comment: NEGATIVE
Comment: NORMAL
Neisseria Gonorrhea: NEGATIVE

## 2021-01-13 NOTE — Telephone Encounter (Signed)
Patient called requesting new referrals for new OB and a chiropractor for pregnancy, I advised pt she needs to call and see if a new provider will be to accept her for new ob since she is 37 weeks and so far along the way in pregnancy and would need to make sure that is the best option. Patient will call back with names and locations for tricare auth if still needed. Jaclyn Gray

## 2021-01-13 NOTE — Progress Notes (Signed)
Questions about ultrasounds- was seen in the office yesterday

## 2021-01-13 NOTE — Progress Notes (Signed)
OBSTETRICS PRENATAL VIRTUAL VISIT ENCOUNTER NOTE  Provider location: [facility]   I connected with Jaclyn Gray on 01/13/21 at  3:15 PM EST by MyChart Video Encounter at home and verified that I am speaking with the correct person using two identifiers.   I discussed the limitations, risks, security and privacy concerns of performing an evaluation and management service virtually and the availability of in person appointments. I also discussed with the patient that there may be a patient responsible charge related to this service. The patient expressed understanding and agreed to proceed. Subjective:  Jaclyn Gray is a 40 y.o. G1P0000 at [redacted]w[redacted]d being seen today for ongoing prenatal care.  She is currently monitored for the following issues for this high-risk pregnancy and has Normal pap with positive high risk HPV on 07/18/2019; Varicose veins of both lower extremities with inflammation; Generalized anxiety disorder; Supervision of high risk pregnancy, antepartum; AMA (advanced maternal age) primigravida 35+; ASCUS with positive high risk HPV cervical on 07/21/2020; Uterine leiomyoma; Stress at work; and Marginal insertion of umbilical cord affecting management of mother on their problem list.  Patient reports no complaints.   .  .   . Denies any leaking of fluid.   The following portions of the patient's history were reviewed and updated as appropriate: allergies, current medications, past family history, past medical history, past social history, past surgical history and problem list.   Objective:  There were no vitals filed for this visit.  Fetal Status:           General:  Alert, oriented and cooperative. Patient is in no acute distress.  Respiratory: Normal respiratory effort, no problems with respiration noted  Mental Status: Normal mood and affect. Normal behavior. Normal judgment and thought content.  Rest of physical exam deferred due to  type of encounter  Imaging: [place] MFM OB FOLLOW UP  Result Date: 01/11/2021 ----------------------------------------------------------------------  OBSTETRICS REPORT                       (Signed Final 01/11/2021 03:26 pm) ---------------------------------------------------------------------- Patient Info  ID #:       [id number]                          D.O.B.:  1981-03-13 (40 yrs)  Name:       Jaclyn Gray              Visit Date: 01/11/2021 02:27 pm ---------------------------------------------------------------------- Performed By  Attending:        Johnell Comings MD         Ref. Address:     Faculty  Performed By:     Jaclyn Gray BS,       [facility]  Referred By:      Vickii Chafe                    CONSTANT MD ---------------------------------------------------------------------- Orders  #  Description                           Code        Ordered By  1  [place] MFM OB FOLLOW UP                   [id number]    [place] ----------------------------------------------------------------------  #  Order #                     Accession #                Episode #  1  [id number]                   [id number]                 [id number] ---------------------------------------------------------------------- Indications  Marginal insertion of umbilical cord affecting O43.193  management of mother in third trimester  [redacted] weeks gestation of pregnancy                Z3A.36  Uterine fibroids affecting pregnancy in third  O34.13, D25.9  trimester, antepartum  Encounter for other antenatal screening        Z36.2  follow-up  Advanced maternal age multigravida 51+,        O93.523  third trimester (38)  Pregnancy resulting from assisted              O6.819  reproductive technology (IUI w/ donor)  Obesity  complicating pregnancy, third          O99.213  trimester (Pre preg BMI 31) ---------------------------------------------------------------------- Vital Signs                                                 Height:        5'1" ---------------------------------------------------------------------- Fetal Evaluation  Num Of Fetuses:         1  Fetal Heart Rate(bpm):  155  Cardiac Activity:       Observed  Presentation:           Cephalic  Placenta:               Anterior  P. Cord Insertion:      Marginal insertion previously  Amniotic Fluid  AFI FV:      Subjectively low-normal  AFI Sum(cm)     %Tile       Largest Pocket(cm)  17              63          5.4  RUQ(cm)       RLQ(cm)       LUQ(cm)        LLQ(cm)  3.3  5.4           3.6            4.7 ---------------------------------------------------------------------- Biometry  BPD:      86.1  mm     G. Age:  34w 5d         23  %    CI:        86.03   %    70 - 86                                                          FL/HC:      23.7   %    20.1 - 22.1  HC:      292.3  mm     G. Age:  32w 2d        < 1  %    HC/AC:      0.87        0.93 - 1.11  AC:      334.4  mm     G. Age:  37w 2d         90  %    FL/BPD:     80.4   %    71 - 87  FL:       69.2  mm     G. Age:  35w 4d         33  %    FL/AC:      20.7   %    20 - 24  LV:        4.6  mm  Est. FW:    2825  gm      6 lb 4 oz     51  % ---------------------------------------------------------------------- OB History  Gravidity:    1 ---------------------------------------------------------------------- Gestational Age  LMP:           36w 0d        Date:  05/04/20                 EDD:   02/08/21  U/S Today:     35w 0d                                        EDD:   02/15/21  Best:          36w 0d     Det. By:  LMP  (05/04/20)          EDD:   02/08/21 ---------------------------------------------------------------------- Anatomy  Cranium:               Appears normal         LVOT:                   Previously seen   Cavum:                 Previously seen        Aortic Arch:            Previously seen  Ventricles:            Appears normal         Ductal Arch:  Previously seen  Choroid Plexus:        Previously seen        Diaphragm:              Appears normal  Cerebellum:            Previously seen        Stomach:                Appears normal, left                                                                        sided  Posterior Fossa:       Previously seen        Abdomen:                Previously seen  Nuchal Fold:           Not applicable ([id number]    Abdominal Wall:         Previously seen                         wks GA)  Face:                  Orbits and profile     Cord Vessels:           Previously seen                         previously seen  Lips:                  Previously seen        Kidneys:                Appear normal  Palate:                Previously seen        Bladder:                Appears normal  Thoracic:              Previously seen        Spine:                  Previously seen  Heart:                 Appears normal         Upper Extremities:      Previously seen                         (4CH, axis, and                         situs)  RVOT:                  Previously seen        Lower Extremities:      Previously seen  Other:  Female gender previously visualized.  Heels, 5th digit, Nasal bone,          Open hands, 3VV, 3VT, and SVC/IVC visualized previously. ----------------------------------------------------------------------  Cervix Uterus Adnexa  Cervix  Not visualized (advanced GA >24wks)  Uterus  Multiple fibroids noted, see table below.  Right Ovary  Within normal limits.  Left Ovary  Within normal limits.  Cul De Sac  No free fluid seen.  Adnexa  No abnormality visualized. ---------------------------------------------------------------------- Myomas  Site                     L(cm)      W(cm)      D(cm)       Location  Fundus Left              3.5        2.6        4.1          Intramural  Fundus Mid               3          2.9        3.5         Subserosal  Right                    3.3        2.7        3.9         Intramural  Right                    3.7        3.4        3.8         Subserosal  Right                    4.7        4.2        4.7         Intramural ----------------------------------------------------------------------  Blood Flow                  RI       PI       Comments ---------------------------------------------------------------------- Comments  This patient was seen for a follow up growth scan due to a  marginal placental cord insertion and fibroid uterus.  She  denies any problems since her last exam.  She was informed that the fetal growth and amniotic fluid  level appears appropriate for her gestational age.  The size of  the fetal head measures small.  This may be due to the fact  that the fetal head was down low in the uterus and may have  been pushed down due to the fibroids.  Her baby should be  examined after birth to determine if microcephaly is present.  A follow up exam was scheduled in 4 weeks. ----------------------------------------------------------------------                   Johnell Comings, MD Electronically Signed Final Report   01/11/2021 03:26 pm ----------------------------------------------------------------------   Assessment and Plan:  Pregnancy: G1P0000 at [redacted]w[redacted]d 1. Supervision of high risk pregnancy, antepartum Continue routine prenatal care.   2. Marginal insertion of umbilical cord affecting management of mother Discussed implications and timing/method of delivery Also concerned about microcephaly listed in report--poorly measured--will f/u post-natally.  Preterm labor symptoms and general obstetric precautions including but not limited to vaginal bleeding, contractions, leaking of fluid and fetal movement were reviewed in detail with the patient. I discussed the assessment and treatment plan with the patient. The patient  was  provided an opportunity to ask questions and all were answered. The patient agreed with the plan and demonstrated an understanding of the instructions. The patient was advised to call back or seek an in-person office evaluation/[facility] for any urgent or concerning symptoms. Please refer to After Visit Summary for other counseling recommendations.   I provided 15 minutes of face-to-face time during this encounter.  No follow-ups on file.  Future Appointments  Date Time Provider [place]  01/18/2021 10:00 AM Aletha Halim, MD [facility]  01/20/2021 11:15 AM Luiz Ochoa, NP [facility] None  01/26/2021  9:10 AM Darlina Rumpf, CNM [facility]  02/03/2021 10:15 AM Aletha Halim, MD [facility]  10/12/2021 11:00 AM Luiz Ochoa, NP [facility] None    Donnamae Jude, MD [facility] [place]

## 2021-01-14 LAB — STREP GP B NAA: Strep Gp B NAA: NEGATIVE

## 2021-01-18 ENCOUNTER — Encounter: Admitting: Obstetrics and Gynecology

## 2021-01-18 ENCOUNTER — Telehealth (INDEPENDENT_AMBULATORY_CARE_PROVIDER_SITE_OTHER): Admitting: Obstetrics and Gynecology

## 2021-01-18 ENCOUNTER — Other Ambulatory Visit: Payer: Self-pay

## 2021-01-18 VITALS — BP 116/78 | HR 89 | Wt 175.3 lb

## 2021-01-18 DIAGNOSIS — O99893 Other specified diseases and conditions complicating puerperium: Secondary | ICD-10-CM

## 2021-01-18 DIAGNOSIS — D259 Leiomyoma of uterus, unspecified: Secondary | ICD-10-CM

## 2021-01-18 DIAGNOSIS — I8312 Varicose veins of left lower extremity with inflammation: Secondary | ICD-10-CM

## 2021-01-18 DIAGNOSIS — O2203 Varicose veins of lower extremity in pregnancy, third trimester: Secondary | ICD-10-CM

## 2021-01-18 DIAGNOSIS — O283 Abnormal ultrasonic finding on antenatal screening of mother: Secondary | ICD-10-CM

## 2021-01-18 DIAGNOSIS — Z3A37 37 weeks gestation of pregnancy: Secondary | ICD-10-CM

## 2021-01-18 DIAGNOSIS — O3413 Maternal care for benign tumor of corpus uteri, third trimester: Secondary | ICD-10-CM

## 2021-01-18 DIAGNOSIS — O43199 Other malformation of placenta, unspecified trimester: Secondary | ICD-10-CM

## 2021-01-18 DIAGNOSIS — O0990 Supervision of high risk pregnancy, unspecified, unspecified trimester: Secondary | ICD-10-CM

## 2021-01-18 DIAGNOSIS — O43193 Other malformation of placenta, third trimester: Secondary | ICD-10-CM

## 2021-01-18 DIAGNOSIS — O0993 Supervision of high risk pregnancy, unspecified, third trimester: Secondary | ICD-10-CM

## 2021-01-18 DIAGNOSIS — O09513 Supervision of elderly primigravida, third trimester: Secondary | ICD-10-CM

## 2021-01-18 DIAGNOSIS — I8311 Varicose veins of right lower extremity with inflammation: Secondary | ICD-10-CM

## 2021-01-18 DIAGNOSIS — R87810 Cervical high risk human papillomavirus (HPV) DNA test positive: Secondary | ICD-10-CM

## 2021-01-18 NOTE — Progress Notes (Signed)
TELEHEALTH VIRTUAL OBSTETRICS VISIT ENCOUNTER NOTE  Clinic: [facility]  I connected with [name] on 01/18/21 at 10:00 AM EST by telephone at home and verified that I am speaking with the correct person using two identifiers.   I discussed the limitations, risks, security and privacy concerns of performing an evaluation and management service by telephone and the availability of in person appointments. I also discussed with the patient that there may be a patient responsible charge related to this service. The patient expressed understanding and agreed to proceed.  Subjective:  Jaclyn Gray is a 40 y.o. G1P0000 at [redacted]w[redacted]d being followed for ongoing prenatal care.  She is currently monitored for the following issues for this high-risk pregnancy and has Normal pap with positive high risk HPV on 07/18/2019; Varicose veins of both lower extremities with inflammation; Generalized anxiety disorder; Supervision of high risk pregnancy, antepartum; AMA (advanced maternal age) primigravida 35+; ASCUS with positive high risk HPV cervical on 07/21/2020; Uterine leiomyoma; Stress at work; and Marginal insertion of umbilical cord affecting management of mother on their problem list.  Patient reports no complaints. Reports fetal movement. Denies any contractions, bleeding or leaking of fluid.   The following portions of the patient's history were reviewed and updated as appropriate: allergies, current medications, past family history, past medical history, past social history, past surgical history and problem list.   Objective:   Vitals:   01/18/21 1011  BP: 116/78  Pulse: 89  Weight: 175 lb 4.8 oz (79.5 kg)    Babyscripts Data Reviewed: not applicable  General:  Alert, oriented and cooperative.   Mental Status: Normal mood and affect perceived. Normal judgment and thought content.  Rest of physical exam deferred due to type of encounter  Assessment and Plan:   Pregnancy: G1P0000 at [redacted]w[redacted]d 1. Supervision of high risk pregnancy, antepartum Routine care. GBS negative. I d/w her I recommend 39wk IOL due to marginal cord insertion. I d/w her PM on the 7th at the earliest or anytime that following week. Pt needs more time to process. RTC 2-3d for visit to go over this more and potentially set up IOL  I d/w her that this will likely mean not eligible for waterbirth but CNMs can clarify.   Normal 1/18 growth u/s  2. Marginal insertion of umbilical cord affecting management of mother  3. [redacted] weeks gestation of pregnancy  4. Uterine leiomyoma, unspecified location  5. Primigravida of advanced maternal age in third trimester  Term labor symptoms and general obstetric precautions including but not limited to vaginal bleeding, contractions, leaking of fluid and fetal movement were reviewed in detail with the patient.  I discussed the assessment and treatment plan with the patient. The patient was provided an opportunity to ask questions and all were answered. The patient agreed with the plan and demonstrated an understanding of the instructions. The patient was advised to call back or seek an in-person office evaluation/go to MAU at [facility] for any urgent or concerning symptoms. Please refer to After Visit Summary for other counseling recommendations.   I provided 10 minutes of non-face-to-face time during this encounter. The visit was conducted via MyChart-medicine  No follow-ups on file.  Future Appointments  Date Time Provider [place]  01/20/2021 11:15 AM Luiz Ochoa, NP [facility] None  01/26/2021  9:10 AM Darlina Rumpf, CNM [facility]  02/03/2021 10:15 AM Aletha Halim, MD [facility]  10/12/2021 11:00 AM Luiz Ochoa, NP [facility] None  Aletha Halim, MD [facility] [place]

## 2021-01-19 ENCOUNTER — Ambulatory Visit: Admitting: Hospice and Palliative Medicine

## 2021-01-20 ENCOUNTER — Ambulatory Visit: Admitting: Hospice and Palliative Medicine

## 2021-01-20 ENCOUNTER — Encounter: Payer: Self-pay | Admitting: *Deleted

## 2021-01-20 ENCOUNTER — Encounter: Admitting: Obstetrics and Gynecology

## 2021-01-24 ENCOUNTER — Other Ambulatory Visit: Payer: Self-pay | Admitting: *Deleted

## 2021-01-24 MED ORDER — PRENATAL 27-0.8 MG PO TABS: 1.0000 | ORAL_TABLET | Freq: Every day | ORAL | 6 refills | Status: AC

## 2021-01-26 ENCOUNTER — Ambulatory Visit (INDEPENDENT_AMBULATORY_CARE_PROVIDER_SITE_OTHER): Admitting: Advanced Practice Midwife

## 2021-01-26 ENCOUNTER — Other Ambulatory Visit: Payer: Self-pay

## 2021-01-26 VITALS — BP 133/87 | HR 87 | Wt 184.0 lb

## 2021-01-26 DIAGNOSIS — O43199 Other malformation of placenta, unspecified trimester: Secondary | ICD-10-CM

## 2021-01-26 DIAGNOSIS — D259 Leiomyoma of uterus, unspecified: Secondary | ICD-10-CM

## 2021-01-26 DIAGNOSIS — Z3A38 38 weeks gestation of pregnancy: Secondary | ICD-10-CM

## 2021-01-26 DIAGNOSIS — O09513 Supervision of elderly primigravida, third trimester: Secondary | ICD-10-CM

## 2021-01-26 DIAGNOSIS — O0990 Supervision of high risk pregnancy, unspecified, unspecified trimester: Secondary | ICD-10-CM

## 2021-01-26 DIAGNOSIS — O283 Abnormal ultrasonic finding on antenatal screening of mother: Secondary | ICD-10-CM

## 2021-01-26 NOTE — Progress Notes (Signed)
   PRENATAL VISIT NOTE  Subjective:  Jaclyn Gray is a 40 y.o. G1P0000 at [redacted]w[redacted]d being seen today for ongoing prenatal care.  She is currently monitored for the following issues for this high-risk pregnancy and has Normal pap with positive high risk HPV on 07/18/2019; Varicose veins of both lower extremities with inflammation; Generalized anxiety disorder; Supervision of high risk pregnancy, antepartum; AMA (advanced maternal age) primigravida 35+; ASCUS with positive high risk HPV cervical on 07/21/2020; Uterine leiomyoma; Stress at work; Marginal insertion of umbilical cord affecting management of mother; and Small Head Circumference on ultrasound on their problem list.  Patient reports no complaints. She has questions surrounding previous recommendation for IOL at 39 weeks due to marginal cord insertion. Patient verbalizes very strong preference to wait for SOL.  Contractions: Not present. Vag. Bleeding: None.  Movement: Present. Denies leaking of fluid.   The following portions of the patient's history were reviewed and updated as appropriate: allergies, current medications, past family history, past medical history, past social history, past surgical history and problem list. Problem list updated.  Objective:   Vitals:   01/26/21 0916  BP: 133/87  Pulse: 87  Weight: 184 lb (83.5 kg)    Fetal Status: Fetal Heart Rate (bpm): 130   Movement: Present     General:  Alert, oriented and cooperative. Patient is in no acute distress.  Skin: Skin is warm and dry. No rash noted.   Cardiovascular: Normal heart rate noted  Respiratory: Normal respiratory effort, no problems with respiration noted  Abdomen: Soft, gravid, appropriate for gestational age.  Pain/Pressure: Absent     Pelvic: Cervical exam deferred        Extremities: Normal range of motion.  Edema: Trace  Mental Status: Normal mood and affect. Normal behavior. Normal judgment and thought content.   Assessment and Plan:   Pregnancy: G1P0000 at [redacted]w[redacted]d  1. Supervision of high risk pregnancy, antepartum - No acute concerns - Desires waterbirth in event of SOL and  - Discussed eligibility for waterbith with Fatima Blank, [facility] Waterbirth Coordinator. [name] will message Dr. Ihor Dow, Dr. Kennon Rounds and MFM to obtain consensus on need for continuous monitoring for marginal insertion - Patient understands that IOL is contraindication to waterbirth and that we are signing waterbirth consent today but several other variables are in work which may preclude her from waterbirth.   2. [redacted] weeks gestation of pregnancy   3. Marginal insertion of umbilical cord affecting management of mother - Dr. Donalee Citrin, MFM called today. Reviewed patient chart. Per Dr. Donalee Citrin, patient may defer IOL until 40 weeks - IOL scheduled for 40.0 on 02/15 per Dr. Donalee Citrin. Orders placed for Cytotec  4. Small Head Circumference on ultrasound   5. Uterine leiomyoma, unspecified location - Stable  6. Primigravida of advanced maternal age in third trimester   Term labor symptoms and general obstetric precautions including but not limited to vaginal bleeding, contractions, leaking of fluid and fetal movement were reviewed in detail with the patient. Please refer to After Visit Summary for other counseling recommendations.    Future Appointments  Date Time Provider [place]  01/29/2021 10:40 AM [facility] None  02/02/2021 10:00 AM Caren Macadam, MD [facility]  02/08/2021  8:00 AM [facility] [place] [facility] None  10/12/2021 11:00 AM Luiz Ochoa, NP [place] None    Darlina Rumpf, CNM

## 2021-01-26 NOTE — Patient Instructions (Signed)
Labor Induction Labor induction is when steps are taken to cause a pregnant woman to begin the labor process. Most women go into labor on their own between 37 weeks and 42 weeks of pregnancy. When this does not happen, or when there is a medical need for labor to begin, steps may be taken to induce, or bring on, labor. Labor induction causes a pregnant woman's uterus to contract. It also causes the cervix to soften (ripen), open (dilate), and thin out. Usually, labor is not induced before 39 weeks of pregnancy unless there is a medical reason to do so. When is labor induction considered? Labor induction may be right for you if:  Your pregnancy lasts longer than 41 to 42 weeks.  Your placenta is separating from your uterus (placental abruption).  You have a rupture of membranes and your labor does not begin.  You have health problems, like diabetes or high blood pressure (preeclampsia) during your pregnancy.  Your baby has stopped growing or does not have enough amniotic fluid. Before labor induction begins, your health care provider will consider the following factors:  Your medical condition and the baby's condition.  How many weeks you have been pregnant.  How mature the baby's lungs are.  The condition of your cervix.  The position of the baby.  The size of your birth canal. Tell a health care provider about:  Any allergies you have.  All medicines you are taking, including vitamins, herbs, eye drops, creams, and over-the-counter medicines.  Any problems you or your family members have had with anesthetic medicines.  Any surgeries you have had.  Any blood disorders you have.  Any medical conditions you have. What are the risks? Generally, this is a safe procedure. However, problems may occur, including:  Failed induction.  Changes in fetal heart rate, such as being too high, too low, or irregular (erratic).  Infection in the mother or the baby.  Increased risk of  having a cesarean delivery.  Breaking off (abruption) of the placenta from the uterus. This is rare.  Rupture of the uterus. This is very rare.  Your baby could fail to get enough blood flow or oxygen. This can be life-threatening. When induction is needed for medical reasons, the benefits generally outweigh the risks. What happens during the procedure? During the procedure, your health care provider will use one of these methods to induce labor:  Stripping the membranes. In this method, the amniotic sac tissue is gently separated from the cervix. This causes the following to happen: ? Your cervix stretches, which in turn causes the release of prostaglandins. ? Prostaglandins induce labor and cause the uterus to contract. ? This procedure is often done in an office visit. You will be sent home to wait for contractions to begin.  Prostaglandin medicine. This medicine starts contractions and causes the cervix to dilate and ripen. This can be taken by mouth (orally) or by being inserted into the vagina (suppository).  Inserting a small, thin tube (catheter) with a balloon into the vagina and then expanding the balloon with water to dilate the cervix.  Breaking the water. In this method, a small instrument is used to make a small hole in the amniotic sac. This eventually causes the amniotic sac to break. Contractions should begin within a few hours.  Medicine to trigger or strengthen contractions. This medicine is given through an IV that is inserted into a vein in your arm. This procedure may vary among health care providers and hospitals.     Where to find more information  [employer] of [employer]: www.marchofdimes.org  The American College of Obstetricians and Gynecologists: www.acog.org Summary  Labor induction causes a pregnant woman's uterus to contract. It also causes the cervix to soften (ripen), open (dilate), and thin out.  Labor is usually not induced before 39 weeks of pregnancy unless  there is a medical reason to do so.  When induction is needed for medical reasons, the benefits generally outweigh the risks.  Talk with your health care provider about which methods of labor induction are right for you. This information is not intended to replace advice given to you by your health care provider. Make sure you discuss any questions you have with your health care provider. Document Revised: 09/23/2020 Document Reviewed: 09/23/2020 Elsevier Patient Education  2021 Elsevier Inc.  

## 2021-01-29 ENCOUNTER — Other Ambulatory Visit (HOSPITAL_COMMUNITY)

## 2021-02-01 ENCOUNTER — Inpatient Hospital Stay (HOSPITAL_COMMUNITY)

## 2021-02-02 ENCOUNTER — Other Ambulatory Visit: Payer: Self-pay | Admitting: Advanced Practice Midwife

## 2021-02-02 ENCOUNTER — Other Ambulatory Visit: Payer: Self-pay

## 2021-02-02 ENCOUNTER — Ambulatory Visit (INDEPENDENT_AMBULATORY_CARE_PROVIDER_SITE_OTHER): Admitting: Family Medicine

## 2021-02-02 ENCOUNTER — Encounter: Payer: Self-pay | Admitting: Family Medicine

## 2021-02-02 ENCOUNTER — Encounter (HOSPITAL_COMMUNITY): Payer: Self-pay | Admitting: *Deleted

## 2021-02-02 ENCOUNTER — Telehealth (HOSPITAL_COMMUNITY): Payer: Self-pay | Admitting: *Deleted

## 2021-02-02 VITALS — BP 128/85 | HR 99 | Wt 183.4 lb

## 2021-02-02 DIAGNOSIS — O43199 Other malformation of placenta, unspecified trimester: Secondary | ICD-10-CM

## 2021-02-02 DIAGNOSIS — R87610 Atypical squamous cells of undetermined significance on cytologic smear of cervix (ASC-US): Secondary | ICD-10-CM

## 2021-02-02 DIAGNOSIS — O0990 Supervision of high risk pregnancy, unspecified, unspecified trimester: Secondary | ICD-10-CM

## 2021-02-02 DIAGNOSIS — R87810 Cervical high risk human papillomavirus (HPV) DNA test positive: Secondary | ICD-10-CM

## 2021-02-02 DIAGNOSIS — O283 Abnormal ultrasonic finding on antenatal screening of mother: Secondary | ICD-10-CM

## 2021-02-02 DIAGNOSIS — O09513 Supervision of elderly primigravida, third trimester: Secondary | ICD-10-CM

## 2021-02-02 NOTE — Progress Notes (Signed)
   PRENATAL VISIT NOTE  Subjective:  Jaclyn Gray is a 40 y.o. G1P0000 at [redacted]w[redacted]d being seen today for ongoing prenatal care.  She is currently monitored for the following issues for this high-risk pregnancy and has Varicose veins of both lower extremities with inflammation; Generalized anxiety disorder; Supervision of high risk pregnancy, antepartum; AMA (advanced maternal age) primigravida 35+; ASCUS with positive high risk HPV cervical on 07/21/2020; Uterine leiomyoma; Stress at work; Marginal insertion of umbilical cord affecting management of mother; and Small Head Circumference on ultrasound on their problem list.  Patient reports no complaints.  Contractions: Not present. Vag. Bleeding: None.  Movement: Present. Denies leaking of fluid.   The following portions of the patient's history were reviewed and updated as appropriate: allergies, current medications, past family history, past medical history, past social history, past surgical history and problem list.   Objective:   Vitals:   02/02/21 1010  BP: 128/85  Pulse: 99  Weight: 183 lb 6.4 oz (83.2 kg)    Fetal Status: Fetal Heart Rate (bpm): 135 Fundal Height: 39 cm Movement: Present  Presentation: Vertex  General:  Alert, oriented and cooperative. Patient is in no acute distress.  Skin: Skin is warm and dry. No rash noted.   Cardiovascular: Normal heart rate noted  Respiratory: Normal respiratory effort, no problems with respiration noted  Abdomen: Soft, gravid, appropriate for gestational age.  Pain/Pressure: Present     Pelvic: Cervical exam performed in the presence of a chaperone Dilation: Closed Effacement (%): Thick Station: -3  Extremities: Normal range of motion.     Mental Status: Normal mood and affect. Normal behavior. Normal judgment and thought content.   Assessment and Plan:  Pregnancy: G1P0000 at [redacted]w[redacted]d  1. Primigravida of advanced maternal age in third trimester NIP low risk  2. Supervision of high risk  pregnancy, antepartum Up to date Discussed IOL at EDD-- due to small HC, growth lag and AMA. Patient is in agreement Discussed possible outpatient FB and patient would like to think about this. Encouraged to make appt and cancel if not desired.  She is considering WB and discussed that if laboring off FB and cyotec this may be an option.   3. Marginal insertion of umbilical cord affecting management of mother incidental  4. Small Head Circumference on ultrasound Reviewed with patient-- <1% Assure ped team is aware  5. ASCUS with positive high risk HPV cervical on 07/21/2020 Postpartum colpo needed  Term labor symptoms and general obstetric precautions including but not limited to vaginal bleeding, contractions, leaking of fluid and fetal movement were reviewed in detail with the patient. Please refer to After Visit Summary for other counseling recommendations.   Return in about 5 days (around 02/07/2021) for for possible outpatient foley balloon placement.  Future Appointments  Date Time Provider [place]  02/07/2021 10:10 AM [facility] None  02/07/2021  2:00 PM [facility] NURSE [facility]  02/07/2021  3:00 PM Anyanwu, Sallyanne Havers, MD [facility]  02/07/2021  3:30 PM [facility] NURSE [facility]  02/08/2021  8:00 AM MC-LD SCHED ROOM [facility] None  10/12/2021 11:00 AM Luiz Ochoa, NP [facility] None    Caren Macadam, MD

## 2021-02-02 NOTE — Patient Instructions (Signed)
These supplements and herbs are available over the counter without a prescription. They are often in the vitamin section of a pharmacy.   For pregnancy Red Raspberry Leaf- Two 300mg  or 400mg  tablets with each meal, 2-3 times a day  Blood Builder Magnesium __________________________________________________________________________________________  To help ripen your Cervix (to get your cervix ready for labor):   Red Raspberry Leaf capsules:  two 300mg  or 400mg  tablets with each meal, 2-3 times a day  Potential Side Effects Of Raspberry Leaf:  Most women do not experience any side effects from drinking raspberry leaf tea. However, nausea and loose stools are possible     Evening Primrose Oil capsules: may take 1 to 3 capsules daily. May also prick one to release the oil and insert it into your vagina at night.  One regimen would be to take 1 tablets three times per day and place 2 tablets in the vagina at night.   Some of the potential side effects:  Upset stomach  Loose stools or diarrhea  Headaches  Nausea    4 Dates a day (may taste better if warmed in microwave until soft). Found where raisins are in the grocery store _____________________________________________________________________________________________

## 2021-02-02 NOTE — Telephone Encounter (Signed)
Preadmission screen  

## 2021-02-03 ENCOUNTER — Encounter: Admitting: Obstetrics and Gynecology

## 2021-02-06 ENCOUNTER — Other Ambulatory Visit: Payer: Self-pay

## 2021-02-06 ENCOUNTER — Encounter (HOSPITAL_COMMUNITY): Payer: Self-pay | Admitting: Obstetrics and Gynecology

## 2021-02-06 ENCOUNTER — Inpatient Hospital Stay (EMERGENCY_DEPARTMENT_HOSPITAL)
Admission: AD | Admit: 2021-02-06 | Discharge: 2021-02-06 | Disposition: A | Discharge status: Home / Self Care | Source: Home / Self Care | Attending: Obstetrics and Gynecology | Admitting: Obstetrics and Gynecology

## 2021-02-06 DIAGNOSIS — O471 False labor at or after 37 completed weeks of gestation: Secondary | ICD-10-CM

## 2021-02-06 DIAGNOSIS — O479 False labor, unspecified: Secondary | ICD-10-CM

## 2021-02-06 DIAGNOSIS — Z3A39 39 weeks gestation of pregnancy: Secondary | ICD-10-CM | POA: Diagnosis not present

## 2021-02-06 NOTE — Discharge Instructions (Signed)
Rosen's Emergency Medicine: Concepts and Clinical Practice (9th ed., pp. 2296- 2312). Elsevier.">  Braxton Hicks Contractions Contractions of the uterus can occur throughout pregnancy, but they are not always a sign that you are in labor. You may have practice contractions called Braxton Hicks contractions. These false labor contractions are sometimes confused with true labor. What are Braxton Hicks contractions? Braxton Hicks contractions are tightening movements that occur in the muscles of the uterus before labor. Unlike true labor contractions, these contractions do not result in opening (dilation) and thinning of the cervix. Toward the end of pregnancy (32-34 weeks), Braxton Hicks contractions can happen more often and may become stronger. These contractions are sometimes difficult to tell apart from true labor because they can be very uncomfortable. You should not feel embarrassed if you go to the hospital with false labor. Sometimes, the only way to tell if you are in true labor is for your health care provider to look for changes in the cervix. The health care provider will do a physical exam and may monitor your contractions. If you are not in true labor, the exam should show that your cervix is not dilating and your water has not broken. If there are no other health problems associated with your pregnancy, it is completely safe for you to be sent home with false labor. You may continue to have Braxton Hicks contractions until you go into true labor. How to tell the difference between true labor and false labor True labor  Contractions last 30-70 seconds.  Contractions become very regular.  Discomfort is usually felt in the top of the uterus, and it spreads to the lower abdomen and low back.  Contractions do not go away with walking.  Contractions usually become more intense and increase in frequency.  The cervix dilates and gets thinner. False labor  Contractions are usually shorter  and not as strong as true labor contractions.  Contractions are usually irregular.  Contractions are often felt in the front of the lower abdomen and in the groin.  Contractions may go away when you walk around or change positions while lying down.  Contractions get weaker and are shorter-lasting as time goes on.  The cervix usually does not dilate or become thin. Follow these instructions at home:  Take over-the-counter and prescription medicines only as told by your health care provider.  Keep up with your usual exercises and follow other instructions from your health care provider.  Eat and drink lightly if you think you are going into labor.  If Braxton Hicks contractions are making you uncomfortable: ? Change your position from lying down or resting to walking, or change from walking to resting. ? Sit and rest in a tub of warm water. ? Drink enough fluid to keep your urine pale yellow. Dehydration may cause these contractions. ? Do slow and deep breathing several times an hour.  Keep all follow-up prenatal visits as told by your health care provider. This is important.   Contact a health care provider if:  You have a fever.  You have continuous pain in your abdomen. Get help right away if:  Your contractions become stronger, more regular, and closer together.  You have fluid leaking or gushing from your vagina.  You pass blood-tinged mucus (bloody show).  You have bleeding from your vagina.  You have low back pain that you never had before.  You feel your baby's head pushing down and causing pelvic pressure.  Your baby is not moving inside   you as much as it used to. Summary  Contractions that occur before labor are called Braxton Hicks contractions, false labor, or practice contractions.  Braxton Hicks contractions are usually shorter, weaker, farther apart, and less regular than true labor contractions. True labor contractions usually become progressively  stronger and regular, and they become more frequent.  Manage discomfort from Braxton Hicks contractions by changing position, resting in a warm bath, drinking plenty of water, or practicing deep breathing. This information is not intended to replace advice given to you by your health care provider. Make sure you discuss any questions you have with your health care provider. Document Revised: 11/23/2017 Document Reviewed: 04/26/2017 Elsevier Patient Education  2021 Elsevier Inc.   Fetal Movement Counts Patient Name: ________________________________________________ Patient Due Date: ____________________  What is a fetal movement count? A fetal movement count is the number of times that you feel your baby move during a certain amount of time. This may also be called a fetal kick count. A fetal movement count is recommended for every pregnant woman. You may be asked to start counting fetal movements as early as week 28 of your pregnancy. Pay attention to when your baby is most active. You may notice your baby's sleep and wake cycles. You may also notice things that make your baby move more. You should do a fetal movement count:  When your baby is normally most active.  At the same time each day. A good time to count movements is while you are resting, after having something to eat and drink. How do I count fetal movements? 1. Find a quiet, comfortable area. Sit, or lie down on your side. 2. Write down the date, the start time and stop time, and the number of movements that you felt between those two times. Take this information with you to your health care visits. 3. Write down your start time when you feel the first movement. 4. Count kicks, flutters, swishes, rolls, and jabs. You should feel at least 10 movements. 5. You may stop counting after you have felt 10 movements, or if you have been counting for 2 hours. Write down the stop time. 6. If you do not feel 10 movements in 2 hours, contact  your health care provider for further instructions. Your health care provider may want to do additional tests to assess your baby's well-being. Contact a health care provider if:  You feel fewer than 10 movements in 2 hours.  Your baby is not moving like he or she usually does. Date: ____________ Start time: ____________ Stop time: ____________ Movements: ____________ Date: ____________ Start time: ____________ Stop time: ____________ Movements: ____________ Date: ____________ Start time: ____________ Stop time: ____________ Movements: ____________ Date: ____________ Start time: ____________ Stop time: ____________ Movements: ____________ Date: ____________ Start time: ____________ Stop time: ____________ Movements: ____________ Date: ____________ Start time: ____________ Stop time: ____________ Movements: ____________ Date: ____________ Start time: ____________ Stop time: ____________ Movements: ____________ Date: ____________ Start time: ____________ Stop time: ____________ Movements: ____________ Date: ____________ Start time: ____________ Stop time: ____________ Movements: ____________ This information is not intended to replace advice given to you by your health care provider. Make sure you discuss any questions you have with your health care provider. Document Revised: 07/31/2019 Document Reviewed: 07/31/2019 Elsevier Patient Education  2021 Elsevier Inc.  

## 2021-02-06 NOTE — MAU Provider Note (Signed)
S: Ms. Jaclyn Gray is a 40 y.o. G1P0000 at [redacted]w[redacted]d  who presents to MAU today for labor evaluation.     Cervical exam by RN:  Dilation: Closed Effacement (%): Thick Cervical Position: Middle Exam by:: K Torphy, RN  Fetal Monitoring: Baseline: 125 Variability: moderate Accelerations: multiple, reactive strip Decelerations: none Contractions: irregular  MDM Discussed patient with RN. NST reviewed and reactive.  A: SIUP at [redacted]w[redacted]d  False labor  P: Discharge home Labor precautions and kick counts included in AVS Patient to follow-up for IOL on 02/08/21 as previously scheduled. Patient may return to MAU as needed or when in labor   Randa Ngo, MD 02/06/2021 4:24 AM

## 2021-02-06 NOTE — MAU Note (Signed)
Pt reports she went to the BR 1;30 and she noticed some blood in the toilet and when she wiped . Reports some menstrual like cramping on and off as well. Good fetal movement reported. Not dilated on Wed in office visit.

## 2021-02-07 ENCOUNTER — Other Ambulatory Visit

## 2021-02-07 ENCOUNTER — Inpatient Hospital Stay (HOSPITAL_COMMUNITY): Admission: RE | Admit: 2021-02-07 | Source: Ambulatory Visit

## 2021-02-07 ENCOUNTER — Encounter (HOSPITAL_COMMUNITY): Payer: Self-pay | Admitting: Family Medicine

## 2021-02-07 ENCOUNTER — Encounter: Payer: Self-pay | Admitting: *Deleted

## 2021-02-07 ENCOUNTER — Inpatient Hospital Stay (HOSPITAL_COMMUNITY)
Admission: AD | Admit: 2021-02-07 | Discharge: 2021-02-10 | DRG: 807 | Disposition: A | Discharge status: Home / Self Care | Attending: Obstetrics and Gynecology | Admitting: Obstetrics and Gynecology

## 2021-02-07 ENCOUNTER — Ambulatory Visit (INDEPENDENT_AMBULATORY_CARE_PROVIDER_SITE_OTHER): Admitting: Obstetrics & Gynecology

## 2021-02-07 ENCOUNTER — Inpatient Hospital Stay (HOSPITAL_COMMUNITY): Admission: AD | Admit: 2021-02-07 | Source: Home / Self Care | Admitting: Obstetrics & Gynecology

## 2021-02-07 ENCOUNTER — Ambulatory Visit (INDEPENDENT_AMBULATORY_CARE_PROVIDER_SITE_OTHER): Admitting: *Deleted

## 2021-02-07 ENCOUNTER — Other Ambulatory Visit
Admission: RE | Admit: 2021-02-07 | Discharge: 2021-02-07 | Disposition: A | Discharge status: Home / Self Care | Source: Ambulatory Visit | Attending: Family Medicine | Admitting: Family Medicine

## 2021-02-07 ENCOUNTER — Other Ambulatory Visit: Payer: Self-pay

## 2021-02-07 VITALS — BP 129/85 | HR 85 | Wt 183.0 lb

## 2021-02-07 DIAGNOSIS — Z3A39 39 weeks gestation of pregnancy: Secondary | ICD-10-CM

## 2021-02-07 DIAGNOSIS — O09513 Supervision of elderly primigravida, third trimester: Secondary | ICD-10-CM | POA: Diagnosis present

## 2021-02-07 DIAGNOSIS — O43123 Velamentous insertion of umbilical cord, third trimester: Secondary | ICD-10-CM | POA: Diagnosis present

## 2021-02-07 DIAGNOSIS — O3413 Maternal care for benign tumor of corpus uteri, third trimester: Secondary | ICD-10-CM | POA: Diagnosis present

## 2021-02-07 DIAGNOSIS — D259 Leiomyoma of uterus, unspecified: Secondary | ICD-10-CM | POA: Diagnosis present

## 2021-02-07 DIAGNOSIS — Z01812 Encounter for preprocedural laboratory examination: Secondary | ICD-10-CM | POA: Insufficient documentation

## 2021-02-07 DIAGNOSIS — O134 Gestational [pregnancy-induced] hypertension without significant proteinuria, complicating childbirth: Principal | ICD-10-CM | POA: Diagnosis present

## 2021-02-07 DIAGNOSIS — Z20822 Contact with and (suspected) exposure to covid-19: Secondary | ICD-10-CM | POA: Diagnosis present

## 2021-02-07 DIAGNOSIS — O26893 Other specified pregnancy related conditions, third trimester: Secondary | ICD-10-CM | POA: Diagnosis present

## 2021-02-07 DIAGNOSIS — O139 Gestational [pregnancy-induced] hypertension without significant proteinuria, unspecified trimester: Secondary | ICD-10-CM | POA: Diagnosis not present

## 2021-02-07 DIAGNOSIS — O0990 Supervision of high risk pregnancy, unspecified, unspecified trimester: Secondary | ICD-10-CM

## 2021-02-07 DIAGNOSIS — Z3A40 40 weeks gestation of pregnancy: Secondary | ICD-10-CM | POA: Diagnosis not present

## 2021-02-07 LAB — RESP PANEL BY RT-PCR (FLU A&B, COVID) ARPGX2
Influenza A by PCR: NEGATIVE
Influenza B by PCR: NEGATIVE
SARS Coronavirus 2 by RT PCR: NEGATIVE

## 2021-02-07 LAB — CBC
HCT: 38.3 % (ref 36.0–46.0)
Hemoglobin: 12.6 g/dL (ref 12.0–15.0)
MCH: 30.7 pg (ref 26.0–34.0)
MCHC: 32.9 g/dL (ref 30.0–36.0)
MCV: 93.4 fL (ref 80.0–100.0)
Platelets: 253 10*3/uL (ref 150–400)
RBC: 4.1 MIL/uL (ref 3.87–5.11)
RDW: 14.5 % (ref 11.5–15.5)
WBC: 14.7 10*3/uL — ABNORMAL HIGH (ref 4.0–10.5)
nRBC: 0 % (ref 0.0–0.2)

## 2021-02-07 LAB — TYPE AND SCREEN
ABO/RH(D): A POS
Antibody Screen: NEGATIVE

## 2021-02-07 MED ORDER — LACTATED RINGERS IV SOLN: INTRAVENOUS | Status: DC

## 2021-02-07 MED ORDER — FLEET ENEMA 7-19 GM/118ML RE ENEM: 1.0000 | ENEMA | RECTAL | Status: DC | PRN

## 2021-02-07 MED ORDER — OXYTOCIN-SODIUM CHLORIDE 30-0.9 UT/500ML-% IV SOLN: 2.5000 [IU]/h | INTRAVENOUS | Status: DC

## 2021-02-07 MED ORDER — OXYTOCIN BOLUS FROM INFUSION
333.0000 mL | Freq: Once | INTRAVENOUS | Status: AC
  Administered 2021-02-09: 333 mL via INTRAVENOUS

## 2021-02-07 MED ORDER — ONDANSETRON HCL 4 MG/2ML IJ SOLN: 4.0000 mg | Freq: Four times a day (QID) | INTRAMUSCULAR | Status: DC | PRN

## 2021-02-07 MED ORDER — MISOPROSTOL 25 MCG QUARTER TABLET: 25.0000 ug | ORAL_TABLET | ORAL | Status: DC | PRN

## 2021-02-07 MED ORDER — TERBUTALINE SULFATE 1 MG/ML IJ SOLN: 0.2500 mg | Freq: Once | INTRAMUSCULAR | Status: DC | PRN

## 2021-02-07 MED ORDER — LACTATED RINGERS IV SOLN
500.0000 mL | INTRAVENOUS | Status: DC | PRN
  Administered 2021-02-08: 500 mL via INTRAVENOUS

## 2021-02-07 MED ORDER — SOD CITRATE-CITRIC ACID 500-334 MG/5ML PO SOLN: 30.0000 mL | ORAL | Status: DC | PRN

## 2021-02-07 MED ORDER — LIDOCAINE HCL (PF) 1 % IJ SOLN: 30.0000 mL | INTRAMUSCULAR | Status: DC | PRN

## 2021-02-07 MED ORDER — ACETAMINOPHEN 325 MG PO TABS: 650.0000 mg | ORAL_TABLET | ORAL | Status: DC | PRN

## 2021-02-07 NOTE — Progress Notes (Signed)
   PRENATAL VISIT NOTE  Subjective:  Jaclyn Gray is a 40 y.o. G1P0000 at [redacted]w[redacted]d being seen today for foley placement prior to IOL tomorrow.  She is currently monitored for the following issues for this high-risk pregnancy and has Varicose veins of both lower extremities with inflammation; Generalized anxiety disorder; Supervision of high risk pregnancy, antepartum; AMA (advanced maternal age) primigravida 35+; ASCUS with positive high risk HPV cervical on 07/21/2020; Uterine leiomyoma; Stress at work; Marginal insertion of umbilical cord affecting management of mother; and Small Head Circumference on ultrasound on their problem list.  Patient reports contractions since last night with bloody show.   . Vag. Bleeding: Small.   . Denies leaking of fluid.   The following portions of the patient's history were reviewed and updated as appropriate: allergies, current medications, past family history, past medical history, past social history, past surgical history and problem list.   Objective:  BP 129/85  P 85 Wt 183 lb  Fetal Status:        Presentation: Vertex  General:  Alert, oriented and cooperative. Patient is in no acute distress.  Skin: Skin is warm and dry. No rash noted.   Cardiovascular: Normal heart rate noted  Respiratory: Normal respiratory effort, no problems with respiration noted  Abdomen: Soft, gravid, appropriate for gestational age.        Pelvic: Cervical exam performed in the presence of a chaperone Dilation: 4 Effacement (%): 70 Station: -3  Extremities: Normal range of motion.     Mental Status: Normal mood and affect. Normal behavior. Normal judgment and thought content.   Assessment and Plan:  Pregnancy: G1P0000 at [redacted]w[redacted]d 1. Primigravida of advanced maternal age in third trimester 2. [redacted] weeks gestation of pregnancy 3. Supervision of high risk pregnancy, antepartum No need for foley placement given advanced cervical exam. Julianne Handler, CNM (waterbirth provider  today) notified, patient will be on her way there. Reactive NST here today.  Please refer to After Visit Summary for other counseling recommendations.   Return for Postpartum check.  Future Appointments  Date Time Provider [place]  02/08/2021  8:00 AM MC[facility]LD [place] [facility] None  10/12/2021 11:00 AM Luiz Ochoa, NP [facility] None    Verita Schneiders, MD

## 2021-02-07 NOTE — Progress Notes (Signed)
NST prior to foley bulb placement

## 2021-02-07 NOTE — Plan of Care (Signed)

## 2021-02-07 NOTE — H&P (Signed)
Jaclyn Gray is a 40 y.o. female presenting from the office for direct admission due to spontaneous labor. Patient endorses recurrent painful contractions, new onset this afternoon. She denies vaginal bleeding, leaking of fluid, decreased fetal movement, fever, falls, or recent illness.   OB History    Gravida  1   Para  0   Term  0   Preterm  0   AB  0   Living  0     SAB  0   IAB  0   Ectopic  0   Multiple  0   Live Births  0          Nursing Staff Provider  Office Location  [place] Dating  LMP c/w ultrasound  Language   English Anatomy US  Serial scan for marginal cord  Flu Vaccine  09/2020 Genetic Screen  NIPS:Low risks   AFP: neg  First Screen:  Quad:   TDaP vaccine   11/23/2020 Hgb A1C or  GTT Early A1C 5.2 Third trimester Nml 2 hr GTT  COVID vaccine Declined   LAB RESULTS     Blood Type A/Positive/-- (07/28 1504)   Feeding Plan Breast Antibody Negative (07/28 1504)  Contraception none Rubella 15.70 (07/28 1504)  Circumcision NA RPR Non Reactive (07/28 1504)   Pediatrician   HBsAg Negative (07/28 1504)   Support Person Mom Eunice HCVAb Negative  Prenatal Classes no HIV Non Reactive (07/28 1504)     BTL Consent na GBS  NEGATIVE    Pap ASCUS +HRHPV 07/21/2020  [ ]  colpo    Waterbirth  [X]  Class [X]  Consent [x ] CNM visit    Induction  [ ]  Orders Entered [ ] Foley Y/N   Past Medical History:  Diagnosis Date  . At risk for fertility problems 08/02/2019  . Medical history non-contributory    Past Surgical History:  Procedure Laterality Date  . WISDOM TOOTH EXTRACTION     x4 extracted    Family History: family history includes Breast cancer (age of onset: 20) in her mother; Colon cancer in her maternal grandmother; Diabetes in her father; Hypertension in her father and mother; Lung cancer in her maternal grandfather; Prostate cancer in her father; Throat cancer in her maternal grandmother. Social History:  reports that she has never smoked. She has never used  smokeless tobacco. She reports current alcohol use. She reports that she does not use drugs.     Maternal Diabetes: No Genetic Screening: Normal Maternal Ultrasounds/Referrals: Other: Marginal cord insertion, small fetal head size Fetal Ultrasounds or other Referrals:  Referred to Materal Fetal Medicine  Maternal Substance Abuse:  No Significant Maternal Medications:  None Significant Maternal Lab Results:  Group B Strep negative Other Comments:  None  Review of Systems  Gastrointestinal: Positive for abdominal pain.  Musculoskeletal: Positive for back pain.  All other systems reviewed and are negative.  Dilation: 4.5 Station: -3 Exam by:: Maryelizabeth Kaufmann, CNM Blood pressure 127/85, pulse 95, last menstrual period 05/04/2020.   Physical Exam Vitals and nursing note reviewed. Exam conducted with a chaperone present.  Constitutional:      Appearance: Normal appearance.  Cardiovascular:     Rate and Rhythm: Normal rate.     Pulses: Normal pulses.     Heart sounds: Normal heart sounds.  Pulmonary:     Effort: Pulmonary effort is normal.     Breath sounds: Normal breath sounds.  Abdominal:     Tenderness: There is no right CVA tenderness, left CVA tenderness  or guarding.     Comments: Gravid  Skin:    Capillary Refill: Capillary refill takes less than 2 seconds.  Neurological:     Mental Status: She is alert and oriented to person, place, and time.  Psychiatric:        Mood and Affect: Mood normal.        Behavior: Behavior normal.        Thought Content: Thought content normal.        Judgment: Judgment normal.     Prenatal labs: ABO, Rh: A/Positive/-- (07/28 1504) Antibody: Negative (07/28 1504) Rubella: 15.70 (07/28 1504) RPR: Non Reactive (11/30 0930)  HBsAg: Negative (07/28 1504)  HIV: Non Reactive (11/30 0931)  GBS: Negative/-- (01/19 1100)   Assessment/Plan: --40 y.o. G1P0000 at [redacted]w[redacted]d  Labor --Direct admit from office for SOL --Cat I tracing: baseline  125, mod var, + 15 x 15 accels, no decels --Toco: irregular contractions q 2-4 min --GBS neg --4cm in office at 1515 --Now 46.3/01/SWFUXNATFT--Cephalic confirmed via BSUS --Assess for SOL --362w6dAMA, previously advised by MFM to induce at 40 weeks --Consider AROM or Pitocin augmentation if no spontaneous change by midnight  --Encouraged position changes, consider nipple stim to facilitate labor  MFM Surveillance --Serial scans for fibroids and marginal cord insertion --EFW 51% 2825g, AC 90% at 3655w0d--Small fetal head size, does not yet meet criteria for microcephaly  Myomas  Site                     L(cm)      W(cm)      D(cm)       Location  Fundus Left              3.5        2.6        4.1         Intramural  Fundus Mid               3          2.9        3.5         Subserosal  Right                    3.3        2.7        3.9         Intramural  Right                    3.7        3.4        3.8         Subserosal  Right                    4.7        4.2        4.7         Intramural ----------------------------------------------------------------------  Blood Flow                  RI       PI       Comments ----------------------------------------------------------------------   WatDoren CustardPatient desires waterbirth, has met all requirements --Orders placed for intermittent monitoring --May have light labor diet unless indicated otherwise by acuity --Encouraged position changes, reserve tub until active labor  Postpartum Planning --Peds to assess for microcephaly --TDAP 11/23/2020 --Rubella Immune --girl/breast/declines contraception  SamAldona Bar  Chong Sicilian, CNM 02/07/2021, 7:00 PM

## 2021-02-07 NOTE — Progress Notes (Signed)
Report given to Candie Chroman, CNM who assumes care at this time.  Mallie Snooks, MSN, CNM Certified Nurse Midwife, [facility] for [employer], [place] [facility] 02/07/21 8:05 PM

## 2021-02-07 NOTE — Progress Notes (Signed)
Labor Progress Note Jaclyn Gray is a 40 y.o. G1P0000 at [redacted]w[redacted]d presented for latent labor/IOL for AMA (recommended by Dr. Donalee Citrin)  S:  Pt ambulating around unit, resting in bed for cervical exam. MGM and doula (Felicia) at bedside for support. Contractions increasing in frequency/intensity. Initially reported as in her back and wrapping around to the front. After position changes and more ambulation she reported them as in her right hip/thigh and in the front of her pelvis. Tolerated exam well. All questions answered, pt given all options for augmentation and/or expectant management.  O:  BP 136/88 (BP Location: Right Arm)   Pulse 90   Temp 98 F (36.7 C) (Oral)   Resp 20   Ht 5\' 1"  (1.549 m)   Wt 185 lb 4.8 oz (84.1 kg)   LMP 05/04/2020   SpO2 99%   BMI 35.01 kg/m  EFM: baseline 130 bpm/ moderate variability/ present accels/ no decels  Toco/IUPC: q2-41min SVE: Dilation: 4.5 Effacement (%): 70 Station: -2 Presentation: Vertex Exam by:: Gaylan Gerold, CNM  A/P: 40 y.o. G1P0000 [redacted]w[redacted]d  1. Labor: Latent to active, baby descended from last check and beginning to rotate 2. FWB: Cat 1, intermittent monitoring 3. Pain: well controlled with breathing and doula support, still planning waterbirth 4. Offered cytotec with/without IV pain meds for therapeutic rest, also discussed beginning pitocin to titrate and then turn off for potential waterbirth. Pt declined augmentation at this time, wants to wait for next check. Contractions obviously stronger with fetal descent, will continue expectant management. Anticipate SVD.  Gaylan Gerold, CNM, MSN, [place] Certified Nurse Midwife, [facility]

## 2021-02-08 ENCOUNTER — Inpatient Hospital Stay (HOSPITAL_COMMUNITY): Admitting: Anesthesiology

## 2021-02-08 ENCOUNTER — Inpatient Hospital Stay (HOSPITAL_COMMUNITY)

## 2021-02-08 LAB — COMPREHENSIVE METABOLIC PANEL
ALT: 20 U/L (ref 0–44)
AST: 24 U/L (ref 15–41)
Albumin: 3 g/dL — ABNORMAL LOW (ref 3.5–5.0)
Alkaline Phosphatase: 110 U/L (ref 38–126)
Anion gap: 11 (ref 5–15)
BUN: 5 mg/dL — ABNORMAL LOW (ref 6–20)
CO2: 19 mmol/L — ABNORMAL LOW (ref 22–32)
Calcium: 9 mg/dL (ref 8.9–10.3)
Chloride: 104 mmol/L (ref 98–111)
Creatinine, Ser: 0.88 mg/dL (ref 0.44–1.00)
GFR, Estimated: >60 mL/min (ref >60)
Glucose, Bld: 99 mg/dL (ref 70–99)
Potassium: 4 mmol/L (ref 3.5–5.1)
Sodium: 134 mmol/L — ABNORMAL LOW (ref 135–145)
Total Bilirubin: 0.5 mg/dL (ref 0.3–1.2)
Total Protein: 6.5 g/dL (ref 6.5–8.1)

## 2021-02-08 LAB — CBC
HCT: 36.1 % (ref 36.0–46.0)
Hemoglobin: 12 g/dL (ref 12.0–15.0)
MCH: 30.8 pg (ref 26.0–34.0)
MCHC: 33.2 g/dL (ref 30.0–36.0)
MCV: 92.8 fL (ref 80.0–100.0)
Platelets: 248 K/uL (ref 150–400)
RBC: 3.89 MIL/uL (ref 3.87–5.11)
RDW: 14.4 % (ref 11.5–15.5)
WBC: 15.6 K/uL — ABNORMAL HIGH (ref 4.0–10.5)
nRBC: 0 % (ref 0.0–0.2)

## 2021-02-08 LAB — PROTEIN / CREATININE RATIO, URINE
Creatinine, Urine: 31.28 mg/dL
Total Protein, Urine: <6 mg/dL

## 2021-02-08 LAB — SARS CORONAVIRUS 2 (TAT 6-24 HRS): SARS Coronavirus 2: NEGATIVE

## 2021-02-08 LAB — SYPHILIS: RPR W/REFLEX TO RPR TITER AND TREPONEMAL ANTIBODIES, TRADITIONAL SCREENING AND DIAGNOSIS ALGORITHM: RPR Ser Ql: NONREACTIVE

## 2021-02-08 MED ORDER — LACTATED RINGERS IV SOLN
500.0000 mL | Freq: Once | INTRAVENOUS | Status: AC
  Administered 2021-02-08: 500 mL via INTRAVENOUS

## 2021-02-08 MED ORDER — PHENYLEPHRINE 40 MCG/ML (10ML) SYRINGE FOR IV PUSH (FOR BLOOD PRESSURE SUPPORT): 80.0000 ug | PREFILLED_SYRINGE | INTRAVENOUS | Status: DC | PRN

## 2021-02-08 MED ORDER — EPHEDRINE 5 MG/ML INJ: 10.0000 mg | INTRAVENOUS | Status: DC | PRN

## 2021-02-08 MED ORDER — OXYTOCIN-SODIUM CHLORIDE 30-0.9 UT/500ML-% IV SOLN
1.0000 m[IU]/min | INTRAVENOUS | Status: DC
  Administered 2021-02-08: 2 m[IU]/min via INTRAVENOUS
  Filled 2021-02-08: qty 500

## 2021-02-08 MED ORDER — LIDOCAINE HCL (PF) 1 % IJ SOLN
INTRAMUSCULAR | Status: DC | PRN
  Administered 2021-02-08: 10 mL via EPIDURAL

## 2021-02-08 MED ORDER — TERBUTALINE SULFATE 1 MG/ML IJ SOLN: 0.2500 mg | Freq: Once | INTRAMUSCULAR | Status: DC | PRN

## 2021-02-08 MED ORDER — FENTANYL CITRATE (PF) 100 MCG/2ML IJ SOLN
100.0000 ug | Freq: Once | INTRAMUSCULAR | Status: AC
  Administered 2021-02-08: 100 ug via INTRAVENOUS
  Filled 2021-02-08: qty 2

## 2021-02-08 MED ORDER — SODIUM CHLORIDE 0.9 % IV SOLN
25.0000 mg | Freq: Once | INTRAVENOUS | Status: AC
  Administered 2021-02-08: 25 mg via INTRAVENOUS
  Filled 2021-02-08: qty 1

## 2021-02-08 MED ORDER — PHENYLEPHRINE 40 MCG/ML (10ML) SYRINGE FOR IV PUSH (FOR BLOOD PRESSURE SUPPORT)
80.0000 ug | PREFILLED_SYRINGE | INTRAVENOUS | Status: DC | PRN
  Filled 2021-02-08: qty 10

## 2021-02-08 MED ORDER — PROMETHAZINE HCL 25 MG/ML IJ SOLN: 12.5000 mg | Freq: Once | INTRAMUSCULAR | Status: DC

## 2021-02-08 MED ORDER — FENTANYL-BUPIVACAINE-NACL 0.5-0.125-0.9 MG/250ML-% EP SOLN
12.0000 mL/h | EPIDURAL | Status: DC | PRN
  Administered 2021-02-08: 12 mL/h via EPIDURAL
  Filled 2021-02-08: qty 250

## 2021-02-08 MED ORDER — DIPHENHYDRAMINE HCL 50 MG/ML IJ SOLN: 12.5000 mg | INTRAMUSCULAR | Status: DC | PRN

## 2021-02-08 MED ORDER — FENTANYL CITRATE (PF) 100 MCG/2ML IJ SOLN
100.0000 ug | INTRAMUSCULAR | Status: DC | PRN
  Administered 2021-02-08 (×7): 100 ug via INTRAVENOUS
  Filled 2021-02-08 (×7): qty 2

## 2021-02-08 NOTE — Progress Notes (Signed)
Patient ID: Jaclyn Gray, female   DOB: [birth year], 40 y.o.   MRN: [id number]  States that ctx didn't increase in intensity after AROM like she was hoping for  BP 147/76 FHR 120s, +accels, no decels Ctx q 2-6 mins, spont Cx unchanged  IUP@40 .0wks gHTN IOL process  -Pitocin recommended as next step; pt/family/doula in agreement and all questions answered -Hopeful for active labor with vag del  [place] 02/08/2021 4:29 PM

## 2021-02-08 NOTE — Progress Notes (Signed)
Patient ID: Jaclyn Gray, female   DOB: May 31, 1981, 39 y.o.   MRN: [id number]  Feeling a bit more uncomfortable; doesn't want to have an exam yet, but is requesting IV pain meds  BP 144/86, P 81 FHR 125, +accels, no decels, Cat 1 Ctx q 2-3 mins with Pit at 61mu/min Cx deferred  IUP@40 .0wks gHTN IOL process  Given Fentanyl; will reassess prn  Myrtis Ser CNM 02/08/2021 7:45 PM

## 2021-02-08 NOTE — Progress Notes (Signed)
Patient ID: Jaclyn Gray, female   DOB: March 24, 1981, 40 y.o.   MRN: [id number] Doing well, getting another dose of Fentanyl  Vitals:   02/08/21 1822 02/08/21 1823 02/08/21 1913 02/08/21 1945  BP:  (!) 144/86 (!) 148/87 135/89  Pulse:  81 85 72  Resp:  16    Temp: 98.9 F (37.2 C)     TempSrc: Oral     SpO2:      Weight:      Height:       FHR reassuring UCs every 2-3 min  Dilation: 6 Effacement (%): 90 Cervical Position: Posterior Station: 0 Presentation: Vertex Exam by:: Hansel Feinstein, CNM  IUPC inserted to ascertain adequacy of contractions

## 2021-02-08 NOTE — Anesthesia Preprocedure Evaluation (Signed)
Anesthesia Evaluation  Patient identified by MRN, date of birth, ID band Patient awake    Reviewed: Allergy & Precautions, H&P , NPO status , Patient's Chart, lab work & pertinent test results  Airway Mallampati: II       Dental no notable dental hx.    Pulmonary neg pulmonary ROS,    Pulmonary exam normal        Cardiovascular negative cardio ROS Normal cardiovascular exam     Neuro/Psych PSYCHIATRIC DISORDERS Anxiety negative neurological ROS     GI/Hepatic negative GI ROS, Neg liver ROS,   Endo/Other  negative endocrine ROS  Renal/GU negative Renal ROS  negative genitourinary   Musculoskeletal negative musculoskeletal ROS (+)   Abdominal (+) + obese,   Peds  Hematology negative hematology ROS (+)   Anesthesia Other Findings   Reproductive/Obstetrics (+) Pregnancy                             Anesthesia Physical Anesthesia Plan  ASA: II  Anesthesia Plan: Epidural   Post-op Pain Management:    Induction:   PONV Risk Score and Plan:   Airway Management Planned:   Additional Equipment:   Intra-op Plan:   Post-operative Plan:   Informed Consent: I have reviewed the patients History and Physical, chart, labs and discussed the procedure including the risks, benefits and alternatives for the proposed anesthesia with the patient or authorized representative who has indicated his/her understanding and acceptance.       Plan Discussed with:   Anesthesia Plan Comments:         Anesthesia Quick Evaluation

## 2021-02-08 NOTE — Progress Notes (Signed)
Labor Progress Note Jaclyn Gray is a 40 y.o. G1P0000 at [redacted]w[redacted]d presented for latent labor/possible augmentation for AMA (per Donalee Citrin)  S:  Pt requesting IV pain meds and cytotec if unchanged. Sitting in chair, stands with contractions to relieve tailbone pain (which is gone when she stands). Feeling like baby is lower and contractions are much stronger now.  O:  BP 139/84 (BP Location: Right Arm)   Pulse 88   Temp 98.5 F (36.9 C) (Oral)   Resp 18   Ht 5\' 1"  (1.549 m)   Wt 185 lb 4.8 oz (84.1 kg)   LMP 05/04/2020   SpO2 99%   BMI 35.01 kg/m  EFM: baseline 135 bpm/ moderate variability/ + accels/ no decels  Toco/IUPC: q2-3, strong SVE: Dilation: 6 Effacement (%): 80,90 Station: -1 Presentation: Vertex Exam by:: Jaclyn Gray, CNM  A/P: 40 y.o. G1P0000 [redacted]w[redacted]d  1. Labor: active labor 2. FWB: cat 1 3. Pain: fentanyl + phenergan, doula at bedside for support 4. Still planning waterbirth  Continue expectant management. Anticipate SVD.  Gabriel Carina, CNM 1:15 AM

## 2021-02-08 NOTE — Progress Notes (Signed)
Labor Progress Note Jaclyn Gray is a 40 y.o. G1P0000 at [redacted]w[redacted]d presented for SOL (latent phase)   S:  Pt resting in bed, MGM at bedside. Contractions slowed considerably, pt wanting to rest, refusing peanut ball in bed. Lying in semi-fowler's with a pillow under her left side.  O:  BP 140/87   Pulse 85   Temp 98.3 F (36.8 C) (Oral)   Resp 18   Ht 5\' 1"  (1.549 m)   Wt 185 lb 4.8 oz (84.1 kg)   LMP 05/04/2020   SpO2 99%   BMI 35.01 kg/m  EFM: baseline 125 bpm/ moderate variability/ + accels/ no decels  Toco/IUPC: q10+min SVE: Dilation: 6 Effacement (%): 80 Station: -1 Presentation: Vertex Exam by:: Arby Barrette, RN  A/P: 40 y.o. G1P0000 [redacted]w[redacted]d  1. Labor: active to latent (contraction pattern slowed with pt in bed) - pt still refusing labor augmentation. Encouraged pt to continue resting until 0600 but then to get up and be more active with walking, squatting, etc. Pt amenable to plan.  2. FWB: Cat 1, periods of minimal variability but accels present. Will give 567ml bolus if same variability at 0700. 3. Pain: controlled with IV pain meds and deep breathing 4. Monitoring for gHTN, had two elevated pressures - 132/90 (right after a contractions), 140/87 (pt asleep in bed). PEC labs drawn. Discussed with pt effects of hypertensive status on waterbirth, pt verbalized understanding that an additional elevated BP will rule her out of waterbirth.    Anticipate SVD.  Gabriel Carina, CNM 5:30 AM

## 2021-02-08 NOTE — Progress Notes (Signed)
Labor Progress Note Jaclyn Gray is a 40 y.o. G1P0000 at [redacted]w[redacted]d presented for SOL S: Patient is feeling well, walking in room.   O:  BP 134/82   Pulse 93   Temp 98.5 F (36.9 C) (Oral)   Resp 15   Ht 5\' 1"  (1.549 m)   Wt 84.1 kg   LMP 05/04/2020   SpO2 99%   BMI 35.01 kg/m  EFM: baseline HR 135/mod variability/accels present, no decels   CVE: Dilation: 6 Effacement (%): 80 Station: -1 Presentation: Vertex Exam by:: Derrill Memo CNM    A&P: 40 y.o. G1P0000 [redacted]w[redacted]d presented for SOL #Labor: Patient has maintained at 6 cm since admission at about 0100. Patient has refused augmentation. Will continue to manage expectantly for now. Can re visit augmentation and potentially starting Pit at next check.  #Pain: analgesics prn, doula at bedside for support  #FWB: Cat 1 strip #GBS negative #gHTN: 2 elevated BP over 4 hours apart. No severe range pressures. Precludes [place] for [place] 12:08 PM

## 2021-02-08 NOTE — Progress Notes (Addendum)
Patient ID: Jaclyn Gray, female   DOB: 06-06-1981, 40 y.o.   MRN: [id number]  In bed, requesting AROM now; cx post 6/80/vtx -2; AROM for thick MSF; FHR 130s, +accels, Cat 1; will manage expectantly for awhile. Fentanyl 191mcg q 1h prn.  Myrtis Ser [facility] 02/08/2021 12:38 PM

## 2021-02-08 NOTE — Anesthesia Procedure Notes (Signed)
Epidural Patient location during procedure: OB Start time: 02/08/2021 10:21 PM End time: 02/08/2021 10:27 PM  Staffing Anesthesiologist: Lyn Hollingshead, MD Performed: anesthesiologist   Preanesthetic Checklist Completed: patient identified, IV checked, site marked, risks and benefits discussed, surgical consent, monitors and equipment checked, pre-op evaluation and timeout performed  Epidural Patient position: sitting Prep: DuraPrep and site prepped and draped Patient monitoring: continuous pulse ox and blood pressure Approach: midline Location: L3-L4 Injection technique: LOR air  Needle:  Needle type: Tuohy  Needle gauge: 17 G Needle length: 9 cm and 9 Needle insertion depth: 6 cm Catheter type: closed end flexible Catheter size: 19 Gauge Catheter at skin depth: 11 cm Test dose: negative and Other  Assessment Events: blood not aspirated, injection not painful, no injection resistance, no paresthesia and negative IV test

## 2021-02-08 NOTE — Progress Notes (Signed)
Patient was assessed and managed by nursing staff during this encounter. I have reviewed the chart and agree with the documentation and plan. I have also made any necessary editorial changes.  Verita Schneiders, MD 02/08/2021 1:43 PM

## 2021-02-08 NOTE — Progress Notes (Signed)
Patient ID: Anagha Loseke, female   DOB: 11/15/1981, 40 y.o.   MRN: [id number]  Sitting on toilet during ctx; coping well; elected to defer AROM earlier, but is now considering again; dx with gHTN earlier this morning (with BP 146/87@0920 ) and she understands that waterbirth is no longer an option  BP 134/82, 138/81 FHR 130s, +accels, no decels Ctx spont q 3-6 mins Cx deferred currently  P/C: too low to report Pre-e bloodwork: neg  IUP@40 .0wks gHTN- stable, neg labs Protracted active phase  She will make her way to the bed and then will plan on AROM followed by expectant management, and then Pitocin prn 2-4hrs after AROM if no active labor  Myrtis Ser CNM 02/08/2021 12:13 PM

## 2021-02-09 DIAGNOSIS — Z3A40 40 weeks gestation of pregnancy: Secondary | ICD-10-CM

## 2021-02-09 DIAGNOSIS — O134 Gestational [pregnancy-induced] hypertension without significant proteinuria, complicating childbirth: Secondary | ICD-10-CM

## 2021-02-09 MED ORDER — ZOLPIDEM TARTRATE 5 MG PO TABS: 5.0000 mg | ORAL_TABLET | Freq: Every evening | ORAL | Status: DC | PRN

## 2021-02-09 MED ORDER — DIPHENHYDRAMINE HCL 25 MG PO CAPS
25.0000 mg | ORAL_CAPSULE | Freq: Four times a day (QID) | ORAL | Status: DC | PRN
Start: 2021-02-09 — End: 2021-02-10

## 2021-02-09 MED ORDER — PRENATAL MULTIVITAMIN CH
1.0000 | ORAL_TABLET | Freq: Every day | ORAL | Status: DC
  Administered 2021-02-09 – 2021-02-10 (×2): 1 via ORAL
  Filled 2021-02-09 (×2): qty 1

## 2021-02-09 MED ORDER — WITCH HAZEL-GLYCERIN EX PADS: 1.0000 "application " | MEDICATED_PAD | CUTANEOUS | Status: DC | PRN

## 2021-02-09 MED ORDER — SIMETHICONE 80 MG PO CHEW: 80.0000 mg | CHEWABLE_TABLET | ORAL | Status: DC | PRN

## 2021-02-09 MED ORDER — ONDANSETRON HCL 4 MG/2ML IJ SOLN: 4.0000 mg | INTRAMUSCULAR | Status: DC | PRN

## 2021-02-09 MED ORDER — IBUPROFEN 600 MG PO TABS
600.0000 mg | ORAL_TABLET | Freq: Four times a day (QID) | ORAL | Status: DC
  Administered 2021-02-09 – 2021-02-10 (×7): 600 mg via ORAL
  Filled 2021-02-09 (×7): qty 1

## 2021-02-09 MED ORDER — DIBUCAINE (PERIANAL) 1 % EX OINT: 1.0000 "application " | TOPICAL_OINTMENT | CUTANEOUS | Status: DC | PRN

## 2021-02-09 MED ORDER — COCONUT OIL OIL: 1.0000 "application " | TOPICAL_OIL | Status: DC | PRN

## 2021-02-09 MED ORDER — ACETAMINOPHEN 325 MG PO TABS
650.0000 mg | ORAL_TABLET | ORAL | Status: DC
  Administered 2021-02-09 – 2021-02-10 (×9): 650 mg via ORAL
  Filled 2021-02-09 (×10): qty 2

## 2021-02-09 MED ORDER — BENZOCAINE-MENTHOL 20-0.5 % EX AERO
1.0000 "application " | INHALATION_SPRAY | CUTANEOUS | Status: DC | PRN
  Administered 2021-02-09: 1 via TOPICAL
  Filled 2021-02-09: qty 56

## 2021-02-09 MED ORDER — SENNOSIDES-DOCUSATE SODIUM 8.6-50 MG PO TABS
2.0000 | ORAL_TABLET | Freq: Every day | ORAL | Status: DC
  Administered 2021-02-10: 2 via ORAL
  Filled 2021-02-09: qty 2

## 2021-02-09 MED ORDER — TETANUS-DIPHTH-ACELL PERTUSSIS 5-2.5-18.5 LF-MCG/0.5 IM SUSY: 0.5000 mL | PREFILLED_SYRINGE | Freq: Once | INTRAMUSCULAR | Status: DC

## 2021-02-09 MED ORDER — ONDANSETRON HCL 4 MG PO TABS: 4.0000 mg | ORAL_TABLET | ORAL | Status: DC | PRN

## 2021-02-09 NOTE — Progress Notes (Signed)
Patient ID: Jaclyn Gray, female   DOB: 1981/05/21, 40 y.o.   MRN: [id number] Has progressed to 9cm  Turned to side and began having pain just to left of sternum Tender to palpation there Turned her back to supine position with complete resolution of pain Explained this is likely softening of cartilage between sternum and ribs which caused pain with movement and repositioining  FHR stable UCs regular but vary in strength via IUPC  Dilation: 9 Effacement (%): 90 Cervical Position: Posterior Station: Plus 1 Presentation: Vertex Exam by:: MScott, RN  Will increase Pitocin and await second stage

## 2021-02-09 NOTE — Anesthesia Postprocedure Evaluation (Deleted)
Anesthesia Post Note  Patient: Jaclyn Gray  Procedure(s) Performed: AN AD [place]     Patient location during evaluation: Mother Baby Anesthesia Type: Epidural Level of consciousness: awake, awake and alert and oriented Pain management: pain level controlled Vital Signs Assessment: post-procedure vital signs reviewed and stable Respiratory status: spontaneous breathing, nonlabored ventilation and respiratory function stable Cardiovascular status: stable Postop Assessment: no headache, patient able to bend at knees, no apparent nausea or vomiting, no backache, adequate PO intake and able to ambulate Anesthetic complications: no   No complications documented.  Last Vitals:  Vitals:   02/09/21 0612 02/09/21 1013  BP: 125/79 104/60  Pulse: 90 77  Resp: 16 17  Temp: 37 C 36.8 C  SpO2: 100%     Last Pain:  Vitals:   02/09/21 1015  TempSrc:   PainSc: 4    Pain Goal:                   Srihari Shellhammer

## 2021-02-09 NOTE — Progress Notes (Signed)
Patient ID: Jaclyn Gray, female   DOB: Apr 11, 1981, 40 y.o.   MRN: [id number] Progressed to complete dilation and is now pushing  Vitals:   02/09/21 0030 02/09/21 0100 02/09/21 0127 02/09/21 0130  BP: 125/69 124/78  (!) 143/85  Pulse: 77 91  (!) 119  Resp:      Temp:   97.8 F (36.6 C)   TempSrc:   Oral   SpO2:      Weight:      Height:       FHR stable with some early decels UCs q75min  Dilation: 10 Dilation Complete Date: 02/09/21 Dilation Complete Time: 0110 Effacement (%): 90 Cervical Position: Posterior Station: Plus 2 Presentation: Vertex Exam by:: MScott, RN

## 2021-02-09 NOTE — Discharge Summary (Signed)
Postpartum Discharge Summary     Patient Name: Jaclyn Gray DOB: 1981/09/03 MRN: [id number]  Date of admission: 02/07/2021 Delivery date:02/09/2021  Delivering provider: Seabron Spates  Date of discharge: 02/10/2021  Admitting diagnosis: Advanced maternal age, 1st pregnancy, third trimester [O09.513] Intrauterine pregnancy: [redacted]w[redacted]d    Secondary diagnosis:  Active Problems:   Advanced maternal age, 1st pregnancy, third trimester   Gestational hypertension  Additional problems: Thick meconium stained amniotic fluid                                        Protracted Active Stage of Labor                                        Questionable microcephaly based on [place](peds assessment pending)   Discharge diagnosis: Term Pregnancy Delivered, Gestational Hypertension and MSAF                                              Post partum procedures:none Augmentation: AROM and Pitocin Complications: None  Hospital course: Spontaneous Labor With Vaginal Delivery   40y.o. yo G1P0000 at 478w1das admitted to the hospital 02/07/2021 for spontaneous latent labor with diagnosis of Gestational hypertension during her labor course (mild range BP elevations with neg pre-e labs). Patient had an uncomplicated labor course as follows: Membrane Rupture Time/Date: 12:33 PM ,02/08/2021   Delivery Method:Vaginal, Spontaneous  Episiotomy: None  Lacerations:  2nd degree;Perineal  Details of delivery can be found in separate delivery note.  Patient had a routine postpartum course. She remained normotensive without requiring medications. Patient is discharged home 02/10/21 per her request for early d/c as long as the baby can go as well.  Newborn Data: Birth date:02/09/2021  Birth time:2:38 AM  Gender:Female  Living status:Living  Apgars:7 ,9  Weight:2770 g (6lb 1.7oz)  Magnesium Sulfate received: No BMZ received: No Rhophylac:N/A MMR:N/A T-DaP:Given prenatally Flu: Yes Transfusion:No  Physical  exam  Vitals:   02/09/21 1500 02/09/21 1750 02/09/21 2131 02/10/21 0555  BP: 112/70 117/70 119/82 117/81  Pulse: 76 91 90 78  Resp: 17 17 18 17   Temp: 98.2 F (36.8 C) 97.6 F (36.4 C) 98.4 F (36.9 C) 97.7 F (36.5 C)  TempSrc: Oral Oral Oral Oral  SpO2:   99% 100%  Weight:      Height:       General: alert and cooperative Lochia: appropriate Uterine Fundus: firm Incision: N/A DVT Evaluation: No evidence of DVT seen on physical exam. Labs: Lab Results  Component Value Date   WBC 15.6 (H) 02/08/2021   HGB 12.0 02/08/2021   HCT 36.1 02/08/2021   MCV 92.8 02/08/2021   PLT 248 02/08/2021   CMP Latest Ref Rng & Units 02/08/2021  Glucose 70 - 99 mg/dL 99  BUN 6 - 20 mg/dL 5(L)  Creatinine 0.44 - 1.00 mg/dL 0.88  Sodium 135 - 145 mmol/L 134(L)  Potassium 3.5 - 5.1 mmol/L 4.0  Chloride 98 - 111 mmol/L 104  CO2 22 - 32 mmol/L 19(L)  Calcium 8.9 - 10.3 mg/dL 9.0  Total Protein 6.5 - 8.1 g/dL 6.5  Total Bilirubin 0.3 - 1.2 mg/dL 0.5  Alkaline Phos 38 - 126 U/L 110  AST 15 - 41 U/L 24  ALT 0 - 44 U/L 20   Edinburgh Score: Edinburgh Postnatal Depression Scale Screening Tool 02/09/2021  I have been able to laugh and see the funny side of things. 0  I have looked forward with enjoyment to things. 0  I have blamed myself unnecessarily when things went wrong. 0  I have been anxious or worried for no good reason. 0  I have felt scared or panicky for no good reason. 0  Things have been getting on top of me. 0  I have been so unhappy that I have had difficulty sleeping. 0  I have felt sad or miserable. 0  I have been so unhappy that I have been crying. 0  The thought of harming myself has occurred to me. 0  Edinburgh Postnatal Depression Scale Total 0      After visit meds:  Allergies as of 02/10/2021   No Known Allergies     Medication List    STOP taking these medications   Comfort Fit Maternity Supp Sm Misc   cyclobenzaprine 10 MG tablet Commonly known as:  FLEXERIL     TAKE these medications   ibuprofen 600 MG tablet Commonly known as: ADVIL Take 1 tablet (600 mg total) by mouth every 6 (six) hours as needed.   multivitamin-prenatal 27-0.8 MG Tabs tablet Take 1 tablet by mouth daily at 12 noon.        Discharge home in stable condition Infant Feeding: Breast Infant Disposition:home with mother Discharge instruction: per After Visit Summary and Postpartum booklet. Activity: Advance as tolerated. Pelvic rest for 6 weeks.  Diet: routine diet Anticipated Birth Control: none Postpartum Appointment:BP check in 1wk; PP visit in 4wks Additional Postpartum F/U: BP check 1 week Future Appointments: Future Appointments  Date Time Provider [place]  10/12/2021 11:00 AM Luiz Ochoa, NP [facility] None   Follow up Visit:   Myrtis Ser, [place] **I can't see where a message was sent for her PP visit**   Please schedule this patient for Postpartum visit in: BP check in 1 wk/PP visit in 4wks with the following provider: Any provider  BP check can be virtual if pt has a cuff  For C/S patients schedule nurse incision check in weeks 2 weeks: no  High risk pregnancy complicated by: gHTN in labor  Delivery mode: SVD  Anticipated Birth Control: declines  PP Procedures needed: BP check  Schedule Integrated BH visit: no      02/10/2021 Myrtis Ser, CNM  9:04 AM

## 2021-02-09 NOTE — Anesthesia Postprocedure Evaluation (Signed)
Anesthesia Post Note  Patient: Jaclyn Gray  Procedure(s) Performed: AN AD [place]     Patient location during evaluation: Mother Baby Anesthesia Type: Epidural Level of consciousness: awake, awake and alert and oriented Pain management: pain level controlled (RN giving tylenol and ibuprofen for back discomfort. Asked RN to add heating pad for back per patient request.) Vital Signs Assessment: post-procedure vital signs reviewed and stable Respiratory status: spontaneous breathing, nonlabored ventilation and respiratory function stable Cardiovascular status: stable Postop Assessment: no headache, patient able to bend at knees, no apparent nausea or vomiting, adequate PO intake and able to ambulate Anesthetic complications: no   No complications documented.  Last Vitals:  Vitals:   02/09/21 0612 02/09/21 1013  BP: 125/79 104/60  Pulse: 90 77  Resp: 16 17  Temp: 37 C 36.8 C  SpO2: 100%     Last Pain:  Vitals:   02/09/21 1015  TempSrc:   PainSc: 4    Pain Goal:                   Jaclyn Gray

## 2021-02-09 NOTE — Lactation Note (Signed)
This note was copied from a baby's chart. Lactation Consultation Note  Patient Name: Jaclyn Gray [id number] Date: 02/09/2021 Reason for consult: Initial assessment;Mother's request;Difficult latch;Primapara;1st time breastfeeding;Term;Other (Comment) (Gest HTN) Age:39 hours   Mom asked for assistance with latching. Room at 4 and infant sleeping in bassinet. LC lowered temp to 72, removed blanket and placed infant STS. Infant able to latch with signs of milk transfer for 20 minutes at both breasts.   Infant 1 stool and no urine to date. Mom given manual pump to increase stimulation. Pump parts, assembly, cleaning and milk storage reviewed.  Plan 1. To feed based on cues 8-12 xi in 24 hours no more than 4 hours without an attempt.         2. Mom to offer EBM via spoon if unable to get infant latched.          3. Mom to pump manual q 3hrs for 10 minutes         4. I and O sheet reviewed          5. [facility] brochure of inpatient and outpatient services reviewed.  All questions answered at the end of the visit.   Maternal Data Has patient been taught Hand Expression?: Yes Does the patient have breastfeeding experience prior to this delivery?: No  Feeding Mother's Current Feeding Choice: Breast Milk  LATCH Score Latch: Repeated attempts needed to sustain latch, nipple held in mouth throughout feeding, stimulation needed to elicit sucking reflex.  Audible Swallowing: Spontaneous and intermittent  Type of Nipple: Everted at rest and after stimulation  Comfort (Breast/Nipple): Soft / non-tender  Hold (Positioning): Assistance needed to correctly position infant at breast and maintain latch.  LATCH Score: 8   Lactation Tools Discussed/Used Tools: Pump;Flanges Flange Size: 24 Breast pump type: Manual Pump Education: Setup, frequency, and cleaning;Milk Storage Reason for Pumping: Increase stimulation Pumping frequency: every 3 hours for 10  minutes  Interventions Interventions: Breast feeding basics reviewed;Support pillows;Education;Assisted with latch;Position options;Skin to skin;Expressed milk;Breast massage;Hand express;Hand pump;Breast compression;Adjust position  Discharge    Consult Status Consult Status: Follow-up Date: 02/10/21 Follow-up type: In-patient    Velmer Woelfel  N[place]Springer 02/09/2021, 8:28 PM

## 2021-02-09 NOTE — Lactation Note (Addendum)
This note was copied from a baby's chart. Lactation Consultation Note  Patient Name: Jaclyn Gray [id number] Date: 02/09/2021   Age:39 hours ( No charge -LC ) time 3:12 am  L&D Secretary called L&D (RN), per RN  not a good time for [facility] services to come in to see Mom and baby.  Mom was having a repair procedure  done  Due to 2nd degree tare unsure of length of time.  Jaclyn Gray informed Secretary to inform L&D  RN, that  [facility]BU  Publishing copy) will see mom and [facility] services will follow up with mom in morning, LC is ending shift at 3:30 am.   Maternal Data    Feeding    LATCH Score                    Lactation Tools Discussed/Used    Interventions    Discharge    Consult Status      Jaclyn Gray 02/09/2021, 3:05 AM

## 2021-02-10 ENCOUNTER — Encounter (HOSPITAL_COMMUNITY): Payer: Self-pay | Admitting: Family Medicine

## 2021-02-10 DIAGNOSIS — O139 Gestational [pregnancy-induced] hypertension without significant proteinuria, unspecified trimester: Secondary | ICD-10-CM

## 2021-02-10 HISTORY — DX: Gestational (pregnancy-induced) hypertension without significant proteinuria, unspecified trimester: O13.9

## 2021-02-10 MED ORDER — IBUPROFEN 600 MG PO TABS: 600.0000 mg | ORAL_TABLET | Freq: Four times a day (QID) | ORAL | 0 refills | Status: DC | PRN

## 2021-02-10 NOTE — Social Work (Signed)
CSW received consult for hx of Anxiety.  CSW met with MOB to offer support and complete assessment.     CSW introduced self and role. CSW observed MOB feeding infant. MOB was appropriate, pleasant and engaged during assessment. CSW informed MOB of reason for consult. MOB was understanding and reported she experienced work related anxiety starting November of 2019. MOB stated she managed by seeing a therapist with [employer] and filing claims against the environment. MOB has never been prescribed medication to address the anxiety. MOB reported the anxiety was not related to the pregnancy and she had a good pregnancy. MOB expressed she is currently doing well and denies any SI, HI or being involved in DV. MOB shared she has a strong support system which consists of her mother, aunts and brother.   CSW provided education regarding the baby blues period versus perinatal mood disorders and discussed treatment if concerns arise.  CSW recommends self-evaluation during the postpartum time period using the New Mom Checklist and encouraged MOB to contact a medical professional if symptoms are noted at any time.   CSW provided review of Sudden Infant Death Syndrome (SIDS) precautions.  MOB reported she has all essential needs for infant. MOB identified Dr. Armandina Gemma of [facility] for follow-up care and denies any barriers. MOB expressed no additional needs at this time.  CSW identifies no further need for intervention and no barriers to discharge at this time.  Darra Lis, [place] [employer] [phone]

## 2021-02-10 NOTE — Lactation Note (Signed)
This note was copied from a baby's chart. Lactation Consultation Note  Patient Name: Jaclyn Gray [id number] Date: 02/10/2021 Reason for consult: Follow-up assessment Age:39 hours   Mother reports that she just finished a 20 min and a 5 min feeding. Infant is still cuing and sucking on her fist.   Mother reports that she doesn't know  How to do cross cradle hold.  Assist mother with football and cross cradle hold while sitting up in the chair. Infant suckled a few sucks and then just held the nipple in her mouth. Mother taught breast compression.   Assist mother with hand expression.  Infant was fed 5 ml of ebm with a spoon.  Infant placed back to breast but showed no interest in breastfeeding.  good feeding.   Discussed supplement of DBM if unable to pump and get volume . Mother interested. Infant seems to tire out at the breast and I didn't see a good feeding. Advised mother to page staff nurse of [name] the next feeding.   Maternal Data    Feeding Mother's Current Feeding Choice: Breast Milk  LATCH Score Latch: Repeated attempts needed to sustain latch, nipple held in mouth throughout feeding, stimulation needed to elicit sucking reflex. (infant held nipple in mouth no sucking)  Audible Swallowing: None  Type of Nipple: Everted at rest and after stimulation  Comfort (Breast/Nipple): Soft / non-tender  Hold (Positioning): Assistance needed to correctly position infant at breast and maintain latch.  LATCH Score: 6   Lactation Tools Discussed/Used Breast pump type: Double-Electric Breast Pump Pump Education: Setup, frequency, and cleaning;Milk Storage (encouraged mother to pump q 3 hours) Reason for Pumping: supplementation  Interventions    Discharge Pump: DEBP  Consult Status Consult Status: Follow-up Date: 02/11/21 Follow-up type: In-patient    Jaclyn Gray [facility] 02/10/2021, 1:34 PM

## 2021-02-10 NOTE — Discharge Instructions (Signed)

## 2021-02-11 ENCOUNTER — Ambulatory Visit: Payer: Self-pay

## 2021-02-11 NOTE — Lactation Note (Signed)
This note was copied from a baby's chart. Lactation Consultation Note  Patient Name: Jaclyn Gray [id number] Date: 02/11/2021 Reason for consult: Follow-up assessment Age:39 hours   P1 mother whose infant is now 19 hours old.  This is a term baby at 40+1 weeks with an 8% weight loss.  Mother had baby latched when I arrived.  She was clothed and swaddled.  Suggested mother always feed STS until baby and mother are feeding well together.  Mother removed blanket.  Due to an 8% weight loss I reviewed basic breast feeding techniques with mother.  Observed baby latched correctly, however, demonstrated how baby could be latched more deeply onto the breast.  Mother verbalized understanding.  Mother notes swallows and denies pain with feeding.  Engorgement prevention/treatment reviewed.  Demonstrated manual pump for mother.  She has a DEBP for home use.  Baby has a discharge order and mother is awaiting her ride.  RN updated.   Maternal Data    Feeding    LATCH Score Latch: Grasps breast easily, tongue down, lips flanged, rhythmical sucking.  Audible Swallowing: Spontaneous and intermittent  Type of Nipple: Everted at rest and after stimulation  Comfort (Breast/Nipple): Soft / non-tender  Hold (Positioning): Assistance needed to correctly position infant at breast and maintain latch.  LATCH Score: 9   Lactation Tools Discussed/Used    Interventions Interventions: Breast feeding basics reviewed;Assisted with latch;Skin to skin;Breast compression;Adjust position;Position options;Support pillows;Education  Discharge Discharge Education: Engorgement and breast care  Consult Status Consult Status: Complete Date: 02/11/21 Follow-up type: In-patient    Amalia Edgecombe R Bibiana Gillean 02/11/2021, 10:36 AM

## 2021-02-11 NOTE — Lactation Note (Signed)
This note was copied from a baby's chart. Lactation Consultation Note Baby 44 hrs old. F/u w/mom to see how BF going. Mom is tired. Baby is cluster feeding wanting to BF every hour. Explained that is all normal behavior of a newborn. Mom has supplemented w/Donor milk because she felt that she wasn't giving her enough. Not getting anything pumping and not having the time to pump d/t excessive BF. Baby fell asleep at the breast then when swaddled placed in bassinet she woke up cueing. Mom has no support person with her to help hold baby tonight.  Praised mom for all her hard work. Encouraged breast massage at intervals during feedings.  Patient Name: Jaclyn Gray [id number] Date: 02/11/2021 Reason for consult: Follow-up assessment;Primapara;Term Age:51 hours  Maternal Data Has patient been taught Hand Expression?: Yes Does the patient have breastfeeding experience prior to this delivery?: No  Feeding Mother's Current Feeding Choice: Breast Milk and Donor Milk  LATCH Score Latch: Grasps breast easily, tongue down, lips flanged, rhythmical sucking.  Audible Swallowing: A few with stimulation  Type of Nipple: Everted at rest and after stimulation  Comfort (Breast/Nipple): Soft / non-tender  Hold (Positioning): Assistance needed to correctly position infant at breast and maintain latch.  LATCH Score: 8   Lactation Tools Discussed/Used    Interventions Interventions: Breast feeding basics reviewed;Adjust position;Assisted with latch;Support pillows;Skin to skin;Position options;Breast massage;Breast compression;Hand express  Discharge    Consult Status Consult Status: Follow-up Date: 02/11/21 Follow-up type: In-patient    Theodoro Kalata 02/11/2021, 2:34 AM

## 2021-02-14 ENCOUNTER — Encounter (HOSPITAL_COMMUNITY): Payer: Self-pay | Admitting: Obstetrics & Gynecology

## 2021-02-14 ENCOUNTER — Inpatient Hospital Stay (HOSPITAL_COMMUNITY)
Admission: AD | Admit: 2021-02-14 | Discharge: 2021-02-15 | Disposition: A | Discharge status: Home / Self Care | Attending: Obstetrics & Gynecology | Admitting: Obstetrics & Gynecology

## 2021-02-14 DIAGNOSIS — O135 Gestational [pregnancy-induced] hypertension without significant proteinuria, complicating the puerperium: Secondary | ICD-10-CM | POA: Insufficient documentation

## 2021-02-14 LAB — URINALYSIS, ROUTINE W REFLEX MICROSCOPIC
Bacteria, UA: NONE SEEN
Bilirubin Urine: NEGATIVE
Glucose, UA: NEGATIVE mg/dL
Ketones, ur: NEGATIVE mg/dL
Nitrite: NEGATIVE
Protein, ur: NEGATIVE mg/dL
Specific Gravity, Urine: 1.012 (ref 1.005–1.030)
pH: 6 (ref 5.0–8.0)

## 2021-02-14 NOTE — MAU Note (Signed)
Patient requested time to breastfeed prior to evaluation.  Is currently breastfeeding and will use call light to let nurse know when she is finished.

## 2021-02-14 NOTE — MAU Note (Signed)
Patient reports to MAU stating that her blood pressure at home was 160/90.  States she has a headache, blurry vision and swelling in her feet.  Is postpartum after delivering on 02/09/21 and is currently breastfeeding.

## 2021-02-15 DIAGNOSIS — O139 Gestational [pregnancy-induced] hypertension without significant proteinuria, unspecified trimester: Secondary | ICD-10-CM

## 2021-02-15 DIAGNOSIS — R030 Elevated blood-pressure reading, without diagnosis of hypertension: Secondary | ICD-10-CM | POA: Diagnosis present

## 2021-02-15 DIAGNOSIS — O135 Gestational [pregnancy-induced] hypertension without significant proteinuria, complicating the puerperium: Secondary | ICD-10-CM

## 2021-02-15 LAB — COMPREHENSIVE METABOLIC PANEL
ALT: 45 U/L — ABNORMAL HIGH (ref 0–44)
AST: 28 U/L (ref 15–41)
Albumin: 2.7 g/dL — ABNORMAL LOW (ref 3.5–5.0)
Alkaline Phosphatase: 74 U/L (ref 38–126)
Anion gap: 8 (ref 5–15)
BUN: 7 mg/dL (ref 6–20)
CO2: 24 mmol/L (ref 22–32)
Calcium: 8.6 mg/dL — ABNORMAL LOW (ref 8.9–10.3)
Chloride: 107 mmol/L (ref 98–111)
Creatinine, Ser: 0.74 mg/dL (ref 0.44–1.00)
GFR, Estimated: >60 mL/min (ref >60)
Glucose, Bld: 87 mg/dL (ref 70–99)
Potassium: 3.6 mmol/L (ref 3.5–5.1)
Sodium: 139 mmol/L (ref 135–145)
Total Bilirubin: 0.5 mg/dL (ref 0.3–1.2)
Total Protein: 5.9 g/dL — ABNORMAL LOW (ref 6.5–8.1)

## 2021-02-15 LAB — CBC
HCT: 31.5 % — ABNORMAL LOW (ref 36.0–46.0)
Hemoglobin: 10.6 g/dL — ABNORMAL LOW (ref 12.0–15.0)
MCH: 31.3 pg (ref 26.0–34.0)
MCHC: 33.7 g/dL (ref 30.0–36.0)
MCV: 92.9 fL (ref 80.0–100.0)
Platelets: 322 10*3/uL (ref 150–400)
RBC: 3.39 MIL/uL — ABNORMAL LOW (ref 3.87–5.11)
RDW: 14.2 % (ref 11.5–15.5)
WBC: 8.5 10*3/uL (ref 4.0–10.5)
nRBC: 0 % (ref 0.0–0.2)

## 2021-02-15 MED ORDER — NIFEDIPINE ER OSMOTIC RELEASE 30 MG PO TB24: 30.0000 mg | ORAL_TABLET | Freq: Every day | ORAL | Status: DC

## 2021-02-15 MED ORDER — OXYCODONE HCL 5 MG PO TABS
5.0000 mg | ORAL_TABLET | Freq: Once | ORAL | Status: DC
  Filled 2021-02-15: qty 1

## 2021-02-15 MED ORDER — NIFEDIPINE ER OSMOTIC RELEASE 30 MG PO TB24
30.0000 mg | ORAL_TABLET | Freq: Every day | ORAL | Status: DC
  Administered 2021-02-15: 30 mg via ORAL
  Filled 2021-02-15: qty 1

## 2021-02-15 MED ORDER — LABETALOL HCL 5 MG/ML IV SOLN
40.0000 mg | INTRAVENOUS | Status: DC | PRN
  Filled 2021-02-15: qty 8

## 2021-02-15 MED ORDER — HYDRALAZINE HCL 20 MG/ML IJ SOLN: 10.0000 mg | INTRAMUSCULAR | Status: DC | PRN

## 2021-02-15 MED ORDER — LABETALOL HCL 5 MG/ML IV SOLN
20.0000 mg | INTRAVENOUS | Status: DC | PRN
  Administered 2021-02-15: 20 mg via INTRAVENOUS
  Filled 2021-02-15: qty 4

## 2021-02-15 MED ORDER — NIFEDIPINE ER 30 MG PO TB24: 30.0000 mg | ORAL_TABLET | Freq: Every day | ORAL | 1 refills | Status: DC

## 2021-02-15 MED ORDER — LABETALOL HCL 5 MG/ML IV SOLN: 80.0000 mg | INTRAVENOUS | Status: DC | PRN

## 2021-02-15 NOTE — Discharge Instructions (Signed)
Hypertension During Pregnancy Hypertension is also called high blood pressure. High blood pressure means that the force of the blood moving in your body is high enough to cause problems for you and your baby. Different types of high blood pressure can happen during pregnancy. The types are:  High blood pressure before you got pregnant. This is called chronic hypertension.  This can continue during your pregnancy. Your doctor will want to keep checking your blood pressure. You may need medicine to control your blood pressure while you are pregnant. You will need follow-up visits after you have your baby.  High blood pressure that goes up during pregnancy when it was normal before. This is called gestational hypertension. It will often get better after you have your baby, but your doctor will need to watch your blood pressure to make sure that it is getting better.  You may develop high blood pressure after giving birth. This is called postpartum hypertension. This often occurs within 48 hours after childbirth but may occur up to 6 weeks after giving birth. Very high blood pressure during pregnancy is an emergency that needs treatment right away. How does this affect me? If you have high blood pressure during pregnancy, you have a higher chance of developing high blood pressure:  As you get older.  If you get pregnant again. In some cases, high blood pressure during pregnancy can cause:  Stroke.  Heart attack.  Damage to the kidneys, lungs, or liver.  Preeclampsia.  HELLP syndrome.  Seizures.  Problems with the placenta. How does this affect my baby? Your baby may:  Be born early.  Not weigh as much as he or she should.  Not handle labor well, leading to a C-section. This condition may also result in a baby's death before birth (stillbirth). What are the risks?  Having high blood pressure during a past pregnancy.  Being overweight.  Being age 35 or older.  Being pregnant  for the first time.  Being pregnant with more than one baby.  Becoming pregnant using fertility methods, such as IVF.  Having other problems, such as diabetes or kidney disease. What can I do to lower my risk?  Keep a healthy weight.  Eat a healthy diet.  Follow what your doctor tells you about treating any medical problems that you had before you got pregnant. It is very important to go to all of your doctor visits. Your doctor will check your blood pressure and make sure that your pregnancy is progressing as it should. Treatment should start early if a problem is found.   How is this treated? Treatment for high blood pressure during pregnancy can vary. It depends on the type of high blood pressure you have and how serious it is.  If you were taking medicine for your blood pressure before you got pregnant, talk with your doctor. You may need to change the medicine during pregnancy if it is not safe for your baby.  If your blood pressure goes up during pregnancy, your doctor may order medicine to treat this.  If you are at risk for preeclampsia, your doctor may tell you to take a low-dose aspirin while you are pregnant.  If you have very high blood pressure, you may need to stay in the hospital so you and your baby can be watched closely. You may also need to take medicine to lower your blood pressure.  In some cases, if your condition gets worse, you may need to have your baby early.   Follow these instructions at home: Eating and drinking  Drink enough fluid to keep your pee (urine) pale yellow.  Avoid caffeine.   Lifestyle  Do not smoke or use any products that contain nicotine or tobacco. If you need help quitting, ask your doctor.  Do not use alcohol or drugs.  Avoid stress.  Rest and get plenty of sleep.  Regular exercise can help. Ask your doctor what kinds of exercise are best for you. General instructions  Take over-the-counter and prescription medicines only as  told by your doctor.  Keep all prenatal and follow-up visits. Contact a doctor if:  You have symptoms that your doctor told you to watch for, such as: ? Headaches. ? A feeling like you may vomit (nausea). ? Vomiting. ? Belly (abdominal) pain. ? Feeling dizzy or light-headed. Get help right away if:  You have symptoms of serious problems, such as: ? Very bad belly pain that does not get better with treatment. ? A very bad headache that does not get better. ? Blurry vision. ? Double vision. ? Vomiting that does not get better. ? Sudden, fast weight gain. ? Sudden swelling in your hands, ankles, or face. ? Bleeding from your vagina. ? Blood in your pee. ? Shortness of breath. ? Chest pain. ? Weakness on one side of your body. ? Trouble talking.  Your baby is not moving as much as usual. These symptoms may be an emergency. Get help right away. Call your local emergency services (911 in the [place]  Do not wait to see if the symptoms will go away.  Do not drive yourself to the hospital. Summary  High blood pressure is also called hypertension.  High blood pressure means that the force of the blood moving in your body is high enough to cause problems for you and your baby.  Get help right away if you have symptoms of serious problems due to high blood pressure.  Keep all prenatal and follow-up visits. This information is not intended to replace advice given to you by your health care provider. Make sure you discuss any questions you have with your health care provider. Document Revised: 09/02/2020 Document Reviewed: 09/02/2020 Elsevier Patient Education  2021 Elsevier Inc.  

## 2021-02-15 NOTE — MAU Provider Note (Signed)
History     CSN: [id number]  Arrival date and time: 02/14/21 2255   Event Date/Time   First Provider Initiated Contact with Patient 02/14/21 2350      Chief Complaint  Patient presents with  . Hypertension   40 y.o. G1P1 s/p SVD 6 days ago presenting with high BP and HA. Reports onset this evening. BP at home was 160/90. HA is temporal. Rates pain 4/10. She took Motrin but it didn't help. Reports intermittent blurry vision. Denies CP, SOB, and RUQ pain. Her pregnancy was complicated by AMA and gHTN.    OB History    Gravida  1   Para  1   Term  1   Preterm  0   AB  0   Living  0     SAB  0   IAB  0   Ectopic  0   Multiple  0   Live Births  0           Past Medical History:  Diagnosis Date  . At risk for fertility problems 08/02/2019  . Medical history non-contributory     Past Surgical History:  Procedure Laterality Date  . WISDOM TOOTH EXTRACTION     x4 extracted     Family History  Problem Relation Age of Onset  . Hypertension Mother   . Breast cancer Mother 15  . Prostate cancer Father   . Hypertension Father   . Diabetes Father   . Throat cancer Maternal Grandmother   . Colon cancer Maternal Grandmother   . Lung cancer Maternal Grandfather     Social History   Tobacco Use  . Smoking status: Never Smoker  . Smokeless tobacco: Never Used  Vaping Use  . Vaping Use: Never used  Substance Use Topics  . Alcohol use: Yes    Comment: social   . Drug use: Never    Allergies: No Known Allergies  Medications Prior to Admission  Medication Sig Dispense Refill Last Dose  . ibuprofen (ADVIL) 600 MG tablet Take 1 tablet (600 mg total) by mouth every 6 (six) hours as needed. 30 tablet 0 02/14/2021 at 1700  . Prenatal Vit-Fe Fumarate-FA (MULTIVITAMIN-PRENATAL) 27-0.8 MG TABS tablet Take 1 tablet by mouth daily at 12 noon. 30 tablet 6 02/14/2021 at Unknown time    Review of Systems  Eyes: Positive for visual disturbance.  Respiratory:  Negative for shortness of breath.   Cardiovascular: Negative for chest pain.  Gastrointestinal: Negative for abdominal pain.  Neurological: Positive for headaches.   Physical Exam   Blood pressure 138/76, pulse 62, temperature 98.3 F (36.8 C), temperature source Oral, resp. rate 18, SpO2 100 %, unknown if currently breastfeeding. Patient Vitals for the past 24 hrs:  BP Temp Temp src Pulse Resp SpO2  02/15/21 0130 138/76 -- -- 62 -- --  02/15/21 0100 140/86 -- -- 67 -- --  02/15/21 0045 137/76 -- -- 64 -- --  02/15/21 0032 (!) 149/81 -- -- 69 -- --  02/15/21 0015 (!) 165/80 -- -- 74 -- 100 %  02/15/21 0010 -- -- -- -- -- 100 %  02/15/21 0005 -- -- -- -- -- 100 %  02/15/21 0001 (!) 152/87 -- -- 68 -- --  02/15/21 0000 -- -- -- -- -- 100 %  02/14/21 2355 -- -- -- -- -- 100 %  02/14/21 2350 -- -- -- -- -- 100 %  02/14/21 2345 -- -- -- -- -- 100 %  02/14/21 2343 (!) 165/97  98.3 F (36.8 C) Oral 80 18 100 %    Physical Exam Constitutional:      Appearance: Normal appearance.  HENT:     Head: Normocephalic and atraumatic.  Cardiovascular:     Rate and Rhythm: Normal rate.  Pulmonary:     Effort: Pulmonary effort is normal. No respiratory distress.  Musculoskeletal:        General: Swelling (1+ BLE) present.     Cervical back: Normal range of motion.  Skin:    General: Skin is warm.  Neurological:     General: No focal deficit present.     Mental Status: She is alert and oriented to person, place, and time.  Psychiatric:        Mood and Affect: Mood normal.        Behavior: Behavior normal.    Results for orders placed or performed during the hospital encounter of 02/14/21 (from the past 24 hour(s))  Urinalysis, Routine w reflex microscopic Urine, Clean Catch     Status: Abnormal   Collection Time: 02/14/21 11:46 PM  Result Value Ref Range   Color, Urine YELLOW YELLOW   APPearance CLEAR CLEAR   Specific Gravity, Urine 1.012 1.005 - 1.030   pH 6.0 5.0 - 8.0   Glucose,  UA NEGATIVE NEGATIVE mg/dL   Hgb urine dipstick SMALL (A) NEGATIVE   Bilirubin Urine NEGATIVE NEGATIVE   Ketones, ur NEGATIVE NEGATIVE mg/dL   Protein, ur NEGATIVE NEGATIVE mg/dL   Nitrite NEGATIVE NEGATIVE   Leukocytes,Ua LARGE (A) NEGATIVE   RBC / HPF 11-20 0 - 5 RBC/hpf   WBC, UA 11-20 0 - 5 WBC/hpf   Bacteria, UA NONE SEEN NONE SEEN   Squamous Epithelial / LPF 0-5 0 - 5   Mucus PRESENT   CBC     Status: Abnormal   Collection Time: 02/15/21 12:05 AM  Result Value Ref Range   WBC 8.5 4.0 - 10.5 K/uL   RBC 3.39 (L) 3.87 - 5.11 MIL/uL   Hemoglobin 10.6 (L) 12.0 - 15.0 g/dL   HCT 31.5 (L) 36.0 - 46.0 %   MCV 92.9 80.0 - 100.0 fL   MCH 31.3 26.0 - 34.0 pg   MCHC 33.7 30.0 - 36.0 g/dL   RDW 14.2 11.5 - 15.5 %   Platelets 322 150 - 400 K/uL   nRBC 0.0 0.0 - 0.2 %  Comprehensive metabolic panel     Status: Abnormal   Collection Time: 02/15/21 12:05 AM  Result Value Ref Range   Sodium 139 135 - 145 mmol/L   Potassium 3.6 3.5 - 5.1 mmol/L   Chloride 107 98 - 111 mmol/L   CO2 24 22 - 32 mmol/L   Glucose, Bld 87 70 - 99 mg/dL   BUN 7 6 - 20 mg/dL   Creatinine, Ser 0.74 0.44 - 1.00 mg/dL   Calcium 8.6 (L) 8.9 - 10.3 mg/dL   Total Protein 5.9 (L) 6.5 - 8.1 g/dL   Albumin 2.7 (L) 3.5 - 5.0 g/dL   AST 28 15 - 41 U/L   ALT 45 (H) 0 - 44 U/L   Alkaline Phosphatase 74 38 - 126 U/L   Total Bilirubin 0.5 0.3 - 1.2 mg/dL   GFR, Estimated >60 >60 mL/min   Anion gap 8 5 - 15   MAU Course  Procedures Labetalol 20 mg IV Procardia 30 mg XL  MDM Pt found to have newborn under her gown and blanket. Pt instructed to keep newborns face uncovered at  all times to prevent suffocation and I assisted her with compliance. Instructed not to cover newborns face while at home either and she verbalized understanding. Labs ordered and reviewed. Labetalol 20 mg IV x1 dose given with good response. Labs normal. Declined meds for HA. Discussed presentation and clinical findings wit Dr. Elonda Husky. Plan for po  Procardia and BP check in 2 days. Stable for discharge home.   Assessment and Plan   1. Gestational hypertension without significant proteinuria, postpartum    Discharge home Follow up at [place] in 2 days Strict return precautions Rx Procardia 30 XL  Allergies as of 02/15/2021   No Known Allergies     Medication List    TAKE these medications   ibuprofen 600 MG tablet Commonly known as: ADVIL Take 1 tablet (600 mg total) by mouth every 6 (six) hours as needed.   multivitamin-prenatal 27-0.8 MG Tabs tablet Take 1 tablet by mouth daily at 12 noon.   NIFEdipine 30 MG 24 hr tablet Commonly known as: ADALAT CC Take 1 tablet (30 mg total) by mouth daily.      Julianne Handler, CNM 02/15/2021, 1:31 AM

## 2021-02-17 ENCOUNTER — Ambulatory Visit (INDEPENDENT_AMBULATORY_CARE_PROVIDER_SITE_OTHER): Admitting: *Deleted

## 2021-02-17 ENCOUNTER — Other Ambulatory Visit: Payer: Self-pay

## 2021-02-17 VITALS — BP 143/90 | HR 76

## 2021-02-17 DIAGNOSIS — O135 Gestational [pregnancy-induced] hypertension without significant proteinuria, complicating the puerperium: Secondary | ICD-10-CM

## 2021-02-17 MED ORDER — ENALAPRIL MALEATE 5 MG PO TABS: 10.0000 mg | ORAL_TABLET | Freq: Every day | ORAL | 1 refills | Status: DC

## 2021-02-17 NOTE — Progress Notes (Signed)
Patient was assessed and managed by nursing staff during this encounter. I have reviewed the chart and agree with the documentation and plan. I have also made any necessary editorial changes.  Verita Schneiders, MD 02/17/2021 11:12 AM

## 2021-02-17 NOTE — Progress Notes (Signed)
Subjective:  Jaclyn Gray is a 40 y.o. female here for BP check.   Hypertension ROS: not taking medications regularly as instructed, no chest pain on exertion, no dyspnea on exertion and no swelling of ankles.    Objective:  BP (!) 143/90   Pulse 76   Appearance alert, well appearing, and in no distress. General exam BP noted to be needing improvement today in office.    Assessment:   Blood Pressure needs improvement.   Plan:  Blood pressure shown to Dr Harolyn Rutherford, pt needs to pick up procardia from pharmacy. Pt has been hesitant to take meds due tp pharmacist telling her the manufacturer  does not recommend it with breastfeeding. Informed and ressured patient that is is safe in pregancy and with breastfeeding and that we would not prescribe anything that wasn't safe for her or baby. Pt said she will get med and will come back in Monday to recheck BP. Marland Kitchen

## 2021-02-21 ENCOUNTER — Other Ambulatory Visit: Payer: Self-pay

## 2021-02-21 ENCOUNTER — Ambulatory Visit (INDEPENDENT_AMBULATORY_CARE_PROVIDER_SITE_OTHER): Admitting: *Deleted

## 2021-02-21 VITALS — BP 128/86 | HR 86

## 2021-02-21 DIAGNOSIS — O135 Gestational [pregnancy-induced] hypertension without significant proteinuria, complicating the puerperium: Secondary | ICD-10-CM

## 2021-02-21 NOTE — Progress Notes (Signed)
Patient was assessed and managed by nursing staff during this encounter. I have reviewed the chart and agree with the documentation and plan. I have also made any necessary editorial changes.  Verita Schneiders, MD 02/21/2021 3:05 PM

## 2021-02-21 NOTE — Progress Notes (Signed)
Subjective:  Jaclyn Gray is a 40 y.o. female here for BP check. Pt is taking the enalapril instead of the procardia.   Hypertension ROS: taking medications as instructed, no medication side effects noted, no TIA's, no chest pain on exertion, no dyspnea on exertion and no swelling of ankles.    Objective:  BP 128/86   Pulse 86   Appearance alert, well appearing, and in no distress. General exam BP noted to be well controlled today in office.    Assessment:   Blood Pressure well controlled.   Plan:  Follow up as needed and or at postpartum visit. Marland Kitchen

## 2021-03-04 NOTE — Progress Notes (Signed)
I have reviewed all the lab results. There are some abnormalities that are not critical to the patient's health, but I would like to discuss these in person at an office appointment. Please ask her to schedule a follow up visit with me at her convenience.

## 2021-03-07 NOTE — Progress Notes (Signed)
Please see message. °

## 2021-03-10 ENCOUNTER — Ambulatory Visit (INDEPENDENT_AMBULATORY_CARE_PROVIDER_SITE_OTHER): Admitting: Obstetrics & Gynecology

## 2021-03-10 ENCOUNTER — Other Ambulatory Visit: Payer: Self-pay

## 2021-03-10 DIAGNOSIS — O135 Gestational [pregnancy-induced] hypertension without significant proteinuria, complicating the puerperium: Secondary | ICD-10-CM

## 2021-03-10 NOTE — Progress Notes (Signed)
[facility] Partum Visit Note  Jaclyn Gray is a 40 y.o. G60P1001 female who presents for a postpartum visit. She is 4 weeks postpartum following a normal spontaneous vaginal delivery.  I have fully reviewed the prenatal and intrapartum course. The delivery was at 40.1 gestational weeks.  Anesthesia: epidural. Postpartum course has been complicated by gestational hypertension. Baby is doing well. Baby is feeding by breast. Bleeding no bleeding. Bowel function is normal. Bladder function is normal. Patient is not sexually active. Contraception method is abstinence. Postpartum depression screening: negative.  Patient reports feeling that the right side of vaginal laceration is not healed well, wants evaluation of it.   The pregnancy intention screening data noted above was reviewed. Potential methods of contraception were discussed. The patient elected to proceed with Abstinence.    Edinburgh Postnatal Depression Scale - 03/10/21 1544      Edinburgh Postnatal Depression Scale:  In the Past 7 Days   I have been able to laugh and see the funny side of things. 0    I have looked forward with enjoyment to things. 0    I have blamed myself unnecessarily when things went wrong. 0    I have been anxious or worried for no good reason. 0    I have felt scared or panicky for no good reason. 0    Things have been getting on top of me. 0    I have been so unhappy that I have had difficulty sleeping. 0    I have felt sad or miserable. 0    I have been so unhappy that I have been crying. 0    The thought of harming myself has occurred to me. 0    Edinburgh Postnatal Depression Scale Total 0            The following portions of the patient's history were reviewed and updated as appropriate: allergies, current medications, past family history, past medical history, past social history, past surgical history and problem list.  Review of Systems Pertinent items noted in HPI and remainder of  comprehensive ROS otherwise negative.    Objective:  BP 117/79   Pulse 74   Wt 163 lb (73.9 kg)   BMI 30.80 kg/m    General:  alert and no distress   Breasts:  inspection negative, no nipple discharge or bleeding, no masses or nodularity palpable  Lungs: Normal breath sounds  Heart:  regular rate noted  Abdomen: soft, non-tender; bowel sounds normal; no masses,  no organomegaly   Vulva:  normal  Vagina: 1 cm area of distal vaginal near healing laceration appears denuded, likely granulation tissue. No erythema, no drianage        Assessment:   Postpartum exam remarkable for granulation tissue present near laceeration.   Plan:   Essential components of care per ACOG recommendations:  1.  Mood and well being: Patient with negative depression screening today. Reviewed local resources for support.  - Patient does not use tobacco or drugs  2. Infant care and feeding:  -Patient currently breastmilk feeding? Yes Discussed return to work and pumping. If needed, patient can be provided letter for work to allow for every 2-3 hr pumping breaks, and to be granted a private location to express breastmilk and refrigerated area to store breastmilk. Reviewed importance of draining breast regularly to support lactation. -Social determinants of health (SDOH) reviewed in EPIC. No concerns.  3. Sexuality, contraception and birth spacing - Patient does not want a pregnancy  in the next year.    - Reviewed forms of contraception in tiered fashion. Patient desired abstinence today.   - Discussed birth spacing of 18 months  4. Sleep and fatigue -Encouraged family/partner/community support of 4 hrs of uninterrupted sleep to help with mood and fatigue  5. Physical Recovery  - Discussed patients delivery - Patient had a second degree laceration, perineal healing reviewed. Patient expressed understanding - Patient has urinary incontinence? No. - Offered patient application of silver nitrate to  granulation vs expectant management; she wants to wait for now. If not healed by next visit, will consider this. - Patient is not safe to resume physical and sexual activity  6.  Health Maintenance - Last pap smear done 07/21/2020 and was abnormal with ASCUS with positive HPV. Patient will return for repeat pap smear and colposcopy soon.  7. GHTN, postpartum - PCP follow up in 1 month to determine need for further medication    Verita Schneiders, MD [facility] [place]

## 2021-03-10 NOTE — Patient Instructions (Signed)
[email or web address]  Colposcopy  Colposcopy is a procedure to examine the lowest part of the uterus (cervix), the vagina, and the area around the vaginal opening (vulva) for abnormalities or signs of disease. This procedure is done using an instrument that makes objects appear larger and provides light. (colposcope). During the procedure, the health care provider may remove a tissue sample to be looked at later under a microscope (biopsy). A biopsy may be done if any unusual cells are found during the colposcopy. You may have a colposcopy if you have:  An abnormal Pap smear, also called a Pap test. This screening test is used to check for signs of cancer or infection of the vagina, cervix, and uterus.  An HPV (human papillomavirus) test and get a positive result for a type of HPV that puts you at high risk of cancer.  Certain conditions or symptoms, such as: ? A sore, or lesion, on your cervix. ? Genital warts on your vulva, vagina, or cervix. ? Pain during sex. ? Vaginal bleeding, especially after sex.  A growth on your cervix (cervical polyp) that needs to be removed. Let your health care provider know about:  Any allergies you have, including allergies to medicines, latex, or iodine.  All medicines you are taking, including vitamins, herbs, eye drops, creams, and over-the-counter medicines.  Any problems you or family members have had with anesthetic medicines.  Any blood disorders you have.  Any surgeries you have had.  Any medical conditions you have, such as pelvic inflammatory disease (PID) or endometrial disorder.  The pattern of your menstrual cycles and the form of birth control (contraception) you use, if any.  Your medical history, including any history of fainting often or of cervical treatment.  Whether you are pregnant or may be pregnant. What are the risks? Generally, this is a safe  procedure. However, problems may occur, including:  Infection. Symptoms of infection may include fever, bad-smelling vaginal discharge, or pelvic pain.  Allergic reactions to medicines.  Damage to nearby structures or organs.  Fainting. This is rare. What happens before the procedure? Eating and drinking restrictions  Follow instructions from your health care provider about eating or drinking restrictions.  You will likely need to eat a regular diet the day of the procedure and not skip any meals. Tests  You may have an exam or testing. A pregnancy test will be done the day of the procedure.  You may have a blood or urine sample taken. General instructions  Ask your health care provider about: ? Changing or stopping your regular medicines. This is especially important if you are taking diabetes medicines or blood thinners. ? Taking medicines such as aspirin and ibuprofen. These medicines can thin your blood. Do not take these medicines unless your health care provider tells you to take them. Your health care provider will likely tell you to avoid taking aspirin, or medicine that contains aspirin, for 7 days before the procedure. ? Taking over-the-counter medicines, vitamins, herbs, and supplements.  Tell your health care provider if you have your menstrual period now or will have it at the time of your procedure. A colposcopy is not normally done during your menstrual period.  If you use contraception, continue to use it before your procedure.  For 24 hours before the procedure: ? Do not use douche products or tampons. ? Do not use medicines, creams, or suppositories in the vagina. ? Do not have sex.  Ask your health care provider what steps will be  taken to prevent infection. What happens during the procedure?  You will lie down on your back, with your feet in foot rests (stirrups).  A tool called a speculum will be warmed and will have oil or gel put on it (will be  lubricated). The speculum will then be inserted into your vagina. This will be used to hold apart the walls of your vagina so your health care provider can see your cervix and the inside of your vagina.  A cotton swab will be used to place a small amount of a liquid (solution) on the areas to be examined. This solution makes it easier to see abnormal cells. You may feel a slight burning during this part.  The colposcope will be used to scan the cervix with a bright white light. The colposcope will be held near your vulva and will make your vulva, vagina, and cervix look bigger so they can be seen better.  If a biopsy is needed: ? You may be given a medicine to numb the area (local anesthetic). ? Surgical tools will be used to remove mucus and cells through your vagina. ? You may feel mild pain while the tissue sample is removed. ? Bleeding may occur. A solution may be used to stop the bleeding. ? If a biopsy is needed from the inside of the cervix, a different procedure called endocervical curettage (ECC) may be done. During this procedure, a curved tool called a curette will be used to scrape cells from your cervix or the top of your cervix (endocervix).  Any abnormalities that are found will be recorded. The procedure may vary among health care providers and hospitals. What happens after the procedure?  You will lie down and rest for a few minutes. You may be offered juice or cookies.  Your blood pressure, heart rate, breathing rate, and blood oxygen level will be monitored until you leave the hospital or clinic.  You may have some cramping in your abdomen. This should go away after a few minutes.  It is up to you to get the results of your procedure. Ask your health care provider, or the department that is doing the procedure, when your results will be ready. Summary  Colposcopy is a procedure to examine the lowest part of the uterus (cervix), the vagina, and the area around the vaginal  opening (vulva) for abnormalities or signs of disease.  A biopsy may be done as part of the procedure.  After the procedure, you will remain lying down and will rest for a few minutes.  You may have some cramping in your abdomen. This should go away after a few minutes. This information is not intended to replace advice given to you by your health care provider. Make sure you discuss any questions you have with your health care provider. Document Revised: 12/10/2019 Document Reviewed: 12/10/2019 Elsevier Patient Education  2021 [employer].

## 2021-04-04 ENCOUNTER — Other Ambulatory Visit (HOSPITAL_COMMUNITY)
Admission: RE | Admit: 2021-04-04 | Discharge: 2021-04-04 | Disposition: A | Discharge status: Home / Self Care | Source: Ambulatory Visit | Attending: Family Medicine | Admitting: Family Medicine

## 2021-04-04 ENCOUNTER — Encounter: Payer: Self-pay | Admitting: Family Medicine

## 2021-04-04 ENCOUNTER — Other Ambulatory Visit: Payer: Self-pay

## 2021-04-04 ENCOUNTER — Ambulatory Visit (INDEPENDENT_AMBULATORY_CARE_PROVIDER_SITE_OTHER): Admitting: Family Medicine

## 2021-04-04 VITALS — BP 129/86 | HR 86

## 2021-04-04 DIAGNOSIS — R87810 Cervical high risk human papillomavirus (HPV) DNA test positive: Secondary | ICD-10-CM | POA: Diagnosis present

## 2021-04-04 DIAGNOSIS — R87610 Atypical squamous cells of undetermined significance on cytologic smear of cervix (ASC-US): Secondary | ICD-10-CM | POA: Diagnosis present

## 2021-04-04 LAB — POCT URINE PREGNANCY: Preg Test, Ur: NEGATIVE

## 2021-04-04 NOTE — Progress Notes (Signed)
    GYNECOLOGY OFFICE COLPOSCOPY PROCEDURE NOTE  40 y.o. G1P1000 here for colposcopy for ASCUS with POSITIVE high risk HPV pap smear on 06/2020. Discussed role for HPV in cervical dysplasia, need for surveillance.  Patient gave informed written consent, time out was performed.  Placed in lithotomy position. Cervix viewed with speculum and colposcope after application of acetic acid.   Colposcopy adequate? Yes  visible lesion(s) at 6 o'clock; corresponding biopsies obtained = biopsy at 10 and 2 o'clock.  ECC specimen obtained. All specimens were labeled and sent to pathology.  Patient was given post procedure instructions.  Will follow up pathology and manage accordingly; patient will be contacted with results and recommendations.  Routine preventative health maintenance measures emphasized.   Donnamae Jude, MD 04/04/2021 2:25 PM

## 2021-04-04 NOTE — Patient Instructions (Signed)
Colposcopy, Care After This sheet gives you information about how to care for yourself after your procedure. Your doctor may also give you more specific instructions. If you have problems or questions, contact your doctor. What can I expect after the procedure? If you did not have a sample of your tissue taken out (did not have a biopsy), you may only have some spotting of blood for a few days. You can go back to your normal activities. If you had a sample of your tissue taken out, it is common to have:  Soreness and mild pain. These may last for a few days.  A light-headed feeling.  Mild bleeding or fluid (discharge) coming from your vagina. The fluid will look dark and grainy. You may have this for a few days. The fluid may be caused by a liquid that was used during your procedure. You may need to wear a sanitary pad.  Spotting of blood for at least 48 hours after the procedure. Follow these instructions at home: Medicines  Take over-the-counter and prescription medicines only as told by your doctor.  Ask your doctor what medicines you can start taking again. This is very important if you take blood thinners. Activity  Limit your activity for the first day after your procedure as told by your doctor.  For at least 3 days, or for as long as told by your doctor, avoid: ? Douching. ? Using tampons. ? Having sex.  Return to your normal activities as told by your doctor. Ask your doctor what activities are safe for you. General instructions  Drink enough fluid to keep your pee (urine) pale yellow.  Ask your doctor if you may take baths, swim, or use a hot tub. You may take showers.  If you use birth control (contraception), keep using it.  Keep all follow-up visits as told by your doctor. This is important.   Contact a doctor if:  You get a skin rash. Get help right away if:  You bleed a lot from your vagina. A lot of bleeding means you use more than one pad an hour for 2  hours in a row.  You have clumps of blood (blood clots) coming from your vagina.  You have a fever or chills.  You have signs of infection. This may be fluid coming from your vagina that is: ? Different than normal. ? Yellow. ? Bad-smelling.  You have very bad pain or cramps in your lower belly that do not get better with medicine.  You faint. Summary  If you did not have a sample of your tissue taken out, you may only have some spotting of blood for a few days. You can go back to your normal activities.  If you had a sample of your tissue taken out, it is common to have mild pain for a few days and spotting for 48 hours.  Avoid douching, using tampons, and having sex for at least 3 days after the procedure or for as long as told.  Get help right away if you have a lot of bleeding, very bad pain, or signs of infection. This information is not intended to replace advice given to you by your health care provider. Make sure you discuss any questions you have with your health care provider. Document Revised: 10/12/2020 Document Reviewed: 12/10/2019 Elsevier Patient Education  2021 [employer].

## 2021-04-08 LAB — CYTOLOGY - PAP
Comment: NEGATIVE
Diagnosis: NEGATIVE
Diagnosis: REACTIVE
High risk HPV: NEGATIVE

## 2021-04-14 ENCOUNTER — Encounter: Payer: Self-pay | Admitting: Nurse Practitioner

## 2021-04-19 ENCOUNTER — Encounter: Payer: Self-pay | Admitting: Obstetrics & Gynecology

## 2021-04-19 ENCOUNTER — Other Ambulatory Visit: Payer: Self-pay

## 2021-04-19 ENCOUNTER — Ambulatory Visit (INDEPENDENT_AMBULATORY_CARE_PROVIDER_SITE_OTHER): Admitting: Obstetrics & Gynecology

## 2021-04-19 VITALS — BP 131/88 | HR 88 | Ht 61.0 in | Wt 163.4 lb

## 2021-04-19 DIAGNOSIS — L929 Granulomatous disorder of the skin and subcutaneous tissue, unspecified: Secondary | ICD-10-CM

## 2021-04-19 NOTE — Progress Notes (Signed)
GYNECOLOGY OFFICE VISIT NOTE  History:   Jaclyn Gray is a 40 y.o. G1P1001 here today for treatment of symptomatic granulation tissue noted around her healing perineal laceration. She was offered silver nitrate treatment during last visit on 03/10/2021, but she declined this at the time.  She reports that it is very bothersome and tender for her, she now desires silver nitrate treatment. She denies any abnormal vaginal discharge, bleeding, other pelvic pain or other concerns.    Past Medical History:  Diagnosis Date  . 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 At risk for fertility problems 08/02/2019  . Generalized anxiety disorder 05/24/2020  . Gestational hypertension 02/10/2021  . Low back pain 05/07/2017  . Uterine leiomyoma 10/26/2020   At anatomy u/s 09/15/20:  Myomas  Site                     L(cm)      W(cm)      D(cm)       Location  Posterior                5.7        4.7        5.4         Intramural  Posterior                3.5        3.7        4           Intramural  Posterior                3.3        2.6        3.2         Intramural  . 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 WISDOM TOOTH EXTRACTION     x4 extracted     The following portions of the patient's history were reviewed and updated as appropriate: allergies, current medications, past family history, past medical history, past social history, past surgical history and problem list.   Health Maintenance:  Normal pap and negative HRHPV on  04/04/2021.   Review of Systems:  Pertinent items noted in HPI and remainder of comprehensive ROS otherwise negative.  Physical Exam:  BP 131/88   Pulse 88   Ht 5\' 1"  (1.549 m)   Wt 163 lb 6.4 oz (74.1 kg)   Breastfeeding Yes   BMI 30.87 kg/m  CONSTITUTIONAL: Well-developed, well-nourished female in no acute distress.  MUSCULOSKELETAL: Normal range of  motion. No edema noted. NEUROLOGIC: Alert and oriented to person, place, and time. Normal muscle tone coordination. No cranial nerve deficit noted. PSYCHIATRIC: Normal mood and affect. Normal behavior. Normal judgment and thought content. CARDIOVASCULAR: Normal heart rate noted RESPIRATORY: Effort and breath sounds normal, no problems with respiration noted ABDOMEN: No masses noted. No other overt distention noted.   PELVIC: 1 cm area of distal vagina with granulation tissue. Appears slightly better healed compared to previous examination on 03/10/2021. No erythema, no drainage.  Silver nitrate applied to area. Patient tolerated this well. Done in the presence of a chaperone.     Assessment and Plan:      Granulation tissue at obstetrical laceration site She is s/p treatment with silver nitrate Routine perineal care discussed Reevaluate in one week, may need second treatment.  I spent 15 minutes dedicated to the care of this patient including pre-visit review of records, face to face time with the patient discussing her conditions and treatments and post visit ordering of testing.    Verita Schneiders, MD, [place] [facility], [place]

## 2021-04-26 ENCOUNTER — Encounter: Payer: Self-pay | Admitting: Obstetrics and Gynecology

## 2021-04-26 ENCOUNTER — Other Ambulatory Visit: Payer: Self-pay

## 2021-04-26 ENCOUNTER — Ambulatory Visit (INDEPENDENT_AMBULATORY_CARE_PROVIDER_SITE_OTHER): Admitting: Obstetrics and Gynecology

## 2021-04-26 VITALS — BP 131/85 | HR 78

## 2021-04-26 DIAGNOSIS — L929 Granulomatous disorder of the skin and subcutaneous tissue, unspecified: Secondary | ICD-10-CM | POA: Diagnosis not present

## 2021-04-26 NOTE — Progress Notes (Signed)
40 yo s/p SVD in February 2022 here for follow up on perineal laceration. Patient underwent treatment with silver nitrate on 4/26. She reports significant improvement in her symptoms. She denies any pain with urination or washing with soap. Patient is overall without complaints.  Past Medical History:  Diagnosis Date  . 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 At risk for fertility problems 08/02/2019  . Generalized anxiety disorder 05/24/2020  . Gestational hypertension 02/10/2021  . Low back pain 05/07/2017  . Uterine leiomyoma 10/26/2020   At anatomy u/s 09/15/20:  Myomas  Site                     L(cm)      W(cm)      D(cm)       Location  Posterior                5.7        4.7        5.4         Intramural  Posterior                3.5        3.7        4           Intramural  Posterior                3.3        2.6        3.2         Intramural  . 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   Past Surgical History:  Procedure Laterality Date  . WISDOM TOOTH EXTRACTION     x4 extracted    Family History  Problem Relation Age of Onset  . Hypertension Mother   . Breast cancer Mother 48  . Prostate cancer Father   . Hypertension Father   . Diabetes Father   . Throat cancer Maternal Grandmother   . Colon cancer Maternal Grandmother   . Lung cancer Maternal Grandfather    Social History   Tobacco Use  . Smoking status: Never Smoker  . Smokeless tobacco: Never Used  Vaping Use  . Vaping Use: Never used  Substance Use Topics  . Alcohol use: Yes    Comment: social   . Drug use: Never   ROS See pertinent in HPI. All other systems reviewed and non contributory  Blood pressure 131/85, pulse 78, currently breastfeeding.  GENERAL: Well-developed, well-nourished female in no acute distress.  HEENT: Normocephalic, atraumatic. Sclerae anicteric.  ABDOMEN: Soft, nontender, nondistended. No organomegaly. PELVIC: Vaginal mucosa at  introitus normal with 3 pin point specks of granulation tissue No erythema, no drainage.    A/P 40 yo here for follow up on granulation tissue on obstetrical laceration - Reassurance provided - No further silver nitrate application needed - RTC in 1 year or prn

## 2021-05-10 ENCOUNTER — Ambulatory Visit: Admitting: Internal Medicine

## 2021-06-03 ENCOUNTER — Ambulatory Visit: Admitting: Nurse Practitioner

## 2021-06-09 ENCOUNTER — Other Ambulatory Visit: Payer: Self-pay

## 2021-06-09 ENCOUNTER — Ambulatory Visit
Admission: RE | Admit: 2021-06-09 | Discharge: 2021-06-09 | Disposition: A | Discharge status: Home / Self Care | Attending: Nurse Practitioner | Admitting: Nurse Practitioner

## 2021-06-09 ENCOUNTER — Ambulatory Visit (INDEPENDENT_AMBULATORY_CARE_PROVIDER_SITE_OTHER): Admitting: Nurse Practitioner

## 2021-06-09 ENCOUNTER — Encounter: Payer: Self-pay | Admitting: Nurse Practitioner

## 2021-06-09 ENCOUNTER — Ambulatory Visit
Admission: RE | Admit: 2021-06-09 | Discharge: 2021-06-09 | Disposition: A | Discharge status: Home / Self Care | Source: Ambulatory Visit | Attending: Nurse Practitioner | Admitting: Nurse Practitioner

## 2021-06-09 VITALS — BP 116/86 | HR 75 | Temp 97.4°F | Resp 16 | Ht 61.0 in | Wt 160.0 lb

## 2021-06-09 DIAGNOSIS — M25552 Pain in left hip: Secondary | ICD-10-CM | POA: Diagnosis not present

## 2021-06-09 DIAGNOSIS — M25531 Pain in right wrist: Secondary | ICD-10-CM

## 2021-06-09 DIAGNOSIS — M25551 Pain in right hip: Secondary | ICD-10-CM

## 2021-06-09 IMAGING — CR DG HIP (WITH OR WITHOUT PELVIS) 2-3V*L*
1 series · 3 of 3 positions shown · non-contrast
Comparison: None.

CLINICAL DATA: Left hip pain.  No injury.  Weakness.

EXAM:
DG HIP (WITH OR WITHOUT PELVIS) 2-3V LEFT

[Series 1: dg hip unilat w or w/o pelvis 2-3 views  · non-contrast · 0.14mm/px · 3 of 3 slices shown]
[im 1/3]
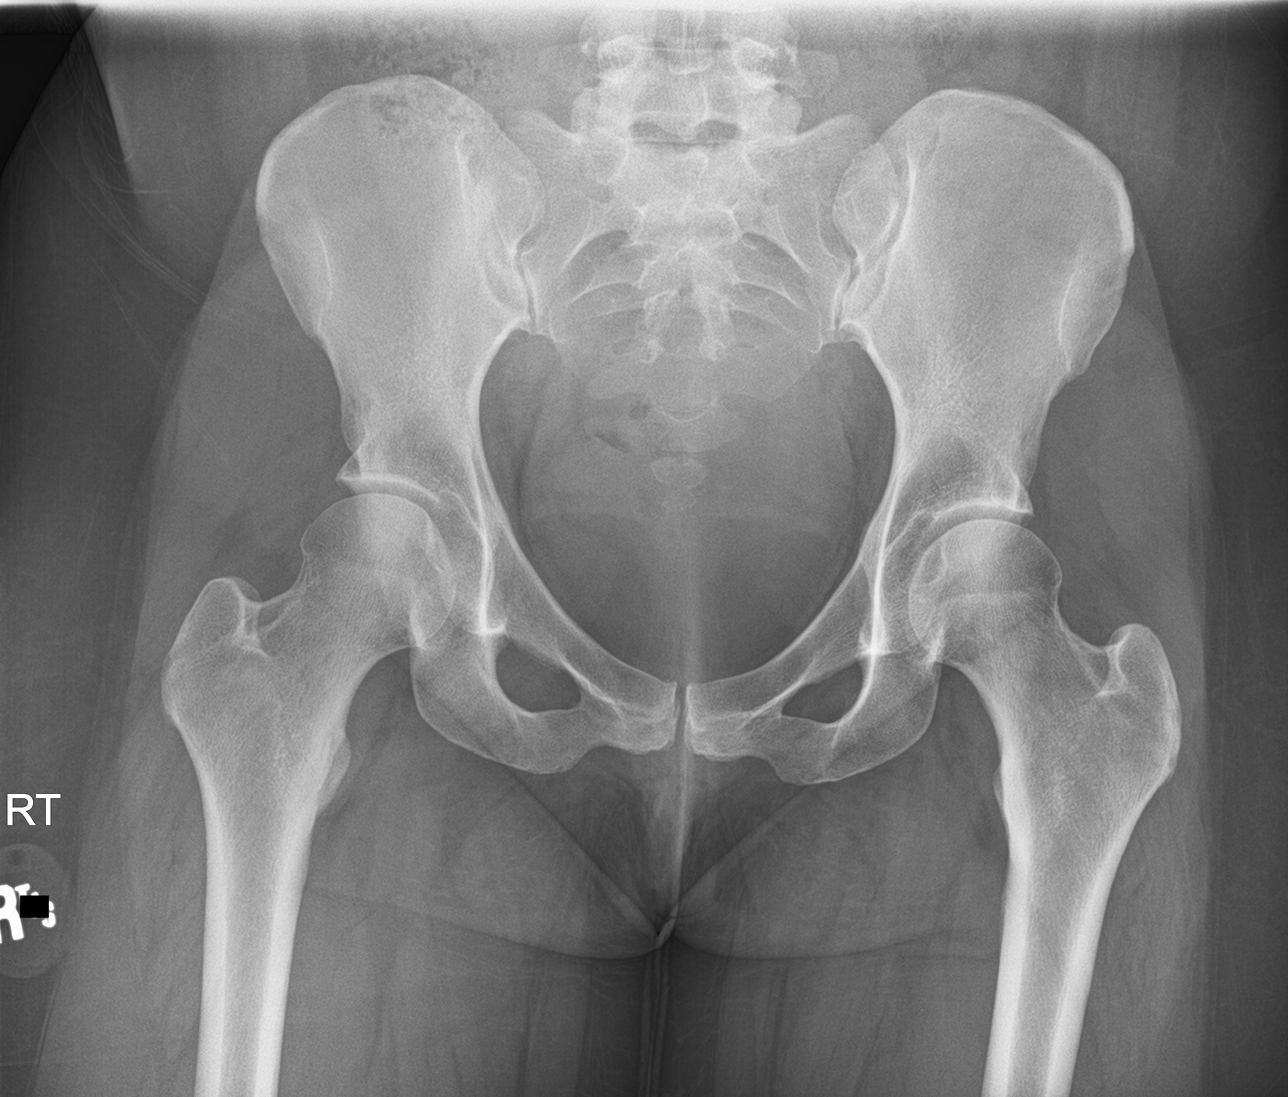
[im 2/3]
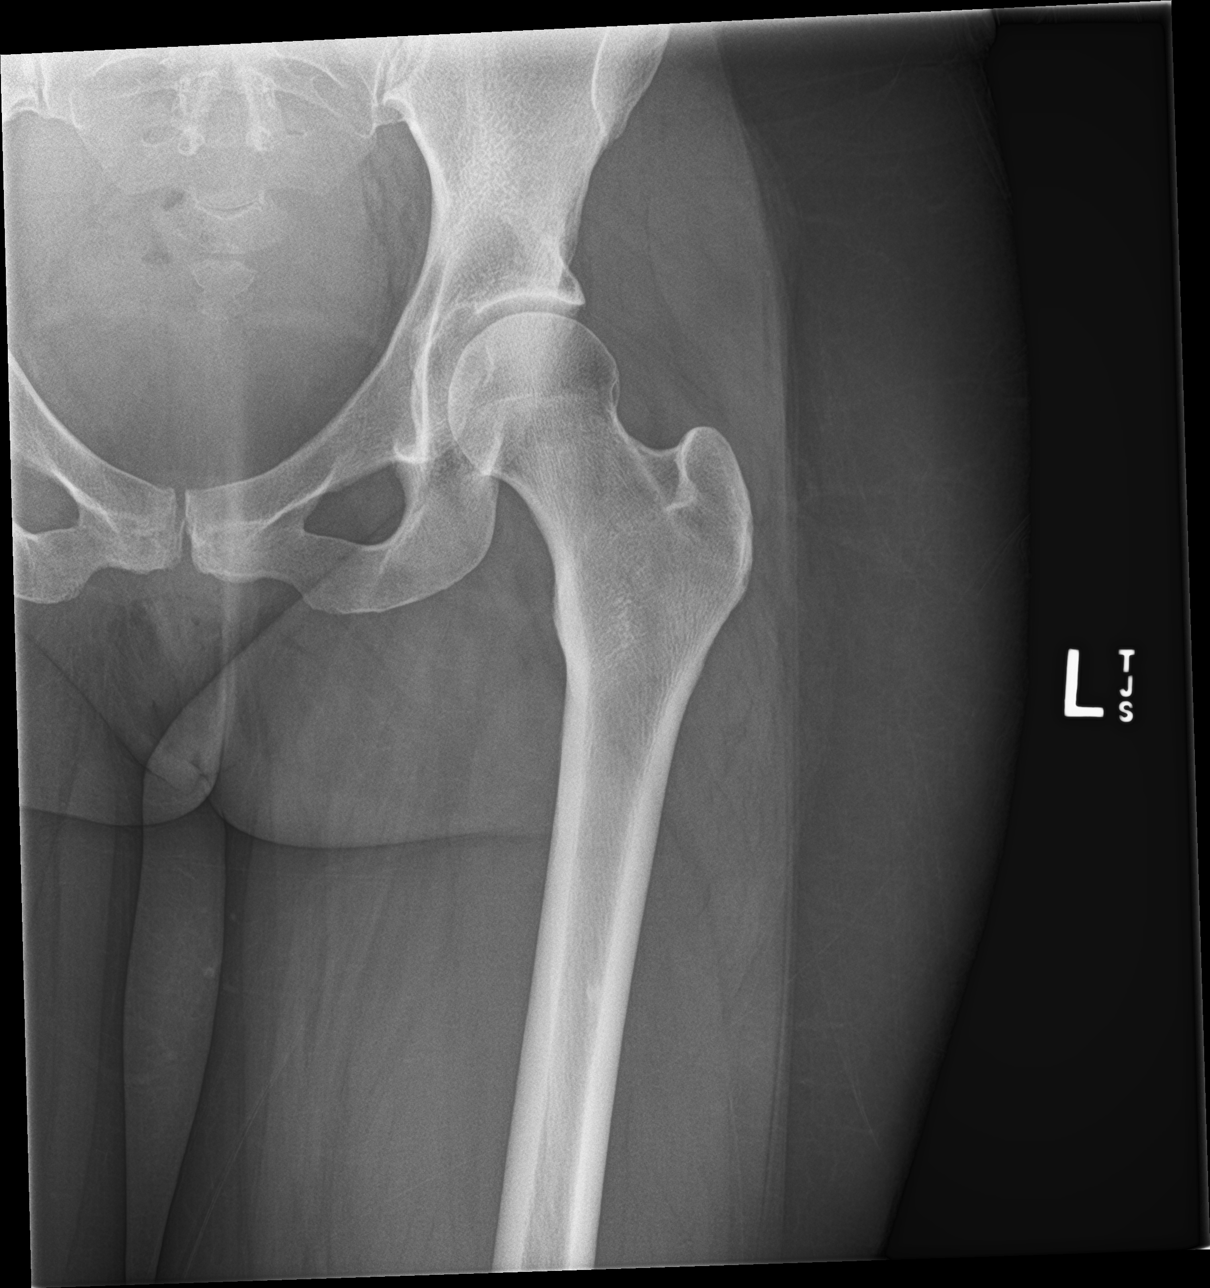
[im 3/3]
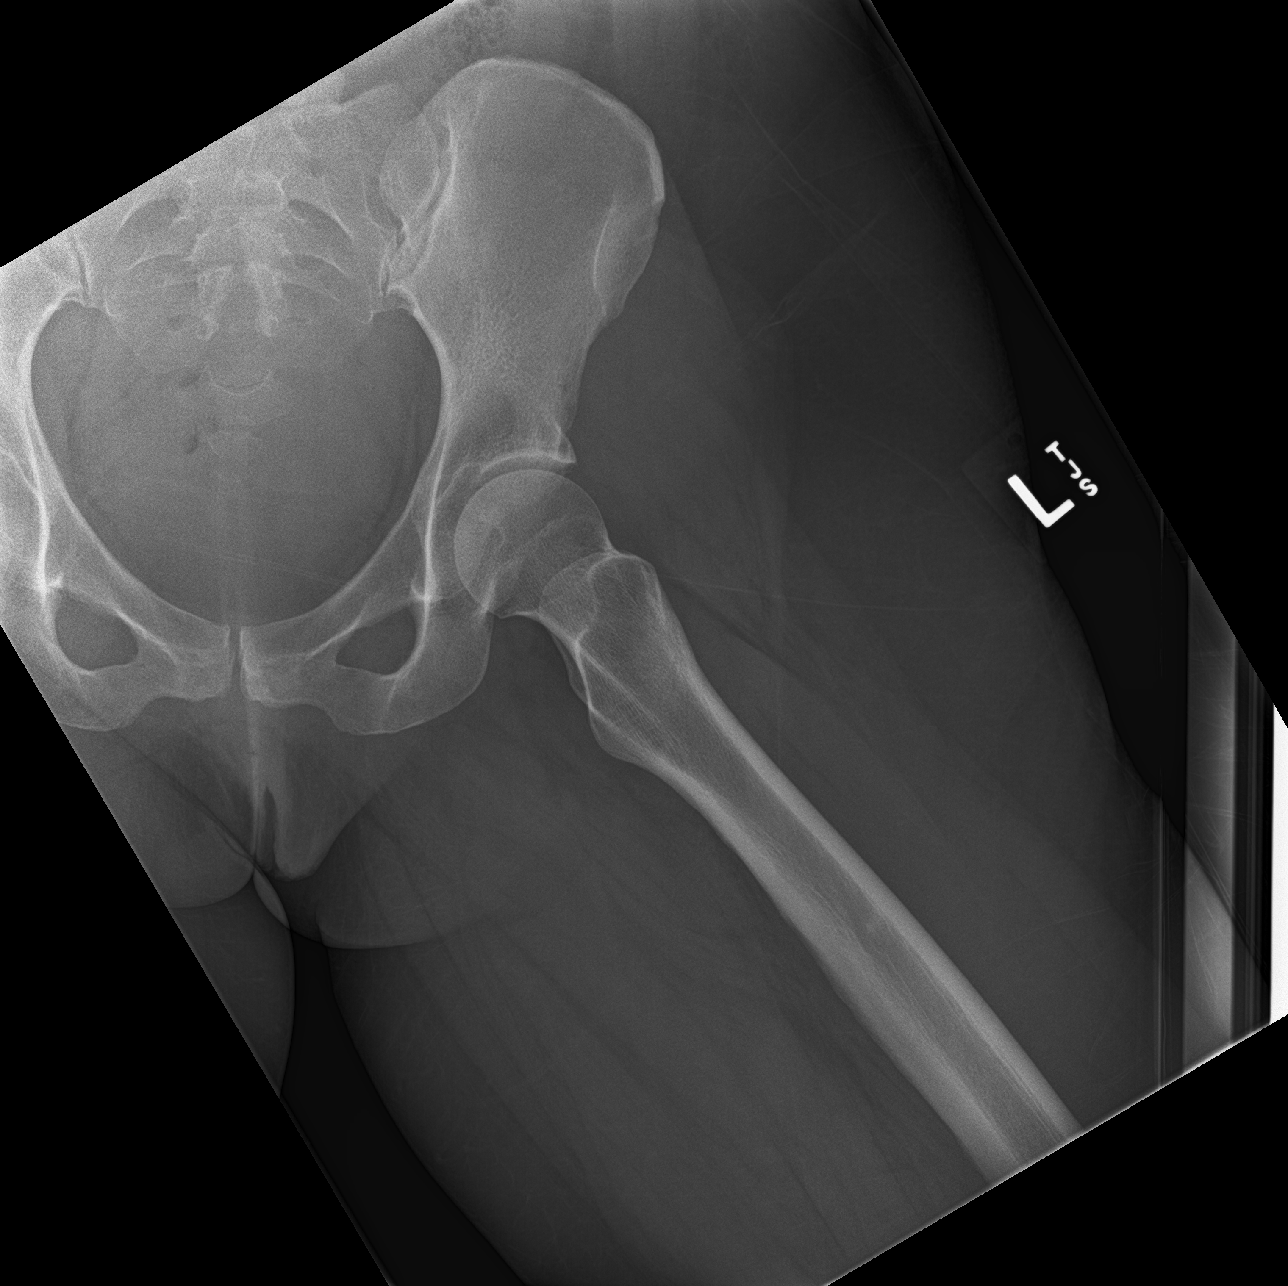

[3 of 3 positions shown; findings below may reference images not displayed]

FINDINGS: There is no evidence of hip fracture or dislocation. There is no
evidence of arthropathy or other focal bone abnormality.
IMPRESSION: Negative.

## 2021-06-09 NOTE — Progress Notes (Signed)
[facility] [place], [place] [address]  Internal MEDICINE  Office Visit Note  Patient Name: Jaclyn Gray  [id number]  [id number]  Date of Service: 06/18/2021  Chief Complaint  Patient presents with   Acute Visit    HIP WEAKNESS, AND WRIST/THUMB ISSUES. Noticed in march, when nursing daughter baby's head rests on previously injured wrist       HPI Patient reports bilateral hip weakness since she had her daughter who is 26 months old now. She also has wrist and thumb pain and weakness on the right wrist/hand. She had a prior injury to her right wrist which also complicates the issue. Requesting orthopedic referral.    Current Medication:  Outpatient Encounter Medications as of 06/09/2021  Medication Sig   Prenatal Vit-Fe Fumarate-FA (MULTIVITAMIN-PRENATAL) 27-0.8 MG TABS tablet Take 1 tablet by mouth daily at 12 noon.   [DISCONTINUED] enalapril (VASOTEC) 5 MG tablet Take 2 tablets (10 mg total) by mouth daily. (Patient not taking: No sig reported)   [DISCONTINUED] ibuprofen (ADVIL) 600 MG tablet Take 1 tablet (600 mg total) by mouth every 6 (six) hours as needed. (Patient not taking: No sig reported)   No facility-administered encounter medications on file as of 06/09/2021.      Medical History: Past Medical History:  Diagnosis Date   ASCUS with positive high risk HPV cervical on 07/21/2020 07/29/2020   At risk for fertility problems 08/02/2019   Generalized anxiety disorder 05/24/2020   Gestational hypertension 02/10/2021   Low back pain 05/07/2017   Uterine leiomyoma 10/26/2020   At anatomy u/s 09/15/20:  Myomas  Site                     L(cm)      W(cm)      D(cm)       Location  Posterior                5.7        4.7        5.4         Intramural  Posterior                3.5        3.7        4           Intramural  Posterior                3.3        2.6        3.2         Intramural   Varicose veins of both lower extremities with inflammation  08/02/2019     Vital Signs: BP 116/86   Pulse 75   Temp (!) 97.4 F (36.3 C)   Resp 16   Ht 5\' 1"  (1.549 m)   Wt 160 lb (72.6 kg)   SpO2 97%   BMI 30.23 kg/m    Review of Systems  Constitutional:  Negative for chills, fatigue and unexpected weight change.  HENT:  Negative for congestion, rhinorrhea, sneezing and sore throat.   Eyes:  Negative for redness.  Respiratory:  Negative for cough, chest tightness and shortness of breath.   Cardiovascular:  Negative for chest pain and palpitations.  Gastrointestinal:  Negative for abdominal pain, constipation, diarrhea, nausea and vomiting.  Genitourinary:  Negative for dysuria and frequency.  Musculoskeletal:  Positive for arthralgias and back pain. Negative for joint swelling and neck pain.  Right wrist pain, bilateral hip pain and weakness.   Skin:  Negative for rash.  Neurological: Negative.  Negative for tremors and numbness.  Hematological:  Negative for adenopathy. Does not bruise/bleed easily.  Psychiatric/Behavioral:  Negative for behavioral problems (Depression), sleep disturbance and suicidal ideas. The patient is not nervous/anxious.    Physical Exam Vitals reviewed.  Constitutional:      General: She is not in acute distress.    Appearance: She is well-developed. She is not diaphoretic.  HENT:     Head: Normocephalic and atraumatic.     Mouth/Throat:     Pharynx: No oropharyngeal exudate.  Eyes:     Pupils: Pupils are equal, round, and reactive to light.  Neck:     Thyroid: No thyromegaly.     Vascular: No JVD.     Trachea: No tracheal deviation.  Cardiovascular:     Rate and Rhythm: Normal rate and regular rhythm.     Pulses: Normal pulses.     Heart sounds: Normal heart sounds. No murmur heard.   No friction rub. No gallop.  Pulmonary:     Effort: Pulmonary effort is normal. No respiratory distress.     Breath sounds: Normal breath sounds. No wheezing or rales.  Chest:     Chest wall: No tenderness.   Abdominal:     General: Bowel sounds are normal.     Palpations: Abdomen is soft.  Musculoskeletal:     Cervical back: Normal range of motion and neck supple.  Lymphadenopathy:     Cervical: No cervical adenopathy.  Skin:    General: Skin is warm and dry.     Capillary Refill: Capillary refill takes less than 2 seconds.  Neurological:     Mental Status: She is alert and oriented to person, place, and time.  Psychiatric:        Behavior: Behavior normal.        Thought Content: Thought content normal.        Judgment: Judgment normal.    Assessment/Plan: 1. Bilateral hip pain Hip pain after childbirth with weakness, xrays ordered. May consider referral for Hips to [facility] as well.  - DG Hip Unilat W OR W/O Pelvis 2-3 Views Left; Future - DG Hip Unilat W OR W/O Pelvis 2-3 Views Right; Future  2. Wrist pain, acute, right Referral made to Dr. Peggye Ley at [facility] - Ambulatory referral to Orthopedic Surgery   General Counseling: Misako verbalizes understanding of the findings of todays visit and agrees with plan of treatment. I have discussed any further diagnostic evaluation that may be needed or ordered today. We also reviewed her medications today. she has been encouraged to call the office with any questions or concerns that should arise related to todays visit.    Counseling:    Orders Placed This Encounter  Procedures   DG Hip Unilat W OR W/O Pelvis 2-3 Views Left   DG Hip Unilat W OR W/O Pelvis 2-3 Views Right   Ambulatory referral to Orthopedic Surgery    No orders of the defined types were placed in this encounter.   [place] Controlled Substance Database was reviewed by me.  Time spent:30 Minutes  Return if symptoms worsen or fail to improve.  This patient was seen by [name], FNP-C in collaboration with Dr. [name] as a part of collaborative care agreement.  Stevenson Windmiller R. Valetta Fuller, MSN, FNP-C Internal Medicine

## 2021-06-14 ENCOUNTER — Encounter: Payer: Self-pay | Admitting: Nurse Practitioner

## 2021-06-24 ENCOUNTER — Encounter: Payer: Self-pay | Admitting: Nurse Practitioner

## 2021-07-01 ENCOUNTER — Encounter: Payer: Self-pay | Admitting: Nurse Practitioner

## 2021-07-01 ENCOUNTER — Ambulatory Visit (INDEPENDENT_AMBULATORY_CARE_PROVIDER_SITE_OTHER): Admitting: Nurse Practitioner

## 2021-07-01 ENCOUNTER — Other Ambulatory Visit: Payer: Self-pay

## 2021-07-01 VITALS — BP 118/84 | HR 86 | Temp 98.3°F | Resp 16 | Ht 61.0 in | Wt 156.4 lb

## 2021-07-01 DIAGNOSIS — N3000 Acute cystitis without hematuria: Secondary | ICD-10-CM | POA: Diagnosis not present

## 2021-07-01 DIAGNOSIS — R300 Dysuria: Secondary | ICD-10-CM | POA: Diagnosis not present

## 2021-07-01 DIAGNOSIS — M25531 Pain in right wrist: Secondary | ICD-10-CM

## 2021-07-01 DIAGNOSIS — M25552 Pain in left hip: Secondary | ICD-10-CM

## 2021-07-01 DIAGNOSIS — M25551 Pain in right hip: Secondary | ICD-10-CM | POA: Diagnosis not present

## 2021-07-01 LAB — POCT URINALYSIS DIPSTICK
Bilirubin, UA: NEGATIVE
Glucose, UA: NEGATIVE
Ketones, UA: NEGATIVE
Nitrite, UA: NEGATIVE
Protein, UA: NEGATIVE
Spec Grav, UA: 1.01 (ref 1.010–1.025)
Urobilinogen, UA: 0.2 E.U./dL
pH, UA: 6 (ref 5.0–8.0)

## 2021-07-01 MED ORDER — SULFAMETHOXAZOLE-TRIMETHOPRIM 800-160 MG PO TABS: 1.0000 | ORAL_TABLET | Freq: Two times a day (BID) | ORAL | 0 refills | Status: AC

## 2021-07-01 NOTE — Progress Notes (Signed)
[facility] [place], [place] [address]  Internal MEDICINE  Office Visit Note  Patient Name: Jaclyn Gray  [id number]  [id number]  Date of Service: 07/01/2021  Chief Complaint  Patient presents with   Acute Visit    Patient has had pain in both hip when sitting down for a period of time, vaginal sensitivity      HPI Jaclyn Gray presents for an acute sick visit for bilateral hip pain and vaginal sensitivity and burning.  She continues to have right wrist pain. She is seeing Dr. Peggye Ley at [facility] for this. She has a brace on her wrist and is currently on light duty for work. She is active duty Corporate treasurer. She is still breastfeeding her 79 month old daughter and is requesting a letter to extend her exemption to the COVID vaccine. She initially asked for exemption through November. AAP formerly recommended at least 1 year or more of breastfeeding, due to further research the recommendation has extended through 2+ years of breastfeeding due to the continued benefits for both mother and baby.    Current Medication: Outpatient Encounter Medications as of 07/01/2021  Medication Sig   Prenatal Vit-Fe Fumarate-FA (MULTIVITAMIN-PRENATAL) 27-0.8 MG TABS tablet Take 1 tablet by mouth daily at 12 noon.   [EXPIRED] sulfamethoxazole-trimethoprim (BACTRIM DS) 800-160 MG tablet Take 1 tablet by mouth 2 (two) times daily for 5 days.   No facility-administered encounter medications on file as of 07/01/2021.    Surgical History: Past Surgical History:  Procedure Laterality Date   WISDOM TOOTH EXTRACTION     x4 extracted     Medical History: Past Medical History:  Diagnosis Date   ASCUS with positive high risk HPV cervical on 07/21/2020 07/29/2020   At risk for fertility problems 08/02/2019   Generalized anxiety disorder 05/24/2020   Gestational hypertension 02/10/2021   Low back pain 05/07/2017   Uterine leiomyoma 10/26/2020   At anatomy u/s 09/15/20:  Myomas  Site                      L(cm)      W(cm)      D(cm)       Location  Posterior                5.7        4.7        5.4         Intramural  Posterior                3.5        3.7        4           Intramural  Posterior                3.3        2.6        3.2         Intramural   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    Family History: Family History  Problem Relation Age of Onset   Hypertension Mother    Breast cancer Mother 36   Prostate cancer Father    Hypertension Father    Diabetes Father    Throat cancer Maternal Grandmother    Colon cancer Maternal Grandmother    Lung cancer Maternal Grandfather     Social History   Socioeconomic History   Marital status: Single    Spouse name: Not  on file   Number of children: Not on file   Years of education: Not on file   Highest education level: Not on file  Occupational History   Occupation: Nature conservation officer  Tobacco Use   Smoking status: Never   Smokeless tobacco: Never  Vaping Use   Vaping Use: Never used  Substance and Sexual Activity   Alcohol use: Yes    Comment: social    Drug use: Never   Sexual activity: Not Currently    Birth control/protection: None  Other Topics Concern   Not on file  Social History Narrative   Not on file   Social Determinants of Health   Financial Resource Strain: Not on file  Food Insecurity: Not on file  Transportation Needs: Not on file  Physical Activity: Not on file  Stress: Not on file  Social Connections: Not on file  Intimate Partner Violence: Not on file      Review of Systems  Constitutional:  Negative for chills, fatigue and unexpected weight change.  HENT:  Negative for congestion, rhinorrhea, sneezing and sore throat.   Eyes:  Negative for redness.  Respiratory:  Negative for cough, chest tightness and shortness of breath.   Cardiovascular:  Negative for chest pain and palpitations.  Gastrointestinal:  Negative for abdominal pain, constipation, diarrhea, nausea and  vomiting.  Genitourinary:  Negative for dysuria and frequency.  Musculoskeletal:  Negative for arthralgias, back pain, joint swelling and neck pain.  Skin:  Negative for rash.  Neurological: Negative.  Negative for tremors and numbness.  Hematological:  Negative for adenopathy. Does not bruise/bleed easily.  Psychiatric/Behavioral:  Negative for behavioral problems (Depression), sleep disturbance and suicidal ideas. The patient is not nervous/anxious.    Vital Signs: BP 118/84   Pulse 86   Temp 98.3 F (36.8 C)   Resp 16   Ht 5\' 1"  (1.549 m)   Wt 156 lb 6.4 oz (70.9 kg)   SpO2 97%   BMI 29.55 kg/m    Physical Exam Vitals reviewed.  Constitutional:      General: She is not in acute distress.    Appearance: Normal appearance. She is well-developed. She is not ill-appearing or diaphoretic.  HENT:     Head: Normocephalic and atraumatic.  Neck:     Thyroid: No thyromegaly.     Vascular: No JVD.     Trachea: No tracheal deviation.  Cardiovascular:     Rate and Rhythm: Normal rate and regular rhythm.     Heart sounds: Normal heart sounds. No murmur heard.   No friction rub. No gallop.  Pulmonary:     Effort: Pulmonary effort is normal. No respiratory distress.     Breath sounds: No wheezing or rales.  Chest:     Chest wall: No tenderness.  Skin:    General: Skin is warm and dry.     Capillary Refill: Capillary refill takes less than 2 seconds.  Neurological:     Mental Status: She is alert and oriented to person, place, and time.  Psychiatric:        Mood and Affect: Mood normal.        Behavior: Behavior normal.     Assessment/Plan: 1. Acute cystitis without hematuria 5 day course of bactrim prescribed to patient, it is the safest choice to treat a UTI in a breastfeeding mother.   2. Dysuria Urine culture and dip specimen obtained to determine cause of infection.  - POCT Urinalysis Dipstick - CULTURE, URINE COMPREHENSIVE  3. Bilateral  hip pain Still having  bilateral hip pain, may be residual pain from childbirth and stretched ligaments. Patient agreed to give it 2 or 3 more months so improve on its own.   4. Wrist pain, acute, right Seeing Dr. Peggye Ley, hand specialist at [facility].    General Counseling: Mckinzie verbalizes understanding of the findings of todays visit and agrees with plan of treatment. I have discussed any further diagnostic evaluation that may be needed or ordered today. We also reviewed her medications today. she has been encouraged to call the office with any questions or concerns that should arise related to todays visit.    Orders Placed This Encounter  Procedures   CULTURE, URINE COMPREHENSIVE   POCT Urinalysis Dipstick    Meds ordered this encounter  Medications   sulfamethoxazole-trimethoprim (BACTRIM DS) 800-160 MG tablet    Sig: Take 1 tablet by mouth 2 (two) times daily for 5 days.    Dispense:  10 tablet    Refill:  0    Return if symptoms worsen or fail to improve.   Total time spent:30 Minutes Time spent includes review of chart, medications, test results, and follow up plan with the patient.   [place] Controlled Substance Database was reviewed by me.  This patient was seen by [name], FNP-C in collaboration with Dr. [name] as a part of collaborative care agreement.   Torryn Fiske R. Valetta Fuller, MSN, FNP-C Internal medicine

## 2021-07-05 LAB — CULTURE, URINE COMPREHENSIVE

## 2021-07-06 ENCOUNTER — Ambulatory Visit: Admitting: Nurse Practitioner

## 2021-10-12 ENCOUNTER — Encounter: Payer: Self-pay | Admitting: Nurse Practitioner

## 2021-10-12 ENCOUNTER — Other Ambulatory Visit: Payer: Self-pay

## 2021-10-12 ENCOUNTER — Ambulatory Visit (INDEPENDENT_AMBULATORY_CARE_PROVIDER_SITE_OTHER): Admitting: Nurse Practitioner

## 2021-10-12 VITALS — BP 115/80 | HR 79 | Temp 98.5°F | Resp 16 | Ht 61.0 in | Wt 147.0 lb

## 2021-10-12 DIAGNOSIS — M25531 Pain in right wrist: Secondary | ICD-10-CM

## 2021-10-12 DIAGNOSIS — E782 Mixed hyperlipidemia: Secondary | ICD-10-CM

## 2021-10-12 DIAGNOSIS — Z0001 Encounter for general adult medical examination with abnormal findings: Secondary | ICD-10-CM

## 2021-10-12 DIAGNOSIS — E559 Vitamin D deficiency, unspecified: Secondary | ICD-10-CM

## 2021-10-12 DIAGNOSIS — R7301 Impaired fasting glucose: Secondary | ICD-10-CM

## 2021-10-12 DIAGNOSIS — G8929 Other chronic pain: Secondary | ICD-10-CM

## 2021-10-12 DIAGNOSIS — R300 Dysuria: Secondary | ICD-10-CM

## 2021-10-12 DIAGNOSIS — D649 Anemia, unspecified: Secondary | ICD-10-CM

## 2021-10-12 NOTE — Progress Notes (Signed)
[facility] [place], [place] [address]  Internal MEDICINE  Office Visit Note  Patient Name: Jaclyn Gray  [id number]  [id number]  Date of Service: 10/12/2021  Chief Complaint  Patient presents with   Annual Exam    Right wrist and both hips hurt, right hip worse, discuss doctors note   Anxiety   Hypertension    HPI Jaclyn Gray presents for an annual well visit and physical exam. She is a well-appearing 40 yo female. She continues to have right wrist pain and bilateral hip pain. She previously asked for a letter requesting exemption for covid vaccine due to breastfeeding. Her request was denied and now she is having to go to court to plead her case so she does not lose her job. She is requesting another letter requesting and explaining need for exemption due to breastfeeding. Her daughter is less than 67 yo and the American Academy of Pediatricians recommend breast for at least 1 year  and encourage continued breastfeeding up to 2 years.     Current Medication: Outpatient Encounter Medications as of 10/12/2021  Medication Sig   Prenatal Vit-Fe Fumarate-FA (MULTIVITAMIN-PRENATAL) 27-0.8 MG TABS tablet Take 1 tablet by mouth daily at 12 noon.   No facility-administered encounter medications on file as of 10/12/2021.    Surgical History: Past Surgical History:  Procedure Laterality Date   WISDOM TOOTH EXTRACTION     x4 extracted     Medical History: Past Medical History:  Diagnosis Date   ASCUS with positive high risk HPV cervical on 07/21/2020 07/29/2020   At risk for fertility problems 08/02/2019   Generalized anxiety disorder 05/24/2020   Gestational hypertension 02/10/2021   Low back pain 05/07/2017   Uterine leiomyoma 10/26/2020   At anatomy u/s 09/15/20:  Myomas  Site                     L(cm)      W(cm)      D(cm)       Location  Posterior                5.7        4.7        5.4         Intramural  Posterior                3.5        3.7        4            Intramural  Posterior                3.3        2.6        3.2         Intramural   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    Family History: Family History  Problem Relation Age of Onset   Hypertension Mother    Breast cancer Mother 89   Prostate cancer Father    Hypertension Father    Diabetes Father    Throat cancer Maternal Grandmother    Colon cancer Maternal Grandmother    Lung cancer Maternal Grandfather     Social History   Socioeconomic History   Marital status: Single    Spouse name: Not on file   Number of children: Not on file   Years of education: Not on file   Highest education level: Not on file  Occupational  History   Occupation: Nature conservation officer  Tobacco Use   Smoking status: Never   Smokeless tobacco: Never  Vaping Use   Vaping Use: Never used  Substance and Sexual Activity   Alcohol use: Not Currently    Comment: social    Drug use: Never   Sexual activity: Not Currently    Birth control/protection: None  Other Topics Concern   Not on file  Social History Narrative   Not on file   Social Determinants of Health   Financial Resource Strain: Not on file  Food Insecurity: Not on file  Transportation Needs: Not on file  Physical Activity: Not on file  Stress: Not on file  Social Connections: Not on file  Intimate Partner Violence: Not on file      Review of Systems  Constitutional:  Negative for activity change, appetite change, chills, fatigue, fever and unexpected weight change.  HENT: Negative.  Negative for congestion, ear pain, rhinorrhea, sore throat and trouble swallowing.   Eyes: Negative.   Respiratory: Negative.  Negative for cough, chest tightness, shortness of breath and wheezing.   Cardiovascular: Negative.  Negative for chest pain.  Gastrointestinal: Negative.  Negative for abdominal pain, blood in stool, constipation, diarrhea, nausea and vomiting.  Endocrine: Negative.   Genitourinary:  Negative.  Negative for difficulty urinating, dysuria, frequency, hematuria and urgency.  Musculoskeletal: Negative.  Negative for arthralgias, back pain, joint swelling, myalgias and neck pain.  Skin: Negative.  Negative for rash and wound.  Allergic/Immunologic: Negative.  Negative for immunocompromised state.  Neurological: Negative.  Negative for dizziness, seizures, numbness and headaches.  Hematological: Negative.   Psychiatric/Behavioral: Negative.  Negative for behavioral problems, self-injury and suicidal ideas. The patient is not nervous/anxious.    Vital Signs: BP 115/80   Pulse 79   Temp 98.5 F (36.9 C)   Resp 16   Ht $R'5\' 1"'Qk$  (1.549 m)   Wt 147 lb (66.7 kg)   SpO2 97%   BMI 27.78 kg/m    Physical Exam Vitals reviewed.  Constitutional:      General: She is awake. She is not in acute distress.    Appearance: Normal appearance. She is well-developed, well-groomed and overweight. She is not ill-appearing or diaphoretic.  HENT:     Head: Normocephalic and atraumatic.     Right Ear: Tympanic membrane, ear canal and external ear normal.     Left Ear: Tympanic membrane, ear canal and external ear normal.     Nose: Nose normal.     Mouth/Throat:     Lips: Pink.     Mouth: Mucous membranes are moist.     Pharynx: Oropharynx is clear. Uvula midline. No oropharyngeal exudate or posterior oropharyngeal erythema.  Eyes:     General: Lids are normal. Vision grossly intact. Gaze aligned appropriately. No scleral icterus.       Right eye: No discharge.        Left eye: No discharge.     Extraocular Movements: Extraocular movements intact.     Conjunctiva/sclera: Conjunctivae normal.     Pupils: Pupils are equal, round, and reactive to light.     Funduscopic exam:    Right eye: Red reflex present.        Left eye: Red reflex present. Neck:     Thyroid: No thyromegaly.     Vascular: No JVD.     Trachea: Trachea and phonation normal. No tracheal deviation.  Cardiovascular:      Rate and Rhythm: Normal rate and regular rhythm.  Pulses: Normal pulses.     Heart sounds: Normal heart sounds, S1 normal and S2 normal. No murmur heard.   No friction rub. No gallop.  Pulmonary:     Effort: Pulmonary effort is normal. No accessory muscle usage or respiratory distress.     Breath sounds: Normal breath sounds and air entry. No stridor. No wheezing or rales.  Chest:     Chest wall: No tenderness.     Comments: Declined clinical breast exam.  Abdominal:     General: Bowel sounds are normal. There is no distension.     Palpations: Abdomen is soft. There is no mass.     Tenderness: There is no abdominal tenderness. There is no guarding or rebound.  Musculoskeletal:        General: No tenderness or deformity. Normal range of motion.     Cervical back: Normal range of motion and neck supple.  Lymphadenopathy:     Cervical: No cervical adenopathy.  Skin:    General: Skin is warm and dry.     Capillary Refill: Capillary refill takes less than 2 seconds.     Coloration: Skin is not pale.     Findings: No erythema or rash.  Neurological:     Mental Status: She is alert and oriented to person, place, and time.     Cranial Nerves: No cranial nerve deficit.     Motor: No abnormal muscle tone.     Coordination: Coordination normal.     Gait: Gait normal.     Deep Tendon Reflexes: Reflexes are normal and symmetric.  Psychiatric:        Mood and Affect: Mood and affect normal.        Behavior: Behavior normal. Behavior is cooperative.        Thought Content: Thought content normal.        Judgment: Judgment normal.       Assessment/Plan: 1. Encounter for routine adult health examination with abnormal findings Age-appropriate preventive screenings and vaccinations discussed, annual physical exam completed. Routine labs for health maintenance ordered, see below. PHM updated. No preventive screenings are due now, goes to OBGYN for women's health.   2. Chronic pain of  right wrist Has persistent chronic pain in right wrist, refer to orthopedic surgery.  - Ambulatory referral to Orthopedic Surgery  3. Impaired fasting glucose Routine lab ordered.  - CMP14+EGFR  4. Mixed hyperlipidemia Routine lab ordered. - Lipid Profile  5. Vitamin D deficiency Rule out low vitamin D - Vitamin D (25 hydroxy)  6. Anemia, unspecified type Routine lab ordered - CBC with Differential/Platelet  7. Dysuria Routine urinalysis done - UA/M w/rflx Culture, Routine - Microscopic Examination - Urine Culture, Reflex     General Counseling: Jaclyn Gray verbalizes understanding of the findings of todays visit and agrees with plan of treatment. I have discussed any further diagnostic evaluation that may be needed or ordered today. We also reviewed her medications today. she has been encouraged to call the office with any questions or concerns that should arise related to todays visit.    Orders Placed This Encounter  Procedures   Microscopic Examination   Urine Culture, Reflex   CBC with Differential/Platelet   CMP14+EGFR   Lipid Profile   Vitamin D (25 hydroxy)   UA/M w/rflx Culture, Routine   Ambulatory referral to Orthopedic Surgery    No orders of the defined types were placed in this encounter.   Return in about 1 year (around 10/12/2022) for CPE, Staley Budzinski  PCP.   Total time spent:30 Minutes Time spent includes review of chart, medications, test results, and follow up plan with the patient.   [place] Controlled Substance Database was reviewed by me.  This patient was seen by [name], FNP-C in collaboration with Dr. [name] as a part of collaborative care agreement.  Empress Newmann R. Valetta Fuller, MSN, FNP-C Internal medicine

## 2021-10-13 NOTE — Telephone Encounter (Signed)
Please check with AA

## 2021-10-16 LAB — UA/M W/RFLX CULTURE, ROUTINE
Bilirubin, UA: NEGATIVE
Glucose, UA: NEGATIVE
Ketones, UA: NEGATIVE
Nitrite, UA: NEGATIVE
Protein,UA: NEGATIVE
RBC, UA: NEGATIVE
Specific Gravity, UA: 1.028 (ref 1.005–1.030)
Urobilinogen, Ur: 1 mg/dL (ref 0.2–1.0)
pH, UA: 6 (ref 5.0–7.5)

## 2021-10-16 LAB — MICROSCOPIC EXAMINATION
Bacteria, UA: NONE SEEN
Casts: NONE SEEN /lpf
RBC, Urine: NONE SEEN /hpf (ref 0–2)

## 2021-10-16 LAB — URINE CULTURE, REFLEX

## 2021-10-18 ENCOUNTER — Telehealth: Payer: Self-pay

## 2021-10-18 NOTE — Telephone Encounter (Signed)
Waiting on office notes to be closed to manually fax orthopedic referral to Dr. Adam Phenix

## 2021-10-26 NOTE — Telephone Encounter (Signed)
Orthopedic referral manually faxed to Dr. Jackqulyn Livings w/ [facility] [phone]

## 2021-11-02 NOTE — Telephone Encounter (Signed)
Orthopedic appointment scheduled for 11/09/21 @ 10:00-Toni

## 2021-11-29 ENCOUNTER — Telehealth: Payer: Self-pay

## 2021-11-29 ENCOUNTER — Telehealth (INDEPENDENT_AMBULATORY_CARE_PROVIDER_SITE_OTHER): Admitting: Internal Medicine

## 2021-11-29 ENCOUNTER — Other Ambulatory Visit: Payer: Self-pay

## 2021-11-29 VITALS — BP 118/87 | HR 76 | Temp 97.3°F | Ht 61.0 in | Wt 147.0 lb

## 2021-11-29 DIAGNOSIS — J208 Acute bronchitis due to other specified organisms: Secondary | ICD-10-CM

## 2021-11-29 MED ORDER — AZITHROMYCIN 250 MG PO TABS: ORAL_TABLET | ORAL | 0 refills | Status: AC

## 2021-11-29 NOTE — Telephone Encounter (Signed)
Called patient to let her know I have her work note completed. She stated to just leave at front desk. She doesn't think she needs it-Toni

## 2021-11-29 NOTE — Progress Notes (Signed)
[facility] [place], [place] [address]  Internal MEDICINE  Telephone Visit  Patient Name: Jaclyn Gray  [id number]  [id number]  Date of Service: 12/29/2021  I connected with the patient at 1020 by telephone and verified the patients identity using two identifiers.   I discussed the limitations, risks, security and privacy concerns of performing an evaluation and management service by telephone and the availability of in person appointments. I also discussed with the patient that there may be a patient responsible charge related to the service.  The patient expressed understanding and agrees to proceed.    Chief Complaint  Patient presents with   Telephone Assessment    [id number]   Telephone Screen    Exposure to RSV and covid test is negative    Sinusitis   Nasal Congestion   Cough    Yellowish green     HPI Pt is connected via tele visit for acute and sick visit Cough and congestion Feels weak Sick x 3 days  Denies any fever or chills    Current Medication: Outpatient Encounter Medications as of 11/29/2021  Medication Sig   [EXPIRED] azithromycin (ZITHROMAX) 250 MG tablet Take 2 tablets on day 1, then 1 tablet daily on days 2 through 5   Prenatal Vit-Fe Fumarate-FA (MULTIVITAMIN-PRENATAL) 27-0.8 MG TABS tablet Take 1 tablet by mouth daily at 12 noon.   No facility-administered encounter medications on file as of 11/29/2021.    Surgical History: Past Surgical History:  Procedure Laterality Date   WISDOM TOOTH EXTRACTION     x4 extracted     Medical History: Past Medical History:  Diagnosis Date   ASCUS with positive high risk HPV cervical on 07/21/2020 07/29/2020   At risk for fertility problems 08/02/2019   Generalized anxiety disorder 05/24/2020   Gestational hypertension 02/10/2021   Low back pain 05/07/2017   Uterine leiomyoma 10/26/2020   At anatomy u/s 09/15/20:  Myomas  Site                     L(cm)      W(cm)      D(cm)        Location  Posterior                5.7        4.7        5.4         Intramural  Posterior                3.5        3.7        4           Intramural  Posterior                3.3        2.6        3.2         Intramural   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    Family History: Family History  Problem Relation Age of Onset   Hypertension Mother    Breast cancer Mother 64   Prostate cancer Father    Hypertension Father    Diabetes Father    Throat cancer Maternal Grandmother    Colon cancer Maternal Grandmother    Lung cancer Maternal Grandfather     Social History   Socioeconomic History   Marital status: Single    Spouse name: Not on  file   Number of children: Not on file   Years of education: Not on file   Highest education level: Not on file  Occupational History   Occupation: Nature conservation officer  Tobacco Use   Smoking status: Never   Smokeless tobacco: Never  Vaping Use   Vaping Use: Never used  Substance and Sexual Activity   Alcohol use: Not Currently    Comment: social    Drug use: Never   Sexual activity: Not Currently    Birth control/protection: None  Other Topics Concern   Not on file  Social History Narrative   Not on file   Social Determinants of Health   Financial Resource Strain: Not on file  Food Insecurity: Not on file  Transportation Needs: Not on file  Physical Activity: Not on file  Stress: Not on file  Social Connections: Not on file  Intimate Partner Violence: Not on file      Review of Systems  Constitutional:  Positive for fatigue. Negative for fever.  HENT:  Positive for postnasal drip. Negative for congestion and mouth sores.   Respiratory:  Negative for cough.   Cardiovascular:  Negative for chest pain.  Genitourinary:  Negative for flank pain.  Psychiatric/Behavioral: Negative.     Vital Signs: BP 118/87 Comment: 135/96  Pulse 76   Temp (!) 97.3 F (36.3 C)   Ht 5\' 1"  (1.549 m)   Wt 147 lb (66.7 kg)    BMI 27.78 kg/m    Observation/Objective:  Sounds congested but NAD   Assessment/Plan: 1. Acute bronchitis due to other specified organisms Pt is instructed to take Mucinex dm otc  - azithromycin (ZITHROMAX) 250 MG tablet; Take 2 tablets on day 1, then 1 tablet daily on days 2 through 5  Dispense: 6 tablet; Refill: 0   General Counseling: Jaclyn Gray verbalizes understanding of the findings of today's phone visit and agrees with plan of treatment. I have discussed any further diagnostic evaluation that may be needed or ordered today. We also reviewed her medications today. she has been encouraged to call the office with any questions or concerns that should arise related to todays visit.    No orders of the defined types were placed in this encounter.    Meds ordered this encounter  Medications   azithromycin (ZITHROMAX) 250 MG tablet    Sig: Take 2 tablets on day 1, then 1 tablet daily on days 2 through 5    Dispense:  6 tablet    Refill:  0     Time spent:15 Minutes    Dr Lavera Guise Internal medicine

## 2022-02-15 ENCOUNTER — Encounter: Payer: Self-pay | Admitting: Nurse Practitioner

## 2022-02-15 ENCOUNTER — Other Ambulatory Visit: Payer: Self-pay

## 2022-02-15 ENCOUNTER — Ambulatory Visit (INDEPENDENT_AMBULATORY_CARE_PROVIDER_SITE_OTHER): Admitting: Nurse Practitioner

## 2022-02-15 VITALS — BP 110/90 | HR 82 | Temp 98.0°F | Resp 16 | Ht 61.0 in | Wt 150.0 lb

## 2022-02-15 DIAGNOSIS — M79671 Pain in right foot: Secondary | ICD-10-CM

## 2022-02-15 DIAGNOSIS — M25551 Pain in right hip: Secondary | ICD-10-CM | POA: Diagnosis not present

## 2022-02-15 DIAGNOSIS — M25562 Pain in left knee: Secondary | ICD-10-CM | POA: Diagnosis not present

## 2022-02-15 DIAGNOSIS — R300 Dysuria: Secondary | ICD-10-CM

## 2022-02-15 DIAGNOSIS — N3000 Acute cystitis without hematuria: Secondary | ICD-10-CM | POA: Diagnosis not present

## 2022-02-15 DIAGNOSIS — M25552 Pain in left hip: Secondary | ICD-10-CM

## 2022-02-15 LAB — POCT URINALYSIS DIPSTICK
Bilirubin, UA: NEGATIVE
Blood, UA: NEGATIVE
Glucose, UA: NEGATIVE
Leukocytes, UA: NEGATIVE
Nitrite, UA: NEGATIVE
Protein, UA: POSITIVE — AB
Spec Grav, UA: 1.025 (ref 1.010–1.025)
Urobilinogen, UA: 0.2 E.U./dL
pH, UA: 5 (ref 5.0–8.0)

## 2022-02-15 MED ORDER — AMOXICILLIN-POT CLAVULANATE 875-125 MG PO TABS: 1.0000 | ORAL_TABLET | Freq: Two times a day (BID) | ORAL | 0 refills | Status: AC

## 2022-02-15 NOTE — Progress Notes (Unsigned)
[facility] [place], [place] [address]  Internal MEDICINE  Office Visit Note  Patient Name: Jaclyn Gray  [id number]  [id number]  Date of Service: 02/15/2022  Chief Complaint  Patient presents with   Acute Visit    Hip pain both sides, left knee pain    Urinary Frequency   Urinary Tract Infection   Knee Pain    HPI Jaclyn Gray presents for a follow-up visit for  Persistent cough,   Right foot pain from bunion on medial side of first metatarsal joint   Current Medication: Outpatient Encounter Medications as of 02/15/2022  Medication Sig   COVID-19 mRNA vaccine, Pfizer, 30 MCG/0.3ML injection    Prenatal Vit-Fe Fumarate-FA (MULTIVITAMIN-PRENATAL) 27-0.8 MG TABS tablet Take 1 tablet by mouth daily at 12 noon.   No facility-administered encounter medications on file as of 02/15/2022.    Surgical History: Past Surgical History:  Procedure Laterality Date   WISDOM TOOTH EXTRACTION     x4 extracted     Medical History: Past Medical History:  Diagnosis Date   ASCUS with positive high risk HPV cervical on 07/21/2020 07/29/2020   At risk for fertility problems 08/02/2019   Generalized anxiety disorder 05/24/2020   Gestational hypertension 02/10/2021   Low back pain 05/07/2017   Uterine leiomyoma 10/26/2020   At anatomy u/s 09/15/20:  Myomas  Site                     L(cm)      W(cm)      D(cm)       Location  Posterior                5.7        4.7        5.4         Intramural  Posterior                3.5        3.7        4           Intramural  Posterior                3.3        2.6        3.2         Intramural   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    Family History: Family History  Problem Relation Age of Onset   Hypertension Mother    Breast cancer Mother 40   Prostate cancer Father    Hypertension Father    Diabetes Father    Throat cancer Maternal Grandmother    Colon cancer Maternal Grandmother    Lung  cancer Maternal Grandfather     Social History   Socioeconomic History   Marital status: Single    Spouse name: Not on file   Number of children: Not on file   Years of education: Not on file   Highest education level: Not on file  Occupational History   Occupation: Nature conservation officer  Tobacco Use   Smoking status: Never   Smokeless tobacco: Never  Vaping Use   Vaping Use: Never used  Substance and Sexual Activity   Alcohol use: Not Currently    Comment: social    Drug use: Never   Sexual activity: Not Currently    Birth control/protection: None  Other Topics Concern   Not on file  Social History Narrative  Not on file   Social Determinants of Health   Financial Resource Strain: Not on file  Food Insecurity: Not on file  Transportation Needs: Not on file  Physical Activity: Not on file  Stress: Not on file  Social Connections: Not on file  Intimate Partner Violence: Not on file      Review of Systems  Vital Signs: BP 110/90    Pulse 82    Temp 98 F (36.7 C)    Resp 16    Ht 5\' 1"  (1.549 m)    Wt 150 lb (68 kg)    SpO2 98%    BMI 28.34 kg/m    Physical Exam     Assessment/Plan:   General Counseling: Jaclyn Gray verbalizes understanding of the findings of todays visit and agrees with plan of treatment. I have discussed any further diagnostic evaluation that may be needed or ordered today. We also reviewed her medications today. she has been encouraged to call the office with any questions or concerns that should arise related to todays visit.    Orders Placed This Encounter  Procedures   POCT Urinalysis Dipstick    No orders of the defined types were placed in this encounter.   No follow-ups on file.   Total time spent:*** Minutes Time spent includes review of chart, medications, test results, and follow up plan with the patient.   [place] Controlled Substance Database was reviewed by me.  This patient was seen by [name], FNP-C in collaboration  with Dr. [name] as a part of collaborative care agreement.   Danille Oppedisano R. Valetta Fuller, MSN, FNP-C Internal medicine

## 2022-02-16 ENCOUNTER — Telehealth: Payer: Self-pay

## 2022-02-16 NOTE — Telephone Encounter (Signed)
Awaiting 02/15/22 office notes for ortho surgery referral-Toni

## 2022-02-20 ENCOUNTER — Other Ambulatory Visit: Payer: Self-pay

## 2022-02-20 ENCOUNTER — Encounter: Payer: Self-pay | Admitting: Podiatry

## 2022-02-20 ENCOUNTER — Ambulatory Visit (INDEPENDENT_AMBULATORY_CARE_PROVIDER_SITE_OTHER): Admitting: Podiatry

## 2022-02-20 ENCOUNTER — Ambulatory Visit (INDEPENDENT_AMBULATORY_CARE_PROVIDER_SITE_OTHER)

## 2022-02-20 DIAGNOSIS — M778 Other enthesopathies, not elsewhere classified: Secondary | ICD-10-CM

## 2022-02-20 DIAGNOSIS — Z309 Encounter for contraceptive management, unspecified: Secondary | ICD-10-CM | POA: Insufficient documentation

## 2022-02-20 DIAGNOSIS — Z124 Encounter for screening for malignant neoplasm of cervix: Secondary | ICD-10-CM | POA: Insufficient documentation

## 2022-02-20 DIAGNOSIS — Z0110 Encounter for examination of ears and hearing without abnormal findings: Secondary | ICD-10-CM | POA: Insufficient documentation

## 2022-02-20 DIAGNOSIS — M205X1 Other deformities of toe(s) (acquired), right foot: Secondary | ICD-10-CM

## 2022-02-20 DIAGNOSIS — M2011 Hallux valgus (acquired), right foot: Secondary | ICD-10-CM

## 2022-02-20 DIAGNOSIS — G4489 Other headache syndrome: Secondary | ICD-10-CM | POA: Insufficient documentation

## 2022-02-20 DIAGNOSIS — H52209 Unspecified astigmatism, unspecified eye: Secondary | ICD-10-CM | POA: Insufficient documentation

## 2022-02-20 DIAGNOSIS — M25569 Pain in unspecified knee: Secondary | ICD-10-CM | POA: Insufficient documentation

## 2022-02-20 DIAGNOSIS — S336XXA Sprain of sacroiliac joint, initial encounter: Secondary | ICD-10-CM | POA: Insufficient documentation

## 2022-02-20 NOTE — Progress Notes (Signed)
Subjective:  Patient ID: Jaclyn Gray, female    DOB: 11-25-1981,  MRN: [id number] HPI Chief Complaint  Patient presents with   Foot Pain    1st MPJ right - bunion deformity, 2nd toe right - mallet toe - aching and shoes aggravate   New Patient (Initial Visit)    41 y.o. female presents with the above complaint.   ROS: Denies fever chills nausea vomiting muscle aches pains calf pain back pain chest pain shortness of breath.  Past Medical History:  Diagnosis Date   ASCUS with positive high risk HPV cervical on 07/21/2020 07/29/2020   At risk for fertility problems 08/02/2019   Generalized anxiety disorder 05/24/2020   Gestational hypertension 02/10/2021   Low back pain 05/07/2017   Uterine leiomyoma 10/26/2020   At anatomy u/s 09/15/20:  Myomas  Site                     L(cm)      W(cm)      D(cm)       Location  Posterior                5.7        4.7        5.4         Intramural  Posterior                3.5        3.7        4           Intramural  Posterior                3.3        2.6        3.2         Intramural   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  General: Well developed, nourished, in no acute distress, alert and oriented x3   Dermatological: Skin is warm, dry and supple bilateral. Nails x 10 are well maintained; remaining integument appears unremarkable at this  time. There are no open sores, no preulcerative lesions, no rash or signs of infection present.  Vascular: Dorsalis Pedis artery and Posterior Tibial artery pedal pulses are 2/4 bilateral with immedate capillary fill time. Pedal hair growth present. No varicosities and no lower extremity edema present bilateral.   Neruologic: Grossly intact via light touch bilateral. Vibratory intact via tuning fork bilateral. Protective threshold with Semmes Wienstein monofilament intact to all pedal sites bilateral. Patellar and Achilles deep tendon reflexes 2+ bilateral. No Babinski or clonus noted bilateral.   Musculoskeletal: No gross boney pedal deformities bilateral. No pain, crepitus, or limitation noted with foot and ankle range of motion bilateral. Muscular strength 5/5 in all groups tested bilateral.  Hallux abductovalgus deformity of the right foot is reproducible and reducible she  has good range of motion without crepitation.  She does have a mallet toe deformity.  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  Gait: Unassisted, Nonantalgic.    Radiographs:  Radiographs taken today demonstrate an osseously mature individual with an increase in the first intermetatarsal angle greater than 12 degrees hallux abductus angle greater than 12 degrees and hallux interphalangeal angle greater than 8 degrees.  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  We did discuss pros and cons of this and the fact that she would need to be out of work for probably about 3 months.  She understands that and is amendable to it.  I provided her with information regarding the surgery center and anesthesia  group we did discuss the possible postop complications which may include but not limited to postop pain bleeding swelling infection recurrence need for further surgery overcorrection under correction loss of digit loss of limb loss of life.     Taylah Dubiel T. [place], [place]

## 2022-02-21 LAB — CBC WITH DIFFERENTIAL/PLATELET
Basophils Absolute: 0 10*3/uL (ref 0.0–0.2)
Basos: 1 %
EOS (ABSOLUTE): 0.1 10*3/uL (ref 0.0–0.4)
Eos: 1 %
Hematocrit: 42 % (ref 34.0–46.6)
Hemoglobin: 14.7 g/dL (ref 11.1–15.9)
Immature Grans (Abs): 0 10*3/uL (ref 0.0–0.1)
Immature Granulocytes: 0 %
Lymphocytes Absolute: 2.2 10*3/uL (ref 0.7–3.1)
Lymphs: 31 %
MCH: 32.1 pg (ref 26.6–33.0)
MCHC: 35 g/dL (ref 31.5–35.7)
MCV: 92 fL (ref 79–97)
Monocytes Absolute: 0.6 10*3/uL (ref 0.1–0.9)
Monocytes: 9 %
Neutrophils Absolute: 4.1 10*3/uL (ref 1.4–7.0)
Neutrophils: 58 %
Platelets: 333 10*3/uL (ref 150–450)
RBC: 4.58 x10E6/uL (ref 3.77–5.28)
RDW: 11.8 % (ref 11.7–15.4)
WBC: 7.1 10*3/uL (ref 3.4–10.8)

## 2022-02-21 LAB — CMP14+EGFR
ALT: 16 IU/L (ref 0–32)
AST: 23 IU/L (ref 0–40)
Albumin/Globulin Ratio: 1.5 (ref 1.2–2.2)
Albumin: 4.5 g/dL (ref 3.8–4.8)
Alkaline Phosphatase: 72 IU/L (ref 44–121)
BUN/Creatinine Ratio: 11 (ref 9–23)
BUN: 10 mg/dL (ref 6–20)
Bilirubin Total: 0.3 mg/dL (ref 0.0–1.2)
CO2: 24 mmol/L (ref 20–29)
Calcium: 9.7 mg/dL (ref 8.7–10.2)
Chloride: 103 mmol/L (ref 96–106)
Creatinine, Ser: 0.91 mg/dL (ref 0.57–1.00)
Globulin, Total: 3.1 g/dL (ref 1.5–4.5)
Glucose: 82 mg/dL (ref 70–99)
Potassium: 4.4 mmol/L (ref 3.5–5.2)
Sodium: 140 mmol/L (ref 134–144)
Total Protein: 7.6 g/dL (ref 6.0–8.5)
eGFR: 82 mL/min/{1.73_m2} (ref >59)

## 2022-02-21 LAB — LIPID PANEL
Chol/HDL Ratio: 2.6 ratio (ref 0.0–4.4)
Cholesterol, Total: 219 mg/dL — ABNORMAL HIGH (ref 100–199)
HDL: 84 mg/dL (ref >39)
LDL Chol Calc (NIH): 126 mg/dL — ABNORMAL HIGH (ref 0–99)
Triglycerides: 52 mg/dL (ref 0–149)
VLDL Cholesterol Cal: 9 mg/dL (ref 5–40)

## 2022-02-21 LAB — VITAMIN D 25 HYDROXY (VIT D DEFICIENCY, FRACTURES): Vit D, 25-Hydroxy: 27 ng/mL — ABNORMAL LOW (ref 30.0–100.0)

## 2022-02-22 NOTE — Telephone Encounter (Signed)
Sent via proficient to [facility] ?

## 2022-02-24 ENCOUNTER — Telehealth: Payer: Self-pay

## 2022-02-24 NOTE — Telephone Encounter (Signed)
-----   Message from [name], NP sent at 02/24/2022  8:27 AM EST ----- ?Please call the patient with her results: ?-- Cholesterol levels are slightly abnormal.  Total cholesterol is slightly elevated at 219 and LDL is 126.  Triglycerides, HDL and VLDL are normal.  Recommend that patient limit red meat intake and increase his lean proteins in her diet such as chicken, [place], and fish.  An over-the-counter fish oil supplement is also a proactive intervention and should be 1000 mg daily. ?--Vitamin D is slightly low at 27.0.  Recommend over-the-counter vitamin D supplement of 2000 units daily if not already doing so. ?--CBC is normal, no anemia. ?--Metabolic panel is normal, liver and kidney function are normal. ?

## 2022-02-24 NOTE — Telephone Encounter (Signed)
Unable to leave VM, pt needs to know labs ?

## 2022-02-24 NOTE — Progress Notes (Signed)
Please call the patient with her results: -- Cholesterol levels are slightly abnormal.  Total cholesterol is slightly elevated at 219 and LDL is 126.  Triglycerides, HDL and VLDL are normal.  Recommend that patient limit red meat intake and increase his lean proteins in her diet such as chicken, [place], and fish.  An over-the-counter fish oil supplement is also a proactive intervention and should be 1000 mg daily. --Vitamin D is slightly low at 27.0.  Recommend over-the-counter vitamin D supplement of 2000 units daily if not already doing so. --CBC is normal, no anemia. --Metabolic panel is normal, liver and kidney function are normal.

## 2022-03-03 ENCOUNTER — Telehealth: Payer: Self-pay

## 2022-03-03 NOTE — Telephone Encounter (Signed)
Per Alyssa labs do not need to be retaken, they will be done yearly. Non fasting did not have a dramatic effect on anything. Pt aware.  ?

## 2022-03-07 ENCOUNTER — Telehealth: Payer: Self-pay

## 2022-03-07 NOTE — Telephone Encounter (Signed)
Referral approval request manually faxed to [facility] [phone] ?

## 2022-03-11 ENCOUNTER — Encounter: Payer: Self-pay | Admitting: Radiology

## 2022-04-13 ENCOUNTER — Encounter: Payer: Self-pay | Admitting: Podiatry

## 2022-04-19 ENCOUNTER — Other Ambulatory Visit: Payer: Self-pay | Admitting: Podiatry

## 2022-04-19 MED ORDER — ONDANSETRON HCL 4 MG PO TABS: 4.0000 mg | ORAL_TABLET | Freq: Three times a day (TID) | ORAL | 0 refills | Status: DC | PRN

## 2022-04-19 MED ORDER — OXYCODONE-ACETAMINOPHEN 10-325 MG PO TABS: 1.0000 | ORAL_TABLET | Freq: Three times a day (TID) | ORAL | 0 refills | Status: AC | PRN

## 2022-04-19 MED ORDER — CEPHALEXIN 500 MG PO CAPS: 500.0000 mg | ORAL_CAPSULE | Freq: Three times a day (TID) | ORAL | 0 refills | Status: DC

## 2022-04-21 DIAGNOSIS — M2011 Hallux valgus (acquired), right foot: Secondary | ICD-10-CM | POA: Diagnosis not present

## 2022-04-21 DIAGNOSIS — M2041 Other hammer toe(s) (acquired), right foot: Secondary | ICD-10-CM | POA: Diagnosis not present

## 2022-04-26 ENCOUNTER — Ambulatory Visit (INDEPENDENT_AMBULATORY_CARE_PROVIDER_SITE_OTHER): Admitting: Podiatry

## 2022-04-26 ENCOUNTER — Ambulatory Visit (INDEPENDENT_AMBULATORY_CARE_PROVIDER_SITE_OTHER)

## 2022-04-26 DIAGNOSIS — M2011 Hallux valgus (acquired), right foot: Secondary | ICD-10-CM

## 2022-04-26 DIAGNOSIS — M205X1 Other deformities of toe(s) (acquired), right foot: Secondary | ICD-10-CM

## 2022-04-27 ENCOUNTER — Telehealth: Payer: Self-pay | Admitting: *Deleted

## 2022-04-27 ENCOUNTER — Encounter: Payer: Self-pay | Admitting: Podiatry

## 2022-04-27 NOTE — Telephone Encounter (Signed)
Yes date it from date of surgery 04/21/22 and plan for 3 months before able to return to work without restriction. ?

## 2022-04-27 NOTE — Telephone Encounter (Signed)
Letter for patient , will pick up at front desk

## 2022-04-27 NOTE — Telephone Encounter (Signed)
Patient is calling to request a note for her job stating that she is unable to return back to work due to previous foot surgery and what her restrictions are.will pick up note at office when completed.Please advise. ?

## 2022-05-01 NOTE — Progress Notes (Signed)
?  Subjective:  ?Patient ID: Jaclyn Gray, female    DOB: [birth year],  MRN: [id number] ? ?Chief Complaint  ?Patient presents with  ? Routine Post Op  ?   (xray)POV #1 DOS 04/21/2022 RT FOOT [place] BUNIONECTOMY, MALLET TOE REPAIR 2ND RT/DR HYATT PT  ? ? ? ?41 y.o. female returns for post-op check.  Doing well she is not having much pain ? ?Review of Systems: Negative except as noted in the HPI. Denies N/V/F/Ch. ? ? ?Objective:  ?There were no vitals filed for this visit. ?There is no height or weight on file to calculate BMI. ?Constitutional Well developed. ?Well nourished.  ?Vascular Foot warm and well perfused. ?Capillary refill normal to all digits.  Calf is soft and supple, no posterior calf or knee pain, negative Homans' sign  ?Neurologic Normal speech. ?Oriented to person, place, and time. ?Epicritic sensation to light touch grossly present bilaterally.  ?Dermatologic Skin healing well without signs of infection. Skin edges well coapted without signs of infection.  ?Orthopedic: Tenderness to palpation noted about the surgical site.  ? ?Multiple view plain film radiographs: Status post distal metatarsal osteotomy for bunionectomy with good correction screw intact in position ?Assessment:  ? ?1. Hallux valgus of right foot   ?2. Mallet toe of right foot   ? ?Plan:  ?Patient was evaluated and treated and all questions answered. ? ?S/p foot surgery right ?-Progressing as expected post-operatively. ?-XR: Noted above ?-WB Status: WBAT in surgical shoe ?-Sutures: Plan to remove in 2 weeks. ?-Medications: No refills required ?-Foot redressed. ? ?Return in about 2 weeks (around 05/10/2022) for post op (no x-rays), suture removal.  ?

## 2022-05-02 ENCOUNTER — Telehealth: Payer: Self-pay | Admitting: Podiatry

## 2022-05-02 NOTE — Telephone Encounter (Signed)
Patient called to get an updated status on the [employer] profile packet that I gave to [place]. Please advise? ?

## 2022-05-03 ENCOUNTER — Telehealth: Payer: Self-pay | Admitting: *Deleted

## 2022-05-03 NOTE — Telephone Encounter (Signed)
"  Just calling for a follow-up.  I apologize for all the messages.  Could I get a call back?  I would appreciate it." ?

## 2022-05-04 ENCOUNTER — Telehealth: Payer: Self-pay | Admitting: Podiatry

## 2022-05-04 NOTE — Telephone Encounter (Signed)
This is the email I received this morning from Mrs. Hulick. ? ?Happy Friday Eve! I hope this message finds you well. I apologies about all the annoying calls and voicemails. [employer] has provided me guidance (05 MAY) and given me until 12 May to have this task complete. Unfortunately for me I also have to send this to [employer] for processing once Dr. Milinda Pointer or his nurses portion is complete. If you can provide me any guidance on it's completion it will be much appreciated. Very Respectfully  ? ?Areas of Interest: ?-SUMMARY OF CARE BY NON-MILITARY MEDICAL PROVIDER (PG 6 - 7 Section 2, 5, and Provider Information and Signature) ? ? ?-Functional Capability Form - Designer, industrial/product (ACFT) 2.0 (PG 9) ? ? ?if there are any questions or concerns regarding this please do not hesitate to give me a call. V/R ? ?  ? ?  ? ?Thank You, ? ?Laren Everts ([employer]) ? ?[place] ? ?[email or web address] ? [email or web address] ? ?[phone] ? ? ?

## 2022-05-04 NOTE — Telephone Encounter (Signed)
Jaclyn Gray - can you let this patient know that Dr. Milinda Pointer has been out of the office and he will be working on this and get it to her asap. ?

## 2022-05-04 NOTE — Telephone Encounter (Signed)
Dr. Milinda Pointer will work on this and let her know he's been out of the office... do you mind calling her and letting her know? ?

## 2022-05-10 ENCOUNTER — Ambulatory Visit (INDEPENDENT_AMBULATORY_CARE_PROVIDER_SITE_OTHER): Admitting: Podiatry

## 2022-05-10 DIAGNOSIS — Z9889 Other specified postprocedural states: Secondary | ICD-10-CM

## 2022-05-10 DIAGNOSIS — M205X1 Other deformities of toe(s) (acquired), right foot: Secondary | ICD-10-CM

## 2022-05-10 DIAGNOSIS — M2011 Hallux valgus (acquired), right foot: Secondary | ICD-10-CM

## 2022-05-10 NOTE — Telephone Encounter (Signed)
Patient saw Dr. Milinda Pointer today, 05/10/2022. ?

## 2022-05-10 NOTE — Progress Notes (Signed)
Jaclyn Gray presents today for follow-up of her [place] bunionectomy and mallet toe repair second digit right foot.  She states that she continues to use her knee walker in her cam boot.  She states that when she stepped down on the foot it was considerably painful. ? ?Objective: Vital signs are stable she is alert and oriented x3.  Pulses are palpable.  Dry sterile dressing intact once removed demonstrates dry tight skin around the first metatarsal phalangeal joint and normal tissue around the second toe both of which appear to be healing very nicely there is nice and rectus she does have tenderness on attempted range of motion of the first metatarsal phalangeal joint right foot. ? ?Assessment: Well-healing surgical foot. ? ?Plan: Encouraged range of motion exercises I remove sutures to the second toe demonstrated to her how to dress the toe on a regular basis she understands and is amenable to it.  I will follow-up with her in a couple of weeks to make sure she is doing well. ?

## 2022-05-18 ENCOUNTER — Other Ambulatory Visit (HOSPITAL_COMMUNITY)
Admission: RE | Admit: 2022-05-18 | Discharge: 2022-05-18 | Disposition: A | Discharge status: Home / Self Care | Source: Ambulatory Visit | Attending: Obstetrics & Gynecology | Admitting: Obstetrics & Gynecology

## 2022-05-18 ENCOUNTER — Ambulatory Visit (INDEPENDENT_AMBULATORY_CARE_PROVIDER_SITE_OTHER): Admitting: Obstetrics & Gynecology

## 2022-05-18 ENCOUNTER — Encounter: Payer: Self-pay | Admitting: Obstetrics & Gynecology

## 2022-05-18 VITALS — BP 119/82 | HR 79 | Ht 61.0 in | Wt 145.4 lb

## 2022-05-18 DIAGNOSIS — Z113 Encounter for screening for infections with a predominantly sexual mode of transmission: Secondary | ICD-10-CM | POA: Insufficient documentation

## 2022-05-18 DIAGNOSIS — R87810 Cervical high risk human papillomavirus (HPV) DNA test positive: Secondary | ICD-10-CM | POA: Diagnosis present

## 2022-05-18 DIAGNOSIS — Z1231 Encounter for screening mammogram for malignant neoplasm of breast: Secondary | ICD-10-CM

## 2022-05-18 DIAGNOSIS — F419 Anxiety disorder, unspecified: Secondary | ICD-10-CM | POA: Diagnosis not present

## 2022-05-18 DIAGNOSIS — N898 Other specified noninflammatory disorders of vagina: Secondary | ICD-10-CM

## 2022-05-18 DIAGNOSIS — Z01419 Encounter for gynecological examination (general) (routine) without abnormal findings: Secondary | ICD-10-CM | POA: Insufficient documentation

## 2022-05-18 DIAGNOSIS — R87610 Atypical squamous cells of undetermined significance on cytologic smear of cervix (ASC-US): Secondary | ICD-10-CM | POA: Diagnosis present

## 2022-05-18 NOTE — Progress Notes (Signed)
GYNECOLOGY ANNUAL PREVENTATIVE CARE ENCOUNTER NOTE  History:     Jaclyn Gray is a 41 y.o. G1P1001 female here for a routine annual gynecologic exam.  Current complaints: yellow, malodorous vaginal discharge for a few weeks. No irritation.   Denies abnormal vaginal bleeding, discharge, pelvic pain, problems with intercourse or other gynecologic concerns No periods yet as currently breastfeeding.  Desires STI screen.    Gynecologic History No LMP recorded. Contraception: none Last Pap: 4/11/20233. Result was normal with negative HPV. ASCUS +HRHPV in 07/21/2020. Last Mammogram: 08/07/2019.  Result was normal (was diagnostic due to 46m right breast benign cyst)  Obstetric History OB History  Gravida Para Term Preterm AB Living  '1 1 1 '$ 0 0 1  SAB IAB Ectopic Multiple Live Births  0 0 0 0 1    # Outcome Date GA Lbr Len/2nd Weight Sex Delivery Anes PTL Lv  1 Term 02/09/21 [id number]         Past Medical History:  Diagnosis Date   ASCUS with positive high risk HPV cervical on 07/21/2020 07/29/2020   At risk for fertility problems 08/02/2019   Generalized anxiety disorder 05/24/2020   Gestational hypertension 02/10/2021   Low back pain 05/07/2017   Uterine leiomyoma 10/26/2020   At anatomy u/s 09/15/20:  Myomas  Site                     L(cm)      W(cm)      D(cm)       Location  Posterior                5.7        4.7        5.4         Intramural  Posterior                3.5        3.7        4           Intramural  Posterior                3.3        2.6        3.2         Intramural   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 30 tablet 6   cephALEXin  (KEFLEX) 500 MG capsule Take 1 capsule (500 mg total) by mouth 3 (three) times daily. 30 capsule 0   COVID-19 mRNA vaccine, Pfizer, 30 MCG/0.3ML injection      ondansetron (ZOFRAN) 4 MG tablet Take 1 tablet (4 mg total) by mouth every 8 (eight) hours as needed. 20 tablet 0   No current facility-administered medications on file prior to visit.    No Known Allergies  Social History:  reports that she has never smoked. She has never been exposed to tobacco smoke. She has never used smokeless tobacco. She reports that she does not currently use alcohol. She reports that she does not use drugs.  Family History  Problem Relation Age of Onset   Hypertension Mother  Breast cancer Mother 14   Prostate cancer Father    Hypertension Father    Diabetes Father    Throat cancer Maternal Grandmother    Colon cancer Maternal Grandmother    Lung cancer Maternal Grandfather     The following portions of the patient's history were reviewed and updated as appropriate: allergies, current medications, past family history, past medical history, past social history, past surgical history and problem list.  Review of Systems Pertinent items noted in HPI and remainder of comprehensive ROS otherwise negative.  Physical Exam:  BP 119/82   Pulse 79   Ht '5\' 1"'$  (1.549 m)   Wt 145 lb 6.4 oz (66 kg)   Breastfeeding Yes   BMI 27.47 kg/m  CONSTITUTIONAL: Well-developed, well-nourished female in no acute distress.  HENT:  Normocephalic, atraumatic, External right and left ear normal.  EYES: Conjunctivae and EOM are normal. Pupils are equal, round, and reactive to light. No scleral icterus.  NECK: Normal range of motion, supple, no masses.  Normal thyroid.  SKIN: Skin is warm and dry. No rash noted. Not diaphoretic. No erythema. No pallor. MUSCULOSKELETAL: Normal range of motion. No tenderness.  No cyanosis, clubbing, or edema. NEUROLOGIC: Alert and oriented to person, place, and time. Normal reflexes, muscle  tone coordination.  PSYCHIATRIC: Normal mood and affect. Normal behavior. Normal judgment and thought content. CARDIOVASCULAR: Normal heart rate noted, regular rhythm RESPIRATORY: Clear to auscultation bilaterally. Effort and breath sounds normal, no problems with respiration noted. BREASTS: Symmetric in size. No masses, tenderness, skin changes, nipple drainage, or lymphadenopathy bilaterally. Performed in the presence of a chaperone. ABDOMEN: Soft, no distention noted.  No tenderness, rebound or guarding.  PELVIC: Normal appearing external genitalia and urethral meatus; normal appearing vaginal mucosa and cervix.  White vaginal discharge noted, testing sample obtained.  Pap smear obtained.  Normal uterine size, no other palpable masses, no uterine or adnexal tenderness.  Performed in the presence of a chaperone.   Assessment and Plan:     1. Routine screening for STI (sexually transmitted infection) STI screen done, will follow up results and manage accordingly. - Cervicovaginal ancillary only( [place])  2. Vaginal discharge - Cervicovaginal ancillary only( [name]) done, will follow up results and manage accordingly.  3. Anxiety GAD score 5 but she requested referral to [facility], this was done.  - Amb ref to Integrated Behavioral Health  4. Breast cancer screening by mammogram Mammogram to be scheduled by patient once she stops breastfeeding - MM 3D SCREEN BREAST BILATERAL; Future  5. ASCUS with positive high risk HPV cervical on 07/21/2020 6. Well woman exam with routine gynecological exam - Cytology - PAP Will follow up results of pap smear and manage accordingly. If normal, can resume paps every 3-5 years.  Routine preventative health maintenance measures emphasized. Please refer to After Visit Summary for other counseling recommendations.      Verita Schneiders, MD, [place] for [employer], [place]

## 2022-05-18 NOTE — Progress Notes (Signed)
Pt presents for annual reports "yellowish" malodorous vaginal discharge and requests STD testing. 04/04/2021 - Negative pap 07/21/2020 - HPV HR ASCUS  PHQ9=0 GAD7=5

## 2022-05-23 ENCOUNTER — Ambulatory Visit (INDEPENDENT_AMBULATORY_CARE_PROVIDER_SITE_OTHER): Admitting: Licensed Clinical Social Worker

## 2022-05-23 DIAGNOSIS — F411 Generalized anxiety disorder: Secondary | ICD-10-CM

## 2022-05-23 DIAGNOSIS — Z566 Other physical and mental strain related to work: Secondary | ICD-10-CM | POA: Diagnosis not present

## 2022-05-23 LAB — CYTOLOGY - PAP
Comment: NEGATIVE
Diagnosis: UNDETERMINED — AB
High risk HPV: NEGATIVE

## 2022-05-23 LAB — CERVICOVAGINAL ANCILLARY ONLY
Bacterial Vaginitis (gardnerella): NEGATIVE
Candida Glabrata: NEGATIVE
Candida Vaginitis: NEGATIVE
Chlamydia: NEGATIVE
Comment: NEGATIVE
Comment: NEGATIVE
Comment: NEGATIVE
Comment: NEGATIVE
Comment: NEGATIVE
Comment: NORMAL
Neisseria Gonorrhea: NEGATIVE
Trichomonas: NEGATIVE

## 2022-05-24 ENCOUNTER — Encounter: Payer: Self-pay | Admitting: Podiatry

## 2022-05-24 NOTE — BH Specialist Note (Signed)
Integrated Behavioral Health via Telemedicine Visit  05/24/2022 Jaclyn Gray [id number]  Number of [place] Clinician visits: 1 Session Start time: 100pm   Session End time: 146pm Total time in minutes: 46 mins mychart video   Referring Provider: Dr. Harolyn Rutherford Patient/Family location: Home  [facility] Provider location: [place]   All persons participating in visit: Pt C Few and LCSW A. Analissa Bayless  Types of Service: Individual Psychotherapy and Video Visit  I connected with Jaclyn Gray and/or Jaclyn Gray's n/a via  Telephone or Video Enabled Telemedicine Application  (Video is Caregility application) and verified that I am speaking with the correct person using two identifiers. Discussed confidentiality: Yes   I discussed the limitations of telemedicine and the availability of in person appointments.  Discussed there is a possibility of technology failure and discussed alternative modes of communication if that failure occurs.  I discussed that engaging in this telemedicine visit, they consent to the provision of behavioral healthcare and the services will be billed under their insurance.  Patient and/or legal guardian expressed understanding and consented to Telemedicine visit: Yes   Presenting Concerns: Patient and/or family reports the following symptoms/concerns: General anxiety disorder and stress at work  Duration of problem: over one year ; Severity of problem: mild  Patient and/or Family's Strengths/Protective Factors: Concrete supports in place (healthy food, safe environments, etc.)  Goals Addressed: Patient will:  Reduce symptoms of: anxiety   Increase knowledge and/or ability of: coping skills and stress reduction   Demonstrate ability to: Increase healthy adjustment to current life circumstances and Increase adequate support systems for patient/family  Progress towards Goals: Ongoing  Interventions: Interventions utilized:   Supportive Counseling Standardized Assessments completed: PHQ 9  Patient and/or Family Response: Ms. Pethtel responded well to visit   Assessment: Patient currently experiencing general anxiety disorder .   Patient may benefit from community outpatient behavioral health .  Plan: Follow up with behavioral health clinician on : as needed  Behavioral recommendations: Restart services with SEL group for counseling, consulting with commanding officer regarding stress at work, prioritze rest and set up support with friend and family for additional assistance  Referral(s): Armed forces logistics/support/administrative officer (LME/Outside Clinic)  I discussed the assessment and treatment plan with the patient and/or parent/guardian. They were provided an opportunity to ask questions and all were answered. They agreed with the plan and demonstrated an understanding of the instructions.   They were advised to call back or seek an in-person evaluation if the symptoms worsen or if the condition fails to improve as anticipated.  Lynnea Ferrier, LCSW

## 2022-05-29 ENCOUNTER — Ambulatory Visit (INDEPENDENT_AMBULATORY_CARE_PROVIDER_SITE_OTHER): Admitting: Podiatry

## 2022-05-29 ENCOUNTER — Encounter: Payer: Self-pay | Admitting: Podiatry

## 2022-05-29 ENCOUNTER — Ambulatory Visit (INDEPENDENT_AMBULATORY_CARE_PROVIDER_SITE_OTHER)

## 2022-05-29 DIAGNOSIS — M2011 Hallux valgus (acquired), right foot: Secondary | ICD-10-CM

## 2022-05-29 DIAGNOSIS — M205X1 Other deformities of toe(s) (acquired), right foot: Secondary | ICD-10-CM

## 2022-05-29 DIAGNOSIS — Z9889 Other specified postprocedural states: Secondary | ICD-10-CM

## 2022-05-29 NOTE — Progress Notes (Signed)
She presents today for postop visit date of surgery 04/21/2022 status post [facility] bunionectomy and mallet toe repair.  She states that looks like the wound opened up by been using the Medihoney and everything seems to be getting better.  I am ready to get this foot washed.  Objective: Vital signs are stable she is alert and oriented x3 presents with a light dressing on today in her cam boot.  The right foot demonstrates sutures are intact margins are actually well coapted what was split open was actually dry skin that had bled underneath.  That was removed today.  Her incision site for her mallet toe repair is healing very nicely as well.  Radiographs taken today demonstrate a well-healing first metatarsal osteotomy.  Assessment well-healing surgical foot.  Plan: Follow-up with me in 1 month placed her Darco shoe and compression garment and allow her to start doing this wet tomorrow.

## 2022-05-30 ENCOUNTER — Encounter: Payer: Self-pay | Admitting: Podiatry

## 2022-05-31 ENCOUNTER — Encounter: Payer: Self-pay | Admitting: Podiatry

## 2022-06-12 ENCOUNTER — Encounter: Payer: Self-pay | Admitting: Podiatry

## 2022-06-12 ENCOUNTER — Ambulatory Visit (INDEPENDENT_AMBULATORY_CARE_PROVIDER_SITE_OTHER): Admitting: Podiatry

## 2022-06-12 ENCOUNTER — Encounter: Admitting: Podiatry

## 2022-06-12 DIAGNOSIS — M205X1 Other deformities of toe(s) (acquired), right foot: Secondary | ICD-10-CM

## 2022-06-12 DIAGNOSIS — Z9889 Other specified postprocedural states: Secondary | ICD-10-CM

## 2022-06-12 DIAGNOSIS — M2011 Hallux valgus (acquired), right foot: Secondary | ICD-10-CM

## 2022-06-12 NOTE — Progress Notes (Signed)
Jaclyn Gray presents today date of surgery 04/21/2022 [facility] bunionectomy right foot and mallet toe second digit right foot.  She states that second toe is doing really well still stiffness occurring over the first metatarsophalangeal joint she says she has been working out and seems to be getting better.  The wound has gone on to heal up at the incision site.  Objective: Vital signs stable oriented x3 she has complete healing of the incision sites first metatarsophalangeal joint and second toe.  She has limited range of motion most likely due to scar tissue first metatarsophalangeal joint that has improved from last visit.  Assessment: Resolving and healing surgical foot.  Right.  Plan: I demonstrated to her how I want her to exercise the toe she understands this and is amenable to it we will follow-up with her in 2 weeks for another set of x-rays at which time if not improved we will consider physical therapy.

## 2022-06-13 ENCOUNTER — Encounter: Admitting: Licensed Clinical Social Worker

## 2022-06-16 ENCOUNTER — Ambulatory Visit (INDEPENDENT_AMBULATORY_CARE_PROVIDER_SITE_OTHER): Admitting: Licensed Clinical Social Worker

## 2022-06-16 ENCOUNTER — Ambulatory Visit (HOSPITAL_COMMUNITY)
Admission: EM | Admit: 2022-06-16 | Discharge: 2022-06-16 | Disposition: A | Discharge status: Home / Self Care | Attending: Internal Medicine | Admitting: Internal Medicine

## 2022-06-16 ENCOUNTER — Ambulatory Visit (INDEPENDENT_AMBULATORY_CARE_PROVIDER_SITE_OTHER)

## 2022-06-16 ENCOUNTER — Encounter (HOSPITAL_COMMUNITY): Payer: Self-pay

## 2022-06-16 DIAGNOSIS — M25571 Pain in right ankle and joints of right foot: Secondary | ICD-10-CM | POA: Diagnosis not present

## 2022-06-16 DIAGNOSIS — W19XXXA Unspecified fall, initial encounter: Secondary | ICD-10-CM

## 2022-06-16 DIAGNOSIS — S93401A Sprain of unspecified ligament of right ankle, initial encounter: Secondary | ICD-10-CM | POA: Diagnosis not present

## 2022-06-16 DIAGNOSIS — F411 Generalized anxiety disorder: Secondary | ICD-10-CM

## 2022-06-16 MED ORDER — KETOROLAC TROMETHAMINE 30 MG/ML IJ SOLN
INTRAMUSCULAR | Status: AC
  Filled 2022-06-16: qty 1

## 2022-06-16 MED ORDER — ACETAMINOPHEN 500 MG PO TABS: 1000.0000 mg | ORAL_TABLET | Freq: Four times a day (QID) | ORAL | 0 refills | Status: AC | PRN

## 2022-06-16 MED ORDER — KETOROLAC TROMETHAMINE 30 MG/ML IJ SOLN
30.0000 mg | Freq: Once | INTRAMUSCULAR | Status: AC
  Administered 2022-06-16: 30 mg via INTRAMUSCULAR

## 2022-06-16 MED ORDER — IBUPROFEN 600 MG PO TABS: 600.0000 mg | ORAL_TABLET | Freq: Four times a day (QID) | ORAL | 0 refills | Status: DC | PRN

## 2022-06-16 NOTE — ED Triage Notes (Signed)
Pt fell injuring her rt hip,knee and foot.  S/P bunionectomy  rt foot on 04/21/22.

## 2022-06-28 ENCOUNTER — Ambulatory Visit (INDEPENDENT_AMBULATORY_CARE_PROVIDER_SITE_OTHER): Admitting: Podiatry

## 2022-06-28 ENCOUNTER — Encounter: Payer: Self-pay | Admitting: Podiatry

## 2022-06-28 ENCOUNTER — Ambulatory Visit (INDEPENDENT_AMBULATORY_CARE_PROVIDER_SITE_OTHER)

## 2022-06-28 DIAGNOSIS — Z9889 Other specified postprocedural states: Secondary | ICD-10-CM

## 2022-06-28 DIAGNOSIS — M2011 Hallux valgus (acquired), right foot: Secondary | ICD-10-CM

## 2022-06-28 DIAGNOSIS — M205X1 Other deformities of toe(s) (acquired), right foot: Secondary | ICD-10-CM

## 2022-06-28 NOTE — Progress Notes (Signed)
She presents today date of surgery 04/21/2022 right foot [place] bunionectomy and mallet toe second right.  She is very happy with the foot at this point she states that is really not too tender and she likes how it is positioned.  She denies fever chills nausea vomiting muscle aches and pains.  States that she is recently twisted that ankle as far she knows she did not damage to the toe itself.  Objective: Vital signs are stable alert oriented x3.  Pulses are palpable.  She has minimal edema no erythema cellulitis drainage odor she only has about 10 to 20 degrees of total range of motion with that being dorsiflexion as opposed to plantarflexion.  Radiographs taken today demonstrate an osseously mature individual internal fixation is intact does not appear to have been broken with the incident.  However there is some gapping of the superior cortex of the osteotomy which just has not gone on to heal yet.  Assessment: Well-healing surgical foot though she does have a stiff first metatarsophalangeal joint.  Plan: At this point I am going to send her to physical therapy

## 2022-07-06 ENCOUNTER — Telehealth: Payer: Self-pay | Admitting: *Deleted

## 2022-07-06 NOTE — Telephone Encounter (Signed)
Patient is requesting a referral for PT to [facility]. Referral has been placed , will fax to them.

## 2022-07-12 ENCOUNTER — Encounter: Admitting: Podiatry

## 2022-07-14 ENCOUNTER — Telehealth: Payer: Self-pay | Admitting: *Deleted

## 2022-07-14 NOTE — Telephone Encounter (Signed)
"  I'm trying to get a copy of my referral for physical therapy.  So that I can start physical therapy."

## 2022-08-16 ENCOUNTER — Encounter (HOSPITAL_COMMUNITY): Payer: Self-pay

## 2022-08-16 ENCOUNTER — Ambulatory Visit (INDEPENDENT_AMBULATORY_CARE_PROVIDER_SITE_OTHER): Admitting: Licensed Clinical Social Worker

## 2022-08-16 DIAGNOSIS — F4322 Adjustment disorder with anxiety: Secondary | ICD-10-CM | POA: Diagnosis not present

## 2022-08-16 NOTE — Progress Notes (Unsigned)
Comprehensive Clinical Assessment (CCA) Note  08/16/2022 Jaclyn Gray [id number]  Chief Complaint:  Chief Complaint  Patient presents with   Establish Care   Anxiety   Visit Diagnosis: ***    CCA Screening, Triage and Referral (STR)  Patient Reported Information How did you hear about [place]? Self  Referral name: LCSW at [facility] office  Referral phone number: No data recorded  Whom do you see for routine medical problems? Primary Care  Practice/Facility Name: No data recorded Practice/Facility Phone Number: No data recorded Name of Contact: No data recorded Contact Number: No data recorded Contact Fax Number: No data recorded Prescriber Name: No data recorded Prescriber Address (if known): No data recorded  What Is the Reason for Your Visit/Call Today? CJ Is a 41 year old female reporting to [facility] for establishment of outpatient psychotherapy services. Patient reports that she is experiencing some work related stress currently, and would like to develop coping skills to help manage this stress. Patient denies any psychiatric medication management or previous psychotherapy in the past. Patient denies any history or current suicidal ideation, homicidal ideation, or any perceptual disturbances. Patient reports that she is in active duty [employer] stationed here in [place]. Patient reports that her family of origin live in [place]. Patient reports that she is currently working as a Architectural technologist in [employer]. Patient reports that she has been in the [employer] for 16 years, and graduated with a bachelor's degree majoring in psychology. Patient reports that she is considering a return to [employer]. Patient reports that her primary external stressors are work related stress, current legal entanglements involving her work, and coping with chronic foot pain. Patient denies any current substance use.  How Long Has This Been Causing You Problems? > than 6  months  What Do You Feel Would Help You the Most Today? Treatment for Depression or other mood problem   Have You Recently Been in Any Inpatient Treatment (Hospital/Detox/Crisis Center/28-Day Program)? No  Name/Location of Program/Hospital:No data recorded How Long Were You There? No data recorded When Were You Discharged? No data recorded  Have You Ever Received Services From [facility] Before? Yes  Who Do You See at [facility]? No data recorded  Have You Recently Had Any Thoughts About Hurting Yourself? No  Are You Planning to Commit Suicide/Harm Yourself At This time? No   Have you Recently Had Thoughts About Santa Claus? No  Explanation: No data recorded  Have You Used Any Alcohol or Drugs in the Past 24 Hours? No  How Long Ago Did You Use Drugs or Alcohol? No data recorded What Did You Use and How Much? No data recorded  Do You Currently Have a Therapist/Psychiatrist? No  Name of Therapist/Psychiatrist: No data recorded  Have You Been Recently Discharged From Any Office Practice or Programs? No  Explanation of Discharge From Practice/Program: No data recorded    CCA Screening Triage Referral Assessment Type of Contact: Face-to-Face  Is this Initial or Reassessment? No data recorded Date Telepsych consult ordered in [facility]:  No data recorded Time Telepsych consult ordered in [facility]:  No data recorded  Patient Reported Information Reviewed? No data recorded Patient Left Without Being Seen? No data recorded Reason for Not Completing Assessment: No data recorded  Collateral Involvement: EPIC record review   Does Patient Have a [place]? No data recorded Name and Contact of Legal Guardian: No data recorded If Minor and Not Living with Parent(s), Who has Custody? No  data recorded Is CPS involved or ever been involved? Never  Is APS involved or ever been involved? Never   Patient Determined To Be At Risk for Harm To Self or Others  Based on Review of Patient Reported Information or Presenting Complaint? No  Method: No data recorded Availability of Means: No data recorded Intent: No data recorded Notification Required: No data recorded Additional Information for Danger to Others Potential: No data recorded Additional Comments for Danger to Others Potential: No data recorded Are There Guns or Other Weapons in Your Home? No data recorded Types of Guns/Weapons: No data recorded Are These Weapons Safely Secured?                            No data recorded Who Could Verify You Are Able To Have These Secured: No data recorded Do You Have any Outstanding Charges, Pending Court Dates, Parole/Probation? No data recorded Contacted To Inform of Risk of Harm To Self or Others: No data recorded  Location of Assessment: No data recorded  Does Patient Present under Involuntary Commitment? No data recorded IVC Papers Initial File Date: No data recorded  [place] of Residence: [place]   Patient Currently Receiving the Following Services: Not Receiving Services   Determination of Need: Routine (7 days)   Options For Referral: Outpatient Therapy     CCA Biopsychosocial Intake/Chief Complaint:  No data recorded Current Symptoms/Problems: No data recorded  Patient Reported Schizophrenia/Schizoaffective Diagnosis in Past: No data recorded  Strengths: No data recorded Preferences: No data recorded Abilities: No data recorded  Type of Services Patient Feels are Needed: No data recorded  Initial Clinical Notes/Concerns: No data recorded  Mental Health Symptoms Depression:  No data recorded  Duration of Depressive symptoms: No data recorded  Mania:  No data recorded  Anxiety:   No data recorded  Psychosis:  No data recorded  Duration of Psychotic symptoms: No data recorded  Trauma:  No data recorded  Obsessions:  No data recorded  Compulsions:  No data recorded  Inattention:  No data recorded   Hyperactivity/Impulsivity:  No data recorded  Oppositional/Defiant Behaviors:  No data recorded  Emotional Irregularity:  No data recorded  Other Mood/Personality Symptoms:  No data recorded   Mental Status Exam Appearance and self-care  Stature:  No data recorded  Weight:  No data recorded  Clothing:  No data recorded  Grooming:  No data recorded  Cosmetic use:  No data recorded  Posture/gait:  No data recorded  Motor activity:  No data recorded  Sensorium  Attention:  No data recorded  Concentration:  No data recorded  Orientation:  No data recorded  Recall/memory:  No data recorded  Affect and Mood  Affect:  No data recorded  Mood:  No data recorded  Relating  Eye contact:  No data recorded  Facial expression:  No data recorded  Attitude toward examiner:  No data recorded  Thought and Language  Speech flow: No data recorded  Thought content:  No data recorded  Preoccupation:  No data recorded  Hallucinations:  No data recorded  Organization:  No data recorded  [employer] of Knowledge:  No data recorded  Intelligence:  No data recorded  Abstraction:  No data recorded  Judgement:  No data recorded  Reality Testing:  No data recorded  Insight:  No data recorded  Decision Making:  No data recorded  Social Functioning  Social Maturity:  No data  recorded  Social Judgement:  No data recorded  Stress  Stressors:  No data recorded  Coping Ability:  No data recorded  Skill Deficits:  No data recorded  Supports:  No data recorded    Religion:    Leisure/Recreation:    Exercise/Diet:     CCA Employment/Education Employment/Work Situation:    Education:     CCA Family/Childhood History Family and Relationship History:    Childhood History:     Child/Adolescent Assessment:   N/a  CCA Substance Use Alcohol/Drug Use: Alcohol / Drug Use Pain Medications: SEE MAR Prescriptions: SEE MAR Over the Counter: SEE MAR History of alcohol  / drug use?: No history of alcohol / drug abuse Negative Consequences of Use:  (NONE) Withdrawal Symptoms: None    ASAM's:  Six Dimensions of Multidimensional Assessment  Dimension 1:  Acute Intoxication and/or Withdrawal Potential:   Dimension 1:  Description of individual's past and current experiences of substance use and withdrawal: NONE  Dimension 2:  Biomedical Conditions and Complications:      Dimension 3:  Emotional, Behavioral, or Cognitive Conditions and Complications:     Dimension 4:  Readiness to Change:     Dimension 5:  Relapse, Continued use, or Continued Problem Potential:     Dimension 6:  Recovery/Living Environment:     ASAM Severity Score: ASAM's Severity Rating Score: 0  ASAM Recommended Level of Treatment: ASAM Recommended Level of Treatment: Level I Outpatient Treatment   Substance use Disorder (SUD) Substance Use Disorder (SUD)  Checklist Symptoms of Substance Use:  (NONE)  Recommendations for Services/Supports/Treatments: Recommendations for Services/Supports/Treatments Recommendations For Services/Supports/Treatments: Medication Management, Individual Therapy  DSM5 Diagnoses: 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   Other examination of ears and hearing 02/20/2022   Pain in joint, lower leg 02/20/2022   Contraceptive management 02/20/2022   Screening for malignant neoplasm of cervix 02/20/2022   Sprain of sacroiliac region 02/20/2022   Uterine leiomyoma 10/26/2020   Stress at work 10/26/2020   Generalized anxiety disorder 05/24/2020   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   Bunion of right foot 07/31/2017   Pain in left foot 07/31/2017   Radial styloid tenosynovitis (de quervain) 06/15/2017   Pain in right hip 06/13/2017   Low back pain 05/07/2017   Neck pain 05/07/2017   Pain in thoracic spine 05/07/2017    Patient Centered Plan: Patient is  on the following Treatment Plan(s):  Anxiety   Referrals to Alternative Service(s): Referred to Alternative Service(s):   Place:   Date:   Time:    Referred to Alternative Service(s):   Place:   Date:   Time:    Referred to Alternative Service(s):   Place:   Date:   Time:    Referred to Alternative Service(s):   Place:   Date:   Time:      Collaboration of Care: Other n/a at time of session  Patient/Guardian was advised Release of Information must be obtained prior to any record release in order to collaborate their care with an outside provider. Patient/Guardian was advised if they have not already done so to contact the registration department to sign all necessary forms in order for [place] to release information regarding their care.   Consent: Patient/Guardian gives verbal consent for treatment and assignment of benefits for services provided during this visit. Patient/Guardian expressed understanding and agreed to proceed.   Shamira R Perlie Scheuring, LCSW

## 2022-08-16 NOTE — Plan of Care (Signed)
Developed tx plan based on pt self reported input

## 2022-09-11 ENCOUNTER — Encounter: Payer: Self-pay | Admitting: Physician Assistant

## 2022-09-11 ENCOUNTER — Telehealth: Payer: Self-pay

## 2022-09-11 ENCOUNTER — Telehealth (INDEPENDENT_AMBULATORY_CARE_PROVIDER_SITE_OTHER): Admitting: Physician Assistant

## 2022-09-11 VITALS — Ht 61.0 in | Wt 142.0 lb

## 2022-09-11 DIAGNOSIS — U071 COVID-19: Secondary | ICD-10-CM | POA: Diagnosis not present

## 2022-09-11 MED ORDER — AZITHROMYCIN 250 MG PO TABS: ORAL_TABLET | ORAL | 0 refills | Status: DC

## 2022-09-11 NOTE — Progress Notes (Signed)
[facility] [place], [place] [address]  Internal MEDICINE  Telephone Visit  Patient Name: Jaclyn Gray  [id number]  [id number]  Date of Service: 09/11/2022  I connected with the patient at 10:43 by telephone and verified the patients identity using two identifiers.   I discussed the limitations, risks, security and privacy concerns of performing an evaluation and management service by telephone and the availability of in person appointments. I also discussed with the patient that there may be a patient responsible charge related to the service.  The patient expressed understanding and agrees to proceed.    Chief Complaint  Patient presents with   Telephone Assessment    [id number]   Telephone Screen    Covid positive    Sinusitis   Cough    HPI Pt is for virtual sick visit -Started having symptoms yesterday with congestion, cough, body aches. No fever or chills. Denies N or V, but has had some diarrhea. -Did drink some OJ and tea. Has not used any mucinex or nasal sprays -She is currently breast feeding so none of her home meds are current.  -Did take covid test around 10am and is going to call office with results to guide treatment plan.  -Pt did call back and is covid positive  Current Medication: Outpatient Encounter Medications as of 09/11/2022  Medication Sig   azithromycin (ZITHROMAX) 250 MG tablet Take one tab a day for 10 days for uri   acetaminophen (TYLENOL) 500 MG tablet Take 2 tablets (1,000 mg total) by mouth every 6 (six) hours as needed.   ibuprofen (ADVIL) 600 MG tablet Take 1 tablet (600 mg total) by mouth every 6 (six) hours as needed.   Prenatal Vit-Fe Fumarate-FA (MULTIVITAMIN-PRENATAL) 27-0.8 MG TABS tablet Take 1 tablet by mouth daily at 12 noon.   No facility-administered encounter medications on file as of 09/11/2022.    Surgical History: Past Surgical History:  Procedure Laterality Date   BUNIONECTOMY Right 04/21/2022    WISDOM TOOTH EXTRACTION     x4 extracted     Medical History: Past Medical History:  Diagnosis Date   ASCUS with positive high risk HPV cervical on 07/21/2020 07/29/2020   At risk for fertility problems 08/02/2019   Generalized anxiety disorder 05/24/2020   Gestational hypertension 02/10/2021   Low back pain 05/07/2017   Uterine leiomyoma 10/26/2020   At anatomy u/s 09/15/20:  Myomas  Site                     L(cm)      W(cm)      D(cm)       Location  Posterior                5.7        4.7        5.4         Intramural  Posterior                3.5        3.7        4           Intramural  Posterior                3.3        2.6        3.2         Intramural   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  Family History: Family History  Problem Relation Age of Onset   Hypertension Mother    Breast cancer Mother 53   Prostate cancer Father    Hypertension Father    Diabetes Father    Throat cancer Maternal Grandmother    Colon cancer Maternal Grandmother    Lung cancer Maternal Grandfather     Social History   Socioeconomic History   Marital status: Single    Spouse name: Not on file   Number of children: Not on file   Years of education: Not on file   Highest education level: Not on file  Occupational History   Occupation: Nature conservation officer  Tobacco Use   Smoking status: Never    Passive exposure: Never   Smokeless tobacco: Never  Vaping Use   Vaping Use: Never used  Substance and Sexual Activity   Alcohol use: Not Currently    Comment: social    Drug use: Never   Sexual activity: Not Currently    Birth control/protection: None  Other Topics Concern   Not on file  Social History Narrative   Not on file   Social Determinants of Health   Financial Resource Strain: Not on file  Food Insecurity: Not on file  Transportation Needs: Not on file  Physical Activity: Not on file  Stress: Not on file  Social Connections: Not on file  Intimate Partner  Violence: Not on file      Review of Systems  Constitutional:  Negative for chills and fever.  HENT:  Positive for congestion, postnasal drip and sinus pressure. Negative for mouth sores.   Respiratory:  Positive for cough. Negative for shortness of breath and wheezing.   Cardiovascular:  Negative for chest pain.  Gastrointestinal:  Positive for diarrhea. Negative for nausea and vomiting.  Genitourinary:  Negative for flank pain.  Musculoskeletal:  Positive for myalgias.  Psychiatric/Behavioral: Negative.      Vital Signs: Ht '5\' 1"'$  (1.549 m)   Wt 142 lb (64.4 kg)   BMI 26.83 kg/m    Observation/Objective:  Pt is able to carry out conversation   Assessment/Plan: 1. Acute COVID-19 Will start on zpak and advised to rest and stay well hydrated, as well as start on nasal spray like flonase to help sinus congestion and postnasal drip. - azithromycin (ZITHROMAX) 250 MG tablet; Take one tab a day for 10 days for uri  Dispense: 10 tablet; Refill: 0   General Counseling: Aolanis verbalizes understanding of the findings of today's phone visit and agrees with plan of treatment. I have discussed any further diagnostic evaluation that may be needed or ordered today. We also reviewed her medications today. she has been encouraged to call the office with any questions or concerns that should arise related to todays visit.    No orders of the defined types were placed in this encounter.   Meds ordered this encounter  Medications   azithromycin (ZITHROMAX) 250 MG tablet    Sig: Take one tab a day for 10 days for uri    Dispense:  10 tablet    Refill:  0    Time spent:25 Minutes    Dr Lavera Guise Internal medicine

## 2022-09-11 NOTE — Telephone Encounter (Signed)
Pt called that she is positive for Covid she was seen today by lauren advised her as per lauren that we send Zithromax for 10 days and also she can use OTC Flonase and used sinus rinse ,gargle with salt water and drink plenty of water and rest call us back in 2 days if symptoms getting worse go to ED

## 2022-09-15 ENCOUNTER — Telehealth: Payer: Self-pay

## 2022-09-15 NOTE — Telephone Encounter (Signed)
Pt called that she still coughing and her eye hurting and pt unable to get antibiotic until yesterday as per lauren advised her lauren advised her to take antibiotic and tylenol and if symptoms worse go to ED

## 2022-09-18 ENCOUNTER — Encounter: Payer: Self-pay | Admitting: Podiatry

## 2022-09-18 ENCOUNTER — Ambulatory Visit (INDEPENDENT_AMBULATORY_CARE_PROVIDER_SITE_OTHER): Admitting: Podiatry

## 2022-09-18 DIAGNOSIS — L603 Nail dystrophy: Secondary | ICD-10-CM

## 2022-09-18 NOTE — Progress Notes (Signed)
She presents today with a chief concern of thick brittle toenails.  I had told her to come back when they were long enough for [place] to take samples.  Objective: Toenails are long and some of the nails are thicker.  Not demonstrating significant mycotic changes.  Assessment nail dystrophy.  Plan: Samples of the skin and nail were taken today for pathologic evaluation follow-up with her in 1 month for biopsy report and treatment plan.

## 2022-09-27 ENCOUNTER — Other Ambulatory Visit: Payer: Self-pay

## 2022-09-27 MED ORDER — TRIAMCINOLONE ACETONIDE 0.1 % EX CREA: 1.0000 | TOPICAL_CREAM | Freq: Two times a day (BID) | CUTANEOUS | 1 refills | Status: AC

## 2022-09-27 NOTE — Telephone Encounter (Signed)
Pt called and advised the Azithromycin she was given is possibly making her have some breaking out on her face and itching that started 1-2 days after starting.  I advised pt to stop the medication and Per Alyssa we sent in triamcinolone cream 0.1% to her pharmacy.  Patient is also still having yellow mucus and a dry/wet cough.  Per Alyssa with pt still breastfeeding she can use OTC mucinex and cough drops.  Pt understood.

## 2022-10-03 ENCOUNTER — Encounter: Admitting: Nurse Practitioner

## 2022-10-04 ENCOUNTER — Ambulatory Visit (INDEPENDENT_AMBULATORY_CARE_PROVIDER_SITE_OTHER): Admitting: Licensed Clinical Social Worker

## 2022-10-04 DIAGNOSIS — F4322 Adjustment disorder with anxiety: Secondary | ICD-10-CM

## 2022-10-04 NOTE — Plan of Care (Signed)
  Problem: Anxiety Disorder Goal: Reduce overall frequency, intensity, and duration of the anxiety so that daily functioning is not impaired per pt self report 3 out of 5 sessions documented.   Outcome: Progressing Goal: Learn and implement coping skills that result in a reduction of anxiety and worry, and improve daily functioning per self report 3 out of 5 sessions documented Outcome: Progressing Intervention: Assist with relaxation techniques, as appropriate (deep breathing exercises, meditation, guided imagery) Note: Reviewed  Intervention: Encourage verbalization of feelings/concerns/expectations Note: Allowed pt to explore

## 2022-10-04 NOTE — Progress Notes (Signed)
THERAPIST PROGRESS NOTE  Session Time: 1015-11a  [place] in office visit for patient and LCSW clinician  Participation Level: Active  Behavioral Response: Neat and Well GroomedAlertAnxious  Type of Therapy: Individual Therapy  Treatment Goals addressed: Problem: Anxiety Disorder Goal: Reduce overall frequency, intensity, and duration of the anxiety so that daily functioning is not impaired per pt self report 3 out of 5 sessions documented.   Outcome: Progressing Goal: Learn and implement coping skills that result in a reduction of anxiety and worry, and improve daily functioning per self report 3 out of 5 sessions documented Outcome: Progressing Intervention: Assist with relaxation techniques, as appropriate (deep breathing exercises, meditation, guided imagery) Note: Reviewed  Intervention: Encourage verbalization of feelings/concerns/expectations Note: Allowed pt to explore  ProgressTowards Goals: Progressing  Interventions: CBT and Supportive  Summary: Jaclyn Gray is a 41 y.o. female who presents with symptoms consistent with adjustment d/o w/anxiety. Pt reports that appetite is WNL and that overall mood is stable. 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  Allowed pt to explore and express thoughts and feelings associated with recent life situations and external stressors. Allowed pt to explore concerns dating back to COVID in regards to work. Pt states that she refused COVID vaccine while pregnant and that she was threatened to be discharged from the [employer]. Pt states that she was "reprimand flagged" as ineligible for transfers/promotions because of this.   Discussed pain associated with a foot surgery and how pt felt stress over requesting time off for the surgery and getting pushback from her supervisors. Pt feels that "bullying" and  "harassment" stems from her requesting stress leave in 2021.   Pt states she has spent a lot of time drafting a 45 page document of her complaints and submitted the information yesterday. Pt states "either they will discharge me or they won't". 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 Pt states she has committed to [employer] for 16 years. 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   Allowed pt to do life visualization and identified current barriers. Discussed personal goals and allowed pt to explore what steps she would need to take to reach her personal goals.   Reviewed ways of calming/reducing distress in the moment.  Continued recommendations are as follows: self care behaviors, positive social engagements, focusing on overall work/home/life balance, and focusing on positive physical and emotional wellness.   Suicidal/Homicidal: No  Therapist Response: Pt is continuing to apply interventions learned in session into daily life situations. Pt is currently on track to meet goals utilizing interventions mentioned above. Personal growth and progress noted. Treatment to continue as indicated.   Plan: Return again in 4 weeks. Pt request virtual appt next visit.  Diagnosis:  Encounter Diagnosis  Name Primary?   Adjustment disorder with anxious mood Yes   Collaboration of Care: Other n/a at time of session  Patient/Guardian was advised Release of Information must be obtained prior to any record release in order to collaborate their care with an outside provider. Patient/Guardian was advised if they have not already done so to contact the registration department to sign all necessary forms in order for [place] to release information regarding their care.   Consent: Patient/Guardian gives verbal consent for treatment  and assignment of benefits for  services provided during this visit. Patient/Guardian expressed understanding and agreed to proceed.   [place], LCSW 10/04/2022

## 2022-10-13 ENCOUNTER — Telehealth: Payer: Self-pay | Admitting: Nurse Practitioner

## 2022-10-13 NOTE — Telephone Encounter (Signed)
Patient lvm @ 3:00 am on after-hour call line. Only left her initials & telephone #. No message. Returned her call this morning @ 9:15. No answer, lvm to return my call-Toni

## 2022-10-16 ENCOUNTER — Encounter: Admitting: Nurse Practitioner

## 2022-10-30 ENCOUNTER — Ambulatory Visit (INDEPENDENT_AMBULATORY_CARE_PROVIDER_SITE_OTHER): Admitting: Podiatry

## 2022-10-30 DIAGNOSIS — L603 Nail dystrophy: Secondary | ICD-10-CM

## 2022-10-30 NOTE — Progress Notes (Signed)
She presents today for follow-up of her nail pathology.  Objective: Vital signs are stable alert oriented x3 nail pathology has not returned and appears to have been lost in the mail.  So samples of the nail were taken once again today.  Assessment nail dystrophy right foot.  Plan: Follow-up with her in about 2 to 3 weeks nail samples were taken again today.

## 2022-11-05 ENCOUNTER — Encounter: Payer: Self-pay | Admitting: Nurse Practitioner

## 2022-11-06 ENCOUNTER — Encounter: Payer: Self-pay | Admitting: Nurse Practitioner

## 2022-11-06 ENCOUNTER — Telehealth: Payer: Self-pay | Admitting: Nurse Practitioner

## 2022-11-06 ENCOUNTER — Telehealth (INDEPENDENT_AMBULATORY_CARE_PROVIDER_SITE_OTHER): Admitting: Nurse Practitioner

## 2022-11-06 VITALS — Resp 16 | Ht 61.0 in | Wt 141.0 lb

## 2022-11-06 DIAGNOSIS — A084 Viral intestinal infection, unspecified: Secondary | ICD-10-CM

## 2022-11-06 DIAGNOSIS — R1114 Bilious vomiting: Secondary | ICD-10-CM

## 2022-11-06 MED ORDER — PROMETHAZINE HCL 12.5 MG PO TABS: 12.5000 mg | ORAL_TABLET | Freq: Three times a day (TID) | ORAL | 0 refills | Status: DC | PRN

## 2022-11-06 NOTE — Progress Notes (Signed)
[facility] [place], [place] [address]  Internal MEDICINE  Telephone Visit  Patient Name: Jaclyn Gray  [id number]  [id number]  Date of Service: 11/06/2022  I connected with the patient at 1645 by telephone and verified the patients identity using two identifiers.   I discussed the limitations, risks, security and privacy concerns of performing an evaluation and management service by telephone and the availability of in person appointments. I also discussed with the patient that there may be a patient responsible charge related to the service.  The patient expressed understanding and agrees to proceed.    Chief Complaint  Patient presents with   Telephone Screen    [phone]   Telephone Assessment    Stomach bug- daughter had it.    Vomiting   Headache    HPI Jaclyn Gray presents for a telehealth virtual visit for "stomach bug".  -daughter had it first.  --reports vomiting and headache Also having diarrhea, nausea, fatigue, chills, feeling feverish, abdominal pain --still breast feeding daughter on demand 4-6 times per day on average.    Current Medication: Outpatient Encounter Medications as of 11/06/2022  Medication Sig   acetaminophen (TYLENOL) 500 MG tablet Take 2 tablets (1,000 mg total) by mouth every 6 (six) hours as needed.   ibuprofen (ADVIL) 600 MG tablet Take 1 tablet (600 mg total) by mouth every 6 (six) hours as needed.   Prenatal Vit-Fe Fumarate-FA (MULTIVITAMIN-PRENATAL) 27-0.8 MG TABS tablet Take 1 tablet by mouth daily at 12 noon.   promethazine (PHENERGAN) 12.5 MG tablet Take 1 tablet (12.5 mg total) by mouth every 8 (eight) hours as needed for nausea or vomiting.   triamcinolone cream (KENALOG) 0.1 % Apply 1 Application topically 2 (two) times daily. To affected area   No facility-administered encounter medications on file as of 11/06/2022.    Surgical History: Past Surgical History:  Procedure Laterality Date    BUNIONECTOMY Right 04/21/2022   WISDOM TOOTH EXTRACTION     x4 extracted     Medical History: Past Medical History:  Diagnosis Date   ASCUS with positive high risk HPV cervical on 07/21/2020 07/29/2020   At risk for fertility problems 08/02/2019   Generalized anxiety disorder 05/24/2020   Gestational hypertension 02/10/2021   Low back pain 05/07/2017   Uterine leiomyoma 10/26/2020   At anatomy u/s 09/15/20:  Myomas  Site                     L(cm)      W(cm)      D(cm)       Location  Posterior                5.7        4.7        5.4         Intramural  Posterior                3.5        3.7        4           Intramural  Posterior                3.3        2.6        3.2         Intramural   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    Family History: Family History  Problem Relation Age of  Onset   Hypertension Mother    Breast cancer Mother 65   Prostate cancer Father    Hypertension Father    Diabetes Father    Throat cancer Maternal Grandmother    Colon cancer Maternal Grandmother    Lung cancer Maternal Grandfather     Social History   Socioeconomic History   Marital status: Single    Spouse name: Not on file   Number of children: Not on file   Years of education: Not on file   Highest education level: Not on file  Occupational History   Occupation: Nature conservation officer  Tobacco Use   Smoking status: Never    Passive exposure: Never   Smokeless tobacco: Never  Vaping Use   Vaping Use: Never used  Substance and Sexual Activity   Alcohol use: Not Currently    Comment: social    Drug use: Never   Sexual activity: Not Currently    Birth control/protection: None  Other Topics Concern   Not on file  Social History Narrative   Not on file   Social Determinants of Health   Financial Resource Strain: Not on file  Food Insecurity: Not on file  Transportation Needs: Not on file  Physical Activity: Not on file  Stress: Not on file  Social Connections:  Not on file  Intimate Partner Violence: Not on file      Review of Systems  Constitutional:  Positive for appetite change and chills. Negative for fatigue and fever (feeling feverish).  HENT: Negative.    Respiratory: Negative.  Negative for cough, chest tightness, shortness of breath and wheezing.   Cardiovascular: Negative.  Negative for chest pain and palpitations.  Gastrointestinal:  Positive for abdominal pain, diarrhea, nausea and vomiting. Negative for constipation.  Musculoskeletal: Negative.   Neurological:  Positive for headaches.    Vital Signs: Resp 16   Ht '5\' 1"'$  (1.549 m)   Wt 141 lb (64 kg)   BMI 26.64 kg/m    Observation/Objective: She is alert and oriented and engages in conversation appropriately. She does not sound as though she is in any acute distress over telephone call.     Assessment/Plan: 1. Viral gastroenteritis Keep hydrating, slowly advance diet as tolerated, take phenergan if needed as prescribed.  - promethazine (PHENERGAN) 12.5 MG tablet; Take 1 tablet (12.5 mg total) by mouth every 8 (eight) hours as needed for nausea or vomiting.  Dispense: 20 tablet; Refill: 0  2. Bilious vomiting with nausea Phenergan prescribed.  - promethazine (PHENERGAN) 12.5 MG tablet; Take 1 tablet (12.5 mg total) by mouth every 8 (eight) hours as needed for nausea or vomiting.  Dispense: 20 tablet; Refill: 0   General Counseling: Jaclyn Gray verbalizes understanding of the findings of today's phone visit and agrees with plan of treatment. I have discussed any further diagnostic evaluation that may be needed or ordered today. We also reviewed her medications today. she has been encouraged to call the office with any questions or concerns that should arise related to todays visit.  Return if symptoms worsen or fail to improve.   No orders of the defined types were placed in this encounter.   Meds ordered this encounter  Medications   promethazine (PHENERGAN) 12.5 MG  tablet    Sig: Take 1 tablet (12.5 mg total) by mouth every 8 (eight) hours as needed for nausea or vomiting.    Dispense:  20 tablet    Refill:  0    Time spent:10 Minutes Time spent with patient  included reviewing progress notes, labs, imaging studies, and discussing plan for follow up.   Controlled Substance Database was reviewed by me for overdose risk score (ORS) if appropriate.  This patient was seen by [name], FNP-C in collaboration with Dr. [name] as a part of collaborative care agreement.  Loreta Blouch R. Valetta Fuller, MSN, FNP-C Internal medicine

## 2022-11-06 NOTE — Telephone Encounter (Signed)
Error

## 2022-11-08 ENCOUNTER — Encounter: Payer: Self-pay | Admitting: Nurse Practitioner

## 2022-11-08 ENCOUNTER — Telehealth (INDEPENDENT_AMBULATORY_CARE_PROVIDER_SITE_OTHER): Admitting: Nurse Practitioner

## 2022-11-08 VITALS — BP 116/89 | HR 71 | Resp 16 | Ht 61.0 in | Wt 142.0 lb

## 2022-11-08 DIAGNOSIS — G43109 Migraine with aura, not intractable, without status migrainosus: Secondary | ICD-10-CM

## 2022-11-08 NOTE — Progress Notes (Signed)
[facility] [place], [place] [address]  Internal MEDICINE  Telephone Visit  Patient Name: Jaclyn Gray  [id number]  [id number]  Date of Service: 11/08/2022  I connected with the patient at 1645 by telephone and verified the patients identity using two identifiers.   I discussed the limitations, risks, security and privacy concerns of performing an evaluation and management service by telephone and the availability of in person appointments. I also discussed with the patient that there may be a patient responsible charge related to the service.  The patient expressed understanding and agrees to proceed.    Chief Complaint  Patient presents with   Telephone Screen   Telephone Assessment   Headache    HPI Jaclyn Gray presents for a telehealth virtual visit for headache and nausea. Needs a note. Wants to try acupuncture   Current Medication: Outpatient Encounter Medications as of 11/08/2022  Medication Sig   acetaminophen (TYLENOL) 500 MG tablet Take 2 tablets (1,000 mg total) by mouth every 6 (six) hours as needed.   ibuprofen (ADVIL) 600 MG tablet Take 1 tablet (600 mg total) by mouth every 6 (six) hours as needed.   Prenatal Vit-Fe Fumarate-FA (MULTIVITAMIN-PRENATAL) 27-0.8 MG TABS tablet Take 1 tablet by mouth daily at 12 noon.   promethazine (PHENERGAN) 12.5 MG tablet Take 1 tablet (12.5 mg total) by mouth every 8 (eight) hours as needed for nausea or vomiting.   triamcinolone cream (KENALOG) 0.1 % Apply 1 Application topically 2 (two) times daily. To affected area   No facility-administered encounter medications on file as of 11/08/2022.    Surgical History: Past Surgical History:  Procedure Laterality Date   BUNIONECTOMY Right 04/21/2022   WISDOM TOOTH EXTRACTION     x4 extracted     Medical History: Past Medical History:  Diagnosis Date   ASCUS with positive high risk HPV cervical on 07/21/2020 07/29/2020   At risk for fertility problems  08/02/2019   Generalized anxiety disorder 05/24/2020   Gestational hypertension 02/10/2021   Low back pain 05/07/2017   Uterine leiomyoma 10/26/2020   At anatomy u/s 09/15/20:  Myomas  Site                     L(cm)      W(cm)      D(cm)       Location  Posterior                5.7        4.7        5.4         Intramural  Posterior                3.5        3.7        4           Intramural  Posterior                3.3        2.6        3.2         Intramural   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    Family History: Family History  Problem Relation Age of Onset   Hypertension Mother    Breast cancer Mother 7   Prostate cancer Father    Hypertension Father    Diabetes Father    Throat cancer Maternal Grandmother    Colon cancer Maternal Grandmother  Lung cancer Maternal Grandfather     Social History   Socioeconomic History   Marital status: Single    Spouse name: Not on file   Number of children: Not on file   Years of education: Not on file   Highest education level: Not on file  Occupational History   Occupation: Nature conservation officer  Tobacco Use   Smoking status: Never    Passive exposure: Never   Smokeless tobacco: Never  Vaping Use   Vaping Use: Never used  Substance and Sexual Activity   Alcohol use: Not Currently    Comment: social    Drug use: Never   Sexual activity: Not Currently    Birth control/protection: None  Other Topics Concern   Not on file  Social History Narrative   Not on file   Social Determinants of Health   Financial Resource Strain: Not on file  Food Insecurity: Not on file  Transportation Needs: Not on file  Physical Activity: Not on file  Stress: Not on file  Social Connections: Not on file  Intimate Partner Violence: Not on file      Review of Systems  Constitutional:  Positive for appetite change and chills. Negative for fatigue and fever (feeling feverish).  HENT: Negative.    Respiratory: Negative.   Negative for cough, chest tightness, shortness of breath and wheezing.   Cardiovascular: Negative.  Negative for chest pain and palpitations.  Gastrointestinal:  Positive for abdominal pain, diarrhea, nausea and vomiting. Negative for constipation.  Musculoskeletal: Negative.   Neurological:  Positive for headaches.    Vital Signs: BP 116/89   Pulse 71   Resp 16   Ht '5\' 1"'$  (1.549 m)   Wt 142 lb (64.4 kg)   BMI 26.83 kg/m    Observation/Objective: She is alert and oriented and engages in conversation appropriately. She does not sound as though she is in any acute distress over telephone call.      Assessment/Plan: 1. Migraine with aura and without status migrainosus, not intractable Referred to acupuncture - Ambulatory referral for Acupuncture per patient request   General Counseling: Jaclyn Gray verbalizes understanding of the findings of today's phone visit and agrees with plan of treatment. I have discussed any further diagnostic evaluation that may be needed or ordered today. We also reviewed her medications today. she has been encouraged to call the office with any questions or concerns that should arise related to todays visit.  Return if symptoms worsen or fail to improve.   Orders Placed This Encounter  Procedures   Ambulatory referral for Acupuncture    No orders of the defined types were placed in this encounter.   Time spent:10 Minutes Time spent with patient included reviewing progress notes, labs, imaging studies, and discussing plan for follow up.  [place] Controlled Substance Database was reviewed by me for overdose risk score (ORS) if appropriate.  This patient was seen by [name], FNP-C in collaboration with Dr. [name] as a part of collaborative care agreement.  [name], MSN, FNP-C Internal medicine

## 2022-11-09 ENCOUNTER — Telehealth: Payer: Self-pay | Admitting: Nurse Practitioner

## 2022-11-09 ENCOUNTER — Encounter: Payer: Self-pay | Admitting: Podiatry

## 2022-11-09 NOTE — Telephone Encounter (Signed)
Awaiting 11/08/22 office notes for acupuncture referral-Toni

## 2022-11-22 ENCOUNTER — Encounter: Payer: Self-pay | Admitting: Podiatry

## 2022-11-22 ENCOUNTER — Ambulatory Visit (INDEPENDENT_AMBULATORY_CARE_PROVIDER_SITE_OTHER): Admitting: Podiatry

## 2022-11-22 ENCOUNTER — Ambulatory Visit (HOSPITAL_COMMUNITY): Admitting: Licensed Clinical Social Worker

## 2022-11-22 DIAGNOSIS — L603 Nail dystrophy: Secondary | ICD-10-CM | POA: Diagnosis not present

## 2022-11-22 NOTE — Progress Notes (Signed)
She presents today for follow-up of her first metatarsal phalangeal joint of her right foot status post bunion repair.  States that is still stiff at times and still uncomfortable at times.  She continues to breast-feed.  She also presents today for a follow-up of her nail pathology.  Objective: Physical exam unchanged.  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  Onychomycosis foot bilateral.  Plan: Discussed etiology pathology conservative surgical therapies at this point given that she is still breast-feeding laser therapies can be her best option.  We did go over the cost of the therapy as well as the 70 to 80% resolution right.  We are releasing her to get back to regular duty from her foot surgery.  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  She was told that she had to return to duty in June which ended up being June 19.  I had requested that she be out of work with limited weightbearing for 3 months however she was not allowed to do so by her employer.  I feel that this may have compromised the integrity of her healing and the ability to perform her physical therapy.  I will follow-up with her on an as-needed basis.

## 2022-11-29 ENCOUNTER — Telehealth: Payer: Self-pay | Admitting: Nurse Practitioner

## 2022-11-29 ENCOUNTER — Ambulatory Visit (INDEPENDENT_AMBULATORY_CARE_PROVIDER_SITE_OTHER): Admitting: Podiatry

## 2022-11-29 DIAGNOSIS — B351 Tinea unguium: Secondary | ICD-10-CM

## 2022-11-29 DIAGNOSIS — L603 Nail dystrophy: Secondary | ICD-10-CM

## 2022-11-29 NOTE — Telephone Encounter (Signed)
Per [facility] with [employer], acupuncture is a non-covered benefit unless patient goes to designated location which is 68 miles away @ [facility] [phone]). Ref #[id number]. 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 I s/w patient, she will call them to determine if she can be seen there or not and call me back-Toni

## 2022-11-29 NOTE — Progress Notes (Signed)
Patient presents today for the 1st laser treatment. 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    Toenail most affected right 5th toe.    All other systems are negative.   Nails were filed thin. Laser therapy was administered to 1-5 toenails right foot and patient tolerated the treatment well. All safety precautions were in place.      Follow up in 4 weeks for laser # 2.

## 2022-12-14 ENCOUNTER — Telehealth: Payer: Self-pay | Admitting: Nurse Practitioner

## 2022-12-14 NOTE — Telephone Encounter (Signed)
Received vm from patient stating [employer] told her if we found local acupuncture clinic, they will possibly approve. I called patient back, explained I had called 5 different clinics and nobody accepts [employer]. She will try to find clinic in [facility], then call me back so I can obtain approval and send referral-Toni

## 2022-12-15 ENCOUNTER — Ambulatory Visit (INDEPENDENT_AMBULATORY_CARE_PROVIDER_SITE_OTHER): Admitting: Family Medicine

## 2022-12-15 ENCOUNTER — Encounter: Payer: Self-pay | Admitting: Family Medicine

## 2022-12-15 VITALS — BP 110/74 | HR 84 | Ht 61.0 in | Wt 146.0 lb

## 2022-12-15 DIAGNOSIS — O9229 Other disorders of breast associated with pregnancy and the puerperium: Secondary | ICD-10-CM | POA: Diagnosis not present

## 2022-12-15 NOTE — Progress Notes (Signed)
   GYNECOLOGY PROBLEM  VISIT ENCOUNTER NOTE  Subjective:   Jaclyn Gray is a 41 y.o. G70P1001 female here for a problem GYN visit.  Current complaints: shooting pain in breast for 1 month. Nipples have blisters. Currently nursing.   Previously benign cyst on the right. Symptoms are no the left currently. Mother used epsom salts they this helped. Denies abnormal vaginal bleeding, discharge, pelvic pain, problems with intercourse or other gynecologic concerns.    Gynecologic History No LMP recorded. (Menstrual status: Lactating).  Contraception: abstinence  Health Maintenance Due  Topic Date Due   HPV VACCINES (2 - 3-dose series) 03/31/2009   INFLUENZA VACCINE  07/25/2022    The following portions of the patient's history were reviewed and updated as appropriate: allergies, current medications, past family history, past medical history, past social history, past surgical history and problem list.  Review of Systems Pertinent items are noted in HPI.   Objective:  BP 110/74   Pulse 84   Ht '5\' 1"'$  (1.549 m)   Wt 146 lb (66.2 kg)   Breastfeeding Yes   BMI 27.59 kg/m  Gen: well appearing, NAD HEENT: no scleral icterus CV: RR Lung: Normal WOB Ext: warm well perfused  Breast: symmetric, everted nipples bilaterally. Right and left breast with fibrocystic changes/dense tissue. On left corresponding to the distribution of the"zinging/shooting" sensation that patient describes is a < 1cm elongated rubbery area that feels like a clogged milk duct. Area is not tender No predominant cysts or masses otherwise. Non-tender to palpation. No nipple discharge bilaterally.   Assessment and Plan:  1. Pain of breast during breastfeeding Exam and history if most consistent with clogged duct with let down sensation Watchful waiting Discussed options of mammogram now vs in 6 months (which she plans to wean/try for second pregnancy).  Patient would like to wait 6 months to have imaging.     Please refer to After Visit Summary for other counseling recommendations.   Return in about 6 months (around 06/16/2023) for mammogram referral.  Caren Macadam, MD, MPH, ABFM Attending [place] for [facility]

## 2022-12-27 ENCOUNTER — Ambulatory Visit (INDEPENDENT_AMBULATORY_CARE_PROVIDER_SITE_OTHER)

## 2022-12-27 DIAGNOSIS — L603 Nail dystrophy: Secondary | ICD-10-CM

## 2022-12-27 NOTE — Progress Notes (Signed)
Patient presents today for the 2nd laser treatment. 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    Toenail most affected right 5th toe. Patient nails has improved significantly. Toenails are no longer thick or discolored at this time. Advised patient to monitor the toenails for any changes, if changes are noted patient to call the office to schedule next laser treatment if they remain clear patient will follow-up with Dr. Milinda Pointer in 3 months. Patient verbalized understanding directions. Was unable to obtain pictures at this visit.     All other systems are negative.   Nails were filed thin. Laser therapy was administered to 1-5 toenails right foot and patient tolerated the treatment well. All safety precautions were in place.      Follow up in 4 weeks for laser # 3.

## 2023-01-02 ENCOUNTER — Encounter: Payer: Self-pay | Admitting: Nurse Practitioner

## 2023-01-02 ENCOUNTER — Telehealth: Payer: Self-pay | Admitting: Nurse Practitioner

## 2023-01-02 ENCOUNTER — Ambulatory Visit (INDEPENDENT_AMBULATORY_CARE_PROVIDER_SITE_OTHER): Admitting: Nurse Practitioner

## 2023-01-02 VITALS — BP 115/74 | HR 70 | Temp 98.3°F | Resp 16 | Ht 61.0 in | Wt 151.8 lb

## 2023-01-02 DIAGNOSIS — E782 Mixed hyperlipidemia: Secondary | ICD-10-CM

## 2023-01-02 DIAGNOSIS — R300 Dysuria: Secondary | ICD-10-CM

## 2023-01-02 DIAGNOSIS — R109 Unspecified abdominal pain: Secondary | ICD-10-CM

## 2023-01-02 DIAGNOSIS — M151 Heberden's nodes (with arthropathy): Secondary | ICD-10-CM

## 2023-01-02 DIAGNOSIS — R197 Diarrhea, unspecified: Secondary | ICD-10-CM | POA: Diagnosis not present

## 2023-01-02 DIAGNOSIS — G43109 Migraine with aura, not intractable, without status migrainosus: Secondary | ICD-10-CM | POA: Diagnosis not present

## 2023-01-02 DIAGNOSIS — E559 Vitamin D deficiency, unspecified: Secondary | ICD-10-CM

## 2023-01-02 DIAGNOSIS — E538 Deficiency of other specified B group vitamins: Secondary | ICD-10-CM

## 2023-01-02 LAB — POCT URINALYSIS DIPSTICK
Bilirubin, UA: NEGATIVE
Blood, UA: NEGATIVE
Glucose, UA: NEGATIVE
Ketones, UA: NEGATIVE
Leukocytes, UA: NEGATIVE
Nitrite, UA: NEGATIVE
Protein, UA: NEGATIVE
Spec Grav, UA: <=1.005 — AB (ref 1.010–1.025)
Urobilinogen, UA: 0.2 E.U./dL
pH, UA: 7.5 (ref 5.0–8.0)

## 2023-01-02 NOTE — Telephone Encounter (Signed)
Called back to [employer] regarding acupuncture referral to either [facility] or [facility]. Per Doroteo Bradford, patient was advised by [employer] on 12/28/22, that she needed to reach out to these areas to see if they offered services for acupuncture. If they do, no referral required from [employer] by our office. Ref #[id number]

## 2023-01-02 NOTE — Telephone Encounter (Signed)
Referral to [facility] pending [employer] authorization-Toni

## 2023-01-02 NOTE — Progress Notes (Signed)
[facility] [place], [place] [address]  Internal MEDICINE  Office Visit Note  Patient Name: Jaclyn Gray  [id number]  [id number]  Date of Service: 01/02/2023  Chief Complaint  Patient presents with   Acute Visit     HPI Jaclyn Gray presents for an acute sick visit for abdominal cramping, persistent diarrhea, migraines and wants to get labs done and check out why she has arthritis in her hands Persistent Abdominal pain and cramping, diarrhea  Migraines -- nausea, sensitive to light and sound.  Arthritis of DIP -- wants labs ordered Wants routine labs ordered    Current Medication:  Outpatient Encounter Medications as of 01/02/2023  Medication Sig   acetaminophen (TYLENOL) 500 MG tablet Take 2 tablets (1,000 mg total) by mouth every 6 (six) hours as needed.   ibuprofen (ADVIL) 600 MG tablet Take 1 tablet (600 mg total) by mouth every 6 (six) hours as needed.   Prenatal Vit-Fe Fumarate-FA (MULTIVITAMIN-PRENATAL) 27-0.8 MG TABS tablet Take 1 tablet by mouth daily at 12 noon.   promethazine (PHENERGAN) 12.5 MG tablet Take 1 tablet (12.5 mg total) by mouth every 8 (eight) hours as needed for nausea or vomiting.   triamcinolone cream (KENALOG) 0.1 % Apply 1 Application topically 2 (two) times daily. To affected area   No facility-administered encounter medications on file as of 01/02/2023.      Medical History: Past Medical History:  Diagnosis Date   ASCUS with positive high risk HPV cervical on 07/21/2020 07/29/2020   At risk for fertility problems 08/02/2019   Generalized anxiety disorder 05/24/2020   Gestational hypertension 02/10/2021   Low back pain 05/07/2017   Uterine leiomyoma 10/26/2020   At anatomy u/s 09/15/20:  Myomas  Site                     L(cm)      W(cm)      D(cm)       Location  Posterior                5.7        4.7        5.4         Intramural  Posterior                3.5        3.7        4           Intramural  Posterior                 3.3        2.6        3.2         Intramural   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     Vital Signs: BP 115/74   Pulse 70   Temp 98.3 F (36.8 C)   Resp 16   Ht '5\' 1"'$  (1.549 m)   Wt 151 lb 12.8 oz (68.9 kg)   SpO2 99%   BMI 28.68 kg/m    Review of Systems  Constitutional:  Positive for appetite change and chills. Negative for fatigue and fever (feeling feverish).  HENT: Negative.    Respiratory: Negative.  Negative for cough, chest tightness, shortness of breath and wheezing.   Cardiovascular: Negative.  Negative for chest pain and palpitations.  Gastrointestinal:  Positive for abdominal pain, diarrhea, nausea and vomiting. Negative for constipation.  Musculoskeletal: Negative.  Neurological:  Positive for headaches.    Physical Exam Vitals reviewed.  Constitutional:      General: She is not in acute distress.    Appearance: Normal appearance. She is not ill-appearing.  HENT:     Head: Normocephalic and atraumatic.  Eyes:     Pupils: Pupils are equal, round, and reactive to light.  Cardiovascular:     Rate and Rhythm: Normal rate and regular rhythm.     Heart sounds: Normal heart sounds. No murmur heard. Pulmonary:     Effort: Pulmonary effort is normal. No respiratory distress.     Breath sounds: Normal breath sounds. No wheezing.  Neurological:     Mental Status: She is alert and oriented to person, place, and time.  Psychiatric:        Mood and Affect: Mood normal.        Behavior: Behavior normal.       Assessment/Plan: 1. Abdominal cramping Referred to GI - Ambulatory referral to Gastroenterology  2. Diarrhea, unspecified type Referred to GI - Ambulatory referral to Gastroenterology  3. 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 Migraine with aura and without status migrainosus, not intractable Routine lab ordered. Declined offer for prescription medication - TSH + free T4  6. Mixed hyperlipidemia Routine labs ordered - CBC with Differential/Platelet - CMP14+EGFR - Lipid Profile - TSH + free T4  7. Vitamin D deficiency Routine labs ordered - Vitamin D (25 hydroxy) - TSH + free T4  8. B12 deficiency Routine labs ordered - CBC with Differential/Platelet - CMP14+EGFR - Lipid Profile - B12 and Folate Panel  9. Dysuria Urinalysis negative for UTI - POCT Urinalysis Dipstick   General Counseling: Karalina verbalizes understanding of the findings of todays visit and agrees with plan of treatment. I have discussed any further diagnostic evaluation that may be needed or ordered today. We also reviewed her medications today. she has been encouraged to call the office with any questions or concerns that should arise related to todays visit.    Counseling:    Orders Placed This Encounter  Procedures   CBC with Differential/Platelet   CMP14+EGFR   Lipid Profile   Vitamin D (25 hydroxy)   B12 and Folate Panel   TSH + free T4   Sed Rate (ESR)   C-reactive protein   ANA Direct w/Reflex if Positive   Rheumatoid Factor   Ambulatory referral to Gastroenterology   POCT Urinalysis Dipstick    No orders of the defined types were placed in this encounter.   Return if symptoms worsen or fail to improve, for and will call with lab results .  [place] Controlled Substance Database was reviewed by me for overdose risk score (ORS)  Time spent:30 Minutes Time spent with patient included reviewing progress notes, labs, imaging studies, and discussing plan for follow up.   This patient was seen by  [name], FNP-C in collaboration with Dr. [name] as a part of collaborative care agreement.  Negan Grudzien R. Valetta Fuller, MSN, FNP-C Internal Medicine

## 2023-01-04 LAB — CBC WITH DIFFERENTIAL/PLATELET
Basophils Absolute: 0.1 10*3/uL (ref 0.0–0.2)
Basos: 1 %
EOS (ABSOLUTE): 0 10*3/uL (ref 0.0–0.4)
Eos: 1 %
Hematocrit: 39.8 % (ref 34.0–46.6)
Hemoglobin: 13.6 g/dL (ref 11.1–15.9)
Immature Grans (Abs): 0 10*3/uL (ref 0.0–0.1)
Immature Granulocytes: 0 %
Lymphocytes Absolute: 1.8 10*3/uL (ref 0.7–3.1)
Lymphs: 36 %
MCH: 31.6 pg (ref 26.6–33.0)
MCHC: 34.2 g/dL (ref 31.5–35.7)
MCV: 92 fL (ref 79–97)
Monocytes Absolute: 0.3 10*3/uL (ref 0.1–0.9)
Monocytes: 7 %
Neutrophils Absolute: 2.8 10*3/uL (ref 1.4–7.0)
Neutrophils: 55 %
Platelets: 244 10*3/uL (ref 150–450)
RBC: 4.31 x10E6/uL (ref 3.77–5.28)
RDW: 11.5 % — ABNORMAL LOW (ref 11.7–15.4)
WBC: 4.9 10*3/uL (ref 3.4–10.8)

## 2023-01-04 LAB — LIPID PANEL
Chol/HDL Ratio: 2.8 ratio (ref 0.0–4.4)
Cholesterol, Total: 167 mg/dL (ref 100–199)
HDL: 60 mg/dL (ref >39)
LDL Chol Calc (NIH): 99 mg/dL (ref 0–99)
Triglycerides: 36 mg/dL (ref 0–149)
VLDL Cholesterol Cal: 8 mg/dL (ref 5–40)

## 2023-01-04 LAB — CMP14+EGFR
ALT: 14 IU/L (ref 0–32)
AST: 17 IU/L (ref 0–40)
Albumin/Globulin Ratio: 1.6 (ref 1.2–2.2)
Albumin: 4.1 g/dL (ref 3.9–4.9)
Alkaline Phosphatase: 60 IU/L (ref 44–121)
BUN/Creatinine Ratio: 13 (ref 9–23)
BUN: 10 mg/dL (ref 6–24)
Bilirubin Total: 0.3 mg/dL (ref 0.0–1.2)
CO2: 23 mmol/L (ref 20–29)
Calcium: 8.9 mg/dL (ref 8.7–10.2)
Chloride: 106 mmol/L (ref 96–106)
Creatinine, Ser: 0.76 mg/dL (ref 0.57–1.00)
Globulin, Total: 2.6 g/dL (ref 1.5–4.5)
Glucose: 77 mg/dL (ref 70–99)
Potassium: 4.1 mmol/L (ref 3.5–5.2)
Sodium: 143 mmol/L (ref 134–144)
Total Protein: 6.7 g/dL (ref 6.0–8.5)
eGFR: 102 mL/min/{1.73_m2} (ref >59)

## 2023-01-04 LAB — B12 AND FOLATE PANEL
Folate: 17.3 ng/mL (ref >3.0)
Vitamin B-12: 418 pg/mL (ref 232–1245)

## 2023-01-04 LAB — SEDIMENTATION RATE: Sed Rate: 8 mm/hr (ref 0–32)

## 2023-01-04 LAB — VITAMIN D 25 HYDROXY (VIT D DEFICIENCY, FRACTURES): Vit D, 25-Hydroxy: 25.6 ng/mL — ABNORMAL LOW (ref 30.0–100.0)

## 2023-01-04 LAB — TSH+FREE T4
Free T4: 0.92 ng/dL (ref 0.82–1.77)
TSH: 1.99 u[IU]/mL (ref 0.450–4.500)

## 2023-01-04 LAB — RHEUMATOID FACTOR: Rheumatoid fact SerPl-aCnc: 15.5 IU/mL — ABNORMAL HIGH (ref <14.0)

## 2023-01-04 LAB — C-REACTIVE PROTEIN: CRP: 1 mg/L (ref 0–10)

## 2023-01-04 LAB — ANA W/REFLEX IF POSITIVE: Anti Nuclear Antibody (ANA): NEGATIVE

## 2023-01-09 ENCOUNTER — Ambulatory Visit (INDEPENDENT_AMBULATORY_CARE_PROVIDER_SITE_OTHER): Admitting: Obstetrics and Gynecology

## 2023-01-09 ENCOUNTER — Encounter: Payer: Self-pay | Admitting: Obstetrics and Gynecology

## 2023-01-09 ENCOUNTER — Other Ambulatory Visit (HOSPITAL_COMMUNITY)
Admission: RE | Admit: 2023-01-09 | Discharge: 2023-01-09 | Disposition: A | Discharge status: Home / Self Care | Source: Ambulatory Visit | Attending: Obstetrics and Gynecology | Admitting: Obstetrics and Gynecology

## 2023-01-09 VITALS — BP 121/79 | HR 80 | Wt 149.0 lb

## 2023-01-09 DIAGNOSIS — R102 Pelvic and perineal pain: Secondary | ICD-10-CM

## 2023-01-09 DIAGNOSIS — Z3202 Encounter for pregnancy test, result negative: Secondary | ICD-10-CM | POA: Diagnosis not present

## 2023-01-09 DIAGNOSIS — Z86018 Personal history of other benign neoplasm: Secondary | ICD-10-CM

## 2023-01-09 LAB — POCT URINALYSIS DIPSTICK
Bilirubin, UA: NEGATIVE
Blood, UA: NEGATIVE
Glucose, UA: NEGATIVE
Ketones, UA: NEGATIVE
Nitrite, UA: NEGATIVE
Protein, UA: NEGATIVE
Spec Grav, UA: 1.015
Urobilinogen, UA: 0.2 U/dL
pH, UA: 5

## 2023-01-09 LAB — POCT URINE PREGNANCY: Preg Test, Ur: NEGATIVE

## 2023-01-09 NOTE — Progress Notes (Signed)
  Obstetrics and Gynecology Visit Return Patient Evaluation  Appointment Date: 01/09/2023  Primary Care Provider: [name]  OB[facility]N Clinic: [facility]  Chief Complaint: pelvic and back pain  History of Present Illness:  Jaclyn Gray is a 42 y.o. with above CC. Patient with some pelvic and low back pain in December and feeling some right vulvar fullness currently. No inciting or aggravating event. She has occasional AUB. She is breasfeeding on demand on the weekend and about 3x/day during the weekdays  Review of Systems: as noted in the History of Present Illness.  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 Current Outpatient Medications  Medication Sig Dispense Refill   acetaminophen (TYLENOL) 500 MG tablet Take 2 tablets (1,000 mg total) by mouth every 6 (six) hours as needed. 30 tablet 0   ibuprofen (ADVIL) 600 MG tablet Take 1 tablet (600 mg total) by mouth every 6 (six) hours as needed. 30 tablet 0   Prenatal Vit-Fe Fumarate-FA (MULTIVITAMIN-PRENATAL) 27-0.8 MG TABS tablet Take 1 tablet by mouth daily at 12 noon. 30 tablet 6   promethazine (PHENERGAN)  12.5 MG tablet Take 1 tablet (12.5 mg total) by mouth every 8 (eight) hours as needed for nausea or vomiting. 20 tablet 0   triamcinolone cream (KENALOG) 0.1 % Apply 1 Application topically 2 (two) times daily. To affected area 30 g 1   No current facility-administered medications for this visit.    Allergies: has No Known Allergies.  Physical Exam:  BP 121/79   Pulse 80   Wt 149 lb (67.6 kg)   Breastfeeding Yes   BMI 28.15 kg/m  Body mass index is 28.15 kg/m. General appearance: Well nourished, well developed female in no acute distress.  Abdomen: diffusely non tender to palpation, non distended, and no masses, hernias Neuro/Psych:  Normal mood and affect.    Pelvic exam:  EGBUS: normal Vaginal vault: normal Cervix:  normal Bimanual: negative  UPT: neg UA: leuks  Assessment: pt stable  Plan:  1. Pelvic pain Unsure etiology, but potentially around the time of menses. I told her some AUB is common with breastfeeding and no need for intervention as long as not bothersome/persistent. F/u u/s and swab.  - POCT Urinalysis Dipstick - POCT urine pregnancy - Cervicovaginal ancillary only( [place]) - Urine Culture - US PELVIC COMPLETE WITH TRANSVAGINAL; Future  2. History of uterine fibroid Several small 3cm fibroids at her last OB u/s prior to her delivery in  - US PELVIC COMPLETE WITH TRANSVAGINAL; Future   RTC: PRN  Durene Romans MD Attending [facility] for [facility])

## 2023-01-09 NOTE — Progress Notes (Signed)
RGYN patient here for evaluation of Pelvic Pain and fibroids.   Last Pap:05/18/22 ASC-US Negative HPV

## 2023-01-10 LAB — CERVICOVAGINAL ANCILLARY ONLY
Chlamydia: NEGATIVE
Comment: NEGATIVE
Comment: NEGATIVE
Comment: NORMAL
Neisseria Gonorrhea: NEGATIVE
Trichomonas: NEGATIVE

## 2023-01-11 LAB — URINE CULTURE

## 2023-01-22 ENCOUNTER — Inpatient Hospital Stay: Admission: RE | Admit: 2023-01-22 | Source: Ambulatory Visit

## 2023-01-30 ENCOUNTER — Other Ambulatory Visit: Payer: Self-pay | Admitting: Nurse Practitioner

## 2023-01-30 ENCOUNTER — Telehealth: Payer: Self-pay

## 2023-01-30 DIAGNOSIS — M25552 Pain in left hip: Secondary | ICD-10-CM

## 2023-01-30 DIAGNOSIS — G8929 Other chronic pain: Secondary | ICD-10-CM

## 2023-01-30 DIAGNOSIS — M151 Heberden's nodes (with arthropathy): Secondary | ICD-10-CM

## 2023-01-30 DIAGNOSIS — R768 Other specified abnormal immunological findings in serum: Secondary | ICD-10-CM

## 2023-01-30 NOTE — Telephone Encounter (Signed)
Pt advised for labs result

## 2023-01-30 NOTE — Progress Notes (Signed)
Please call patient with results --elevated rheumatoid factor -- I am referring her to rheumatology --Low vitamin D level -- take OTC supplement 2000 units daily.  All other labs are normal.

## 2023-01-30 NOTE — Telephone Encounter (Signed)
-----   Message from [name], NP sent at 01/30/2023  8:20 AM EST ----- Please call patient with results --elevated rheumatoid factor -- I am referring her to rheumatology --Low vitamin D level -- take OTC supplement 2000 units daily.  All other labs are normal.

## 2023-02-01 ENCOUNTER — Ambulatory Visit
Admission: RE | Admit: 2023-02-01 | Discharge: 2023-02-01 | Disposition: A | Discharge status: Home / Self Care | Source: Ambulatory Visit | Attending: Obstetrics and Gynecology | Admitting: Obstetrics and Gynecology

## 2023-02-01 DIAGNOSIS — R102 Pelvic and perineal pain: Secondary | ICD-10-CM | POA: Diagnosis present

## 2023-02-01 DIAGNOSIS — Z86018 Personal history of other benign neoplasm: Secondary | ICD-10-CM | POA: Insufficient documentation

## 2023-02-06 ENCOUNTER — Telehealth: Payer: Self-pay | Admitting: Nurse Practitioner

## 2023-02-06 NOTE — Telephone Encounter (Signed)
Rheumatology  referral sent via [facility] to [facility]

## 2023-03-13 ENCOUNTER — Encounter: Payer: Self-pay | Admitting: Nurse Practitioner

## 2023-03-13 ENCOUNTER — Ambulatory Visit (INDEPENDENT_AMBULATORY_CARE_PROVIDER_SITE_OTHER): Admitting: Nurse Practitioner

## 2023-03-13 VITALS — BP 120/85 | HR 88 | Temp 98.1°F | Resp 16 | Ht 61.0 in | Wt 149.8 lb

## 2023-03-13 DIAGNOSIS — G43109 Migraine with aura, not intractable, without status migrainosus: Secondary | ICD-10-CM

## 2023-03-13 NOTE — Progress Notes (Signed)
[facility] [place], [place] [address]  Internal MEDICINE  Office Visit Note  Patient Name: Jaclyn Gray  [id number]  [id number]  Date of Service: 03/13/2023  Chief Complaint  Patient presents with   Follow-up   Hypertension    HPI Jaclyn Gray presents for a follow-up visit for migraines.  Has migraines with nausea, and sensitivity to light and sound.  Interested in botox injections to treat migraines.  Has had migraines for a long time.  Has taken OTC medications and has tried sumatriptan. Has not taken anything for prevention and is reluctant to try most medications because she is still breast feeding. 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    Current Medication: Outpatient Encounter Medications as of 03/13/2023  Medication Sig   acetaminophen (TYLENOL) 500 MG tablet Take 2 tablets (1,000 mg total) by mouth every 6 (six) hours as needed.   ibuprofen (ADVIL) 600 MG tablet Take 1 tablet (600 mg total) by mouth every 6 (six) hours as needed.   Prenatal Vit-Fe Fumarate-FA (MULTIVITAMIN-PRENATAL) 27-0.8 MG TABS tablet Take 1 tablet by mouth daily at 12 noon.   promethazine (PHENERGAN) 12.5 MG tablet Take 1 tablet (12.5 mg total) by mouth every 8 (eight) hours as needed for nausea or vomiting.   triamcinolone cream (KENALOG) 0.1 % Apply 1 Application topically 2 (two) times daily. To affected area   No facility-administered encounter medications on file as of 03/13/2023.    Surgical History: Past Surgical History:  Procedure Laterality Date   BUNIONECTOMY Right 04/21/2022   WISDOM TOOTH EXTRACTION     x4 extracted     Medical History: Past Medical History:  Diagnosis Date   ASCUS with positive high risk HPV cervical on 07/21/2020 07/29/2020   At risk for fertility problems 08/02/2019   Generalized anxiety disorder 05/24/2020   Gestational hypertension  02/10/2021   Low back pain 05/07/2017   Uterine leiomyoma 10/26/2020   At anatomy u/s 09/15/20:  Myomas  Site                     L(cm)      W(cm)      D(cm)       Location  Posterior                5.7        4.7        5.4         Intramural  Posterior                3.5        3.7        4           Intramural  Posterior                3.3        2.6        3.2         Intramural   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    Family History: Family History  Problem Relation Age of Onset   Hypertension Mother    Breast cancer Mother 53   Prostate cancer Father    Hypertension Father    Diabetes Father    Throat cancer Maternal Grandmother    Colon cancer Maternal Grandmother    Lung cancer Maternal Grandfather  Social History   Socioeconomic History   Marital status: Single    Spouse name: Not on file   Number of children: Not on file   Years of education: Not on file   Highest education level: Not on file  Occupational History   Occupation: Nature conservation officer  Tobacco Use   Smoking status: Never    Passive exposure: Never   Smokeless tobacco: Never  Vaping Use   Vaping Use: Never used  Substance and Sexual Activity   Alcohol use: Not Currently    Comment: social    Drug use: Never   Sexual activity: Not Currently    Birth control/protection: None  Other Topics Concern   Not on file  Social History Narrative   Not on file   Social Determinants of Health   Financial Resource Strain: Not on file  Food Insecurity: Not on file  Transportation Needs: Not on file  Physical Activity: Not on file  Stress: Not on file  Social Connections: Not on file  Intimate Partner Violence: Not on file      Review of Systems  Constitutional:  Positive for appetite change and chills. Negative for fatigue and fever (feeling feverish).  HENT: Negative.    Respiratory: Negative.  Negative for cough, chest tightness, shortness of breath and wheezing.    Cardiovascular: Negative.  Negative for chest pain and palpitations.  Gastrointestinal:  Positive for abdominal pain, diarrhea, nausea and vomiting. Negative for constipation.  Musculoskeletal: Negative.   Neurological:  Positive for headaches.    Vital Signs: BP 120/85   Pulse 88   Temp 98.1 F (36.7 C)   Resp 16   Ht 5\' 1"  (1.549 m)   Wt 149 lb 12.8 oz (67.9 kg)   SpO2 96%   BMI 28.30 kg/m    Physical Exam Vitals reviewed.  Constitutional:      General: She is not in acute distress.    Appearance: Normal appearance. She is not ill-appearing.  HENT:     Head: Normocephalic and atraumatic.  Eyes:     Pupils: Pupils are equal, round, and reactive to light.  Cardiovascular:     Rate and Rhythm: Normal rate and regular rhythm.     Heart sounds: Normal heart sounds. No murmur heard. Pulmonary:     Effort: Pulmonary effort is normal. No respiratory distress.     Breath sounds: Normal breath sounds. No wheezing.  Neurological:     Mental Status: She is alert and oriented to person, place, and time.  Psychiatric:        Mood and Affect: Mood normal.        Behavior: Behavior normal.        Assessment/Plan: 1. Migraine with aura and without status migrainosus, not intractable Referred to neurology for further evaluation.  - Ambulatory referral to Neurology   General Counseling: Jaclyn Gray verbalizes understanding of the findings of todays visit and agrees with plan of treatment. I have discussed any further diagnostic evaluation that may be needed or ordered today. We also reviewed her medications today. she has been encouraged to call the office with any questions or concerns that should arise related to todays visit.    Orders Placed This Encounter  Procedures   Ambulatory referral to Neurology    No orders of the defined types were placed in this encounter.   Return if symptoms worsen or fail to improve.   Total time spent:20 Minutes Time spent includes  review of chart, medications, test results, and follow  up plan with the patient.   [place] Controlled Substance Database was reviewed by me.  This patient was seen by [name], FNP-C in collaboration with Dr. [name] as a part of collaborative care agreement.   Elaina Cara R. Valetta Fuller, MSN, FNP-C Internal medicine

## 2023-03-16 ENCOUNTER — Telehealth: Payer: Self-pay | Admitting: *Deleted

## 2023-03-16 NOTE — Telephone Encounter (Signed)
error 

## 2023-03-20 ENCOUNTER — Telehealth: Payer: Self-pay | Admitting: Nurse Practitioner

## 2023-03-20 NOTE — Telephone Encounter (Signed)
[employer] referral authorization pending for neurology-Toni

## 2023-03-22 ENCOUNTER — Telehealth: Payer: Self-pay | Admitting: Nurse Practitioner

## 2023-03-22 NOTE — Telephone Encounter (Signed)
Neurology referral sent via [employer] to Dr. Manuella Ghazi with [facility]-Toni

## 2023-03-27 ENCOUNTER — Ambulatory Visit (INDEPENDENT_AMBULATORY_CARE_PROVIDER_SITE_OTHER)

## 2023-03-27 DIAGNOSIS — L603 Nail dystrophy: Secondary | ICD-10-CM

## 2023-03-27 NOTE — Progress Notes (Signed)
Patient presents today for the 3rd laser treatment. 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    Toenail most affected right 5th toe. Patient nails has improved significantly. Toenails are no longer thick or discolored at this time. Advised patient to monitor the toenails for any changes, if changes are noted patient to call the office to schedule next laser treatment if they remain clear patient will follow-up with Dr. Milinda Pointer in 3 months. Patient verbalized understanding directions. Was unable to obtain pictures at this visit.     All other systems are negative.   Nails were filed thin. Laser therapy was administered to 1-5 toenails right foot and patient tolerated the treatment well. All safety precautions were in place.      Follow up with Dr. Milinda Pointer 2 weeks.

## 2023-03-28 ENCOUNTER — Telehealth: Payer: Self-pay | Admitting: Nurse Practitioner

## 2023-03-28 NOTE — Telephone Encounter (Signed)
Rheumatology referral closed by [facility] due to patient not returning call to schedule-Toni

## 2023-03-30 ENCOUNTER — Ambulatory Visit (HOSPITAL_COMMUNITY)
Admission: EM | Admit: 2023-03-30 | Discharge: 2023-03-30 | Disposition: A | Discharge status: Home / Self Care | Attending: Urgent Care | Admitting: Urgent Care

## 2023-03-30 ENCOUNTER — Encounter (HOSPITAL_COMMUNITY): Payer: Self-pay

## 2023-03-30 DIAGNOSIS — R0789 Other chest pain: Secondary | ICD-10-CM

## 2023-03-30 MED ORDER — NAPROXEN 375 MG PO TABS: 375.0000 mg | ORAL_TABLET | Freq: Two times a day (BID) | ORAL | 0 refills | Status: AC

## 2023-03-30 MED ORDER — CYCLOBENZAPRINE HCL 5 MG PO TABS: 5.0000 mg | ORAL_TABLET | Freq: Three times a day (TID) | ORAL | 0 refills | Status: DC | PRN

## 2023-03-30 NOTE — ED Provider Notes (Signed)
[facility]   MRN: [id number] DOB: [birth year]  Subjective:   Jaclyn AnisChristina Gray is a 42 y.o. female presenting for 1 day history of acute onset persistent midsternal chest pain, internal chest pain, shortness of breath, dizziness, heart racing.  Reports that her pulse was sustained at 106bpm, 107bpm overnight.  Denies fever, runny or stuffy nose, sore throat, cough, active shortness of breath, nausea, vomiting, active abdominal pain.  No history of cardiac disorders.  No drug use.  No smoking.  No diabetes, hypertension, dyslipidemia.  No family history of sudden cardiac death.  Patient does not do any heavy lifting, strenuous physical exercise.  No trauma to the chest or abdomen.  Patient does work for [employer] and despite having a sedentary job she experiences significant stress from work.  She does have a PCP that she can follow-up with.  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  No current facility-administered medications for this encounter.  Current Outpatient Medications:    acetaminophen (TYLENOL) 500 MG tablet, Take 2 tablets (1,000 mg total) by mouth every 6 (six) hours as needed., Disp: 30 tablet, Rfl: 0   ibuprofen (ADVIL) 600 MG tablet, Take 1 tablet (600 mg total) by mouth every 6 (six) hours as needed., Disp: 30 tablet, Rfl: 0   Prenatal Vit-Fe Fumarate-FA (MULTIVITAMIN-PRENATAL) 27-0.8 MG TABS tablet, Take 1 tablet by mouth daily at 12 noon., Disp: 30 tablet, Rfl: 6   promethazine (PHENERGAN) 12.5 MG tablet, Take 1 tablet (12.5 mg total) by mouth every 8 (eight) hours as needed for nausea or vomiting., Disp: 20 tablet, Rfl: 0   triamcinolone cream (KENALOG) 0.1 %, Apply 1 Application topically 2 (two) times daily. To affected area, Disp: 30 g, Rfl: 1   No Known Allergies  Past Medical History:  Diagnosis Date   ASCUS with positive high risk HPV cervical on 07/21/2020 07/29/2020   At risk for fertility  problems 08/02/2019   Generalized anxiety disorder 05/24/2020   Gestational hypertension 02/10/2021   Low back pain 05/07/2017   Uterine leiomyoma 10/26/2020   At anatomy u/s 09/15/20:  Myomas  Site                     L(cm)      W(cm)      D(cm)       Location  Posterior                5.7        4.7        5.4         Intramural  Posterior                3.5        3.7        4           Intramural  Posterior                3.3        2.6        3.2         Intramural   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     Past Surgical History:  Procedure Laterality Date   BUNIONECTOMY Right 04/21/2022   WISDOM TOOTH EXTRACTION     x4 extracted     Family History  Problem Relation Age of Onset   Hypertension Mother  Breast cancer Mother 50   Prostate cancer Father    Hypertension Father    Diabetes Father    Throat cancer Maternal Grandmother    Colon cancer Maternal Grandmother    Lung cancer Maternal Grandfather     Social History   Tobacco Use   Smoking status: Never    Passive exposure: Never   Smokeless tobacco: Never  Vaping Use   Vaping Use: Never used  Substance Use Topics   Alcohol use: Not Currently    Comment: social    Drug use: Never    ROS   Objective:   Vitals: There were no vitals taken for this visit.  Physical Exam Constitutional:      General: She is not in acute distress.    Appearance: Normal appearance. She is well-developed. She is not ill-appearing, toxic-appearing or diaphoretic.  HENT:     Head: Normocephalic and atraumatic.     Nose: Nose normal.     Mouth/Throat:     Mouth: Mucous membranes are moist.  Eyes:     General: No scleral icterus.       Right eye: No discharge.        Left eye: No discharge.     Extraocular Movements: Extraocular movements intact.     Conjunctiva/sclera: Conjunctivae normal.  Cardiovascular:     Rate and Rhythm: Normal rate and regular rhythm.     Heart sounds: Normal heart  sounds. No murmur heard.    No friction rub. No gallop.  Pulmonary:     Effort: Pulmonary effort is normal. No respiratory distress.     Breath sounds: No stridor. No wheezing, rhonchi or rales.  Chest:     Chest wall: Tenderness (reproducible over the mid to upper sternum) present.  Abdominal:     General: Bowel sounds are normal. There is no distension.     Palpations: Abdomen is soft. There is no mass.     Tenderness: There is no abdominal tenderness. There is no right CVA tenderness, left CVA tenderness, guarding or rebound.  Skin:    General: Skin is warm and dry.  Neurological:     General: No focal deficit present.     Mental Status: She is alert and oriented to person, place, and time.  Psychiatric:        Mood and Affect: Mood normal.        Behavior: Behavior normal.        Thought Content: Thought content normal.        Judgment: Judgment normal.     ED ECG REPORT   Date: 03/30/2023  Rate: 74bpm  Rhythm: normal sinus rhythm  QRS Axis: normal  Intervals: normal  ST/T Wave abnormalities: normal  Conduction Disutrbances:none  Narrative Interpretation: Sinus rhythm at 74 bpm without any acute findings.  Old EKG Reviewed: none available  I have personally reviewed the EKG tracing and agree with the computerized printout as noted.   Assessment and Plan :   PDMP not reviewed this encounter.  1. Atypical chest pain   2. Chest wall pain    Patient has minimal cardiac risk factors and low risk factors for pulmonary embolism.  Vital signs hemodynamically stable.  Her chest pain is reproducible and therefore recommended managing for musculoskeletal pain such as idiopathic costochondritis.  Start naproxen and Flexeril.  Offered a chest x-ray but patient declined and I am in agreement as she has a clear cardiopulmonary exam.  I also advised that her work stress is  also playing a role in her symptoms.  Recommended revisiting this with her PCP and consideration for further  cardiac workup. Counseled patient on potential for adverse effects with medications prescribed/recommended today, ER and return-to-clinic precautions discussed, patient verbalized understanding.    Wallis BambergMani, Alieyah Spader, [place] JerseyPA-C 03/30/23 1109

## 2023-03-30 NOTE — ED Triage Notes (Signed)
Chest pain onset 3 am this morning. Woke up with chest pain, dizziness, heart racing, abdominal pain, and SOB. Lasted for a hour. Heart rate 107 when laying down.

## 2023-03-30 NOTE — Discharge Instructions (Addendum)
Follow up with the PCP and see if they would like to refer you for a consultation with a cardiologist. In the meantime, use naproxen for pain and inflammation. You can use cyclobenzaprine for a muscle relaxing properties. It can make you sleepy, so if that happens then use it at bedtime only.

## 2023-04-02 ENCOUNTER — Ambulatory Visit (INDEPENDENT_AMBULATORY_CARE_PROVIDER_SITE_OTHER): Payer: Self-pay | Admitting: Nurse Practitioner

## 2023-04-02 ENCOUNTER — Encounter: Payer: Self-pay | Admitting: Nurse Practitioner

## 2023-04-02 VITALS — BP 133/83 | HR 92 | Temp 98.4°F | Resp 16 | Ht 61.0 in | Wt 153.6 lb

## 2023-04-02 DIAGNOSIS — R0789 Other chest pain: Secondary | ICD-10-CM

## 2023-04-02 DIAGNOSIS — R0602 Shortness of breath: Secondary | ICD-10-CM

## 2023-04-02 DIAGNOSIS — R079 Chest pain, unspecified: Secondary | ICD-10-CM

## 2023-04-02 MED ORDER — NITROGLYCERIN 0.4 MG SL SUBL
0.4000 mg | SUBLINGUAL_TABLET | SUBLINGUAL | 3 refills | Status: DC | PRN
Start: 2023-04-02 — End: 2024-04-02

## 2023-04-02 NOTE — Progress Notes (Signed)
[facility] [address] [place], [place] [address]  Internal MEDICINE  Office Visit Note  Patient Name: Jaclyn Gray  [id number]  [id number]  Date of Service: 04/02/2023  Chief Complaint  Patient presents with   Hypertension   Follow-up    HPI Jaclyn Gray presents for a follow-up visit for EKG was normal in urgent care Went to urgent care of atypical chest pain-- as well as SOB, dizziness, racing heart beat.  HR normal today. BP ok.  Pain lasted for 12 hours on Friday  Pain today has lasted 1.5 hours so far today, describes the severity as moderate.  Describes as a squeezing pain, denies any pressure or sharp pains. Still breastfeeding her 56 year old daughter on demand.     Current Medication: Outpatient Encounter Medications as of 04/02/2023  Medication Sig   acetaminophen (TYLENOL) 500 MG tablet Take 2 tablets (1,000 mg total) by mouth every 6 (six) hours as needed.   cyclobenzaprine (FLEXERIL) 5 MG tablet Take 1 tablet (5 mg total) by mouth 3 (three) times daily as needed for muscle spasms.   ibuprofen (ADVIL) 600 MG tablet Take 1 tablet (600 mg total) by mouth every 6 (six) hours as needed.   naproxen (NAPROSYN) 375 MG tablet Take 1 tablet (375 mg total) by mouth 2 (two) times daily with a meal.   nitroGLYCERIN (NITROSTAT) 0.4 MG SL tablet Place 1 tablet (0.4 mg total) under the tongue every 5 (five) minutes as needed for chest pain. X3 doses. If no relief after 3rd dose, call 911 or go to ER.   Prenatal Vit-Fe Fumarate-FA (MULTIVITAMIN-PRENATAL) 27-0.8 MG TABS tablet Take 1 tablet by mouth daily at 12 noon.   promethazine (PHENERGAN) 12.5 MG tablet Take 1 tablet (12.5 mg total) by mouth every 8 (eight) hours as needed for nausea or vomiting.   triamcinolone cream (KENALOG) 0.1 % Apply 1 Application topically 2 (two) times daily. To affected area   No facility-administered encounter medications on file as of 04/02/2023.    Surgical History: Past Surgical  History:  Procedure Laterality Date   BUNIONECTOMY Right 04/21/2022   WISDOM TOOTH EXTRACTION     x4 extracted     Medical History: Past Medical History:  Diagnosis Date   ASCUS with positive high risk HPV cervical on 07/21/2020 07/29/2020   At risk for fertility problems 08/02/2019   Generalized anxiety disorder 05/24/2020   Gestational hypertension 02/10/2021   Low back pain 05/07/2017   Uterine leiomyoma 10/26/2020   At anatomy u/s 09/15/20:  Myomas  Site                     L(cm)      W(cm)      D(cm)       Location  Posterior                5.7        4.7        5.4         Intramural  Posterior                3.5        3.7        4           Intramural  Posterior                3.3        2.6        3.2  Intramural   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    Family History: Family History  Problem Relation Age of Onset   Hypertension Mother    Breast cancer Mother 28   Prostate cancer Father    Hypertension Father    Diabetes Father    Throat cancer Maternal Grandmother    Colon cancer Maternal Grandmother    Lung cancer Maternal Grandfather     Social History   Socioeconomic History   Marital status: Single    Spouse name: Not on file   Number of children: Not on file   Years of education: Not on file   Highest education level: Not on file  Occupational History   Occupation: Hotel manager  Tobacco Use   Smoking status: Never    Passive exposure: Never   Smokeless tobacco: Never  Vaping Use   Vaping Use: Never used  Substance and Sexual Activity   Alcohol use: Not Currently    Comment: social    Drug use: Never   Sexual activity: Not Currently    Birth control/protection: None  Other Topics Concern   Not on file  Social History Narrative   Not on file   Social Determinants of Health   Financial Resource Strain: Not on file  Food Insecurity: Not on file  Transportation Needs: Not on file  Physical Activity: Not on file   Stress: Not on file  Social Connections: Not on file  Intimate Partner Violence: Not on file      Review of Systems  Constitutional:  Negative for appetite change, fatigue and fever.  HENT: Negative.    Respiratory:  Positive for shortness of breath. Negative for cough, chest tightness and wheezing.   Cardiovascular:  Positive for chest pain and palpitations.  Gastrointestinal: Negative.   Genitourinary: Negative.   Musculoskeletal: Negative.     Vital Signs: BP 133/83   Pulse 92   Temp 98.4 F (36.9 C)   Resp 16   Ht 5\' 1"  (1.549 m)   Wt 153 lb 9.6 oz (69.7 kg)   LMP 03/29/2023 (Exact Date)   SpO2 98%   BMI 29.02 kg/m    Physical Exam Vitals reviewed.  Constitutional:      General: She is not in acute distress.    Appearance: Normal appearance. She is not ill-appearing.  HENT:     Head: Normocephalic and atraumatic.  Eyes:     Pupils: Pupils are equal, round, and reactive to light.  Cardiovascular:     Rate and Rhythm: Normal rate and regular rhythm.     Heart sounds: Normal heart sounds. No murmur heard. Pulmonary:     Effort: Pulmonary effort is normal. No respiratory distress.     Breath sounds: Normal breath sounds. No wheezing.  Neurological:     Mental Status: She is alert and oriented to person, place, and time.  Psychiatric:        Mood and Affect: Mood normal.        Behavior: Behavior normal.        Assessment/Plan: 1. Atypical chest pain *** - LONG TERM MONITOR (3-14 DAYS); Future - ECHOCARDIOGRAM COMPLETE; Future  2. Chest pain with normal EKG *** - LONG TERM MONITOR (3-14 DAYS); Future - ECHOCARDIOGRAM COMPLETE; Future  3. SOB (shortness of breath) *** - LONG TERM MONITOR (3-14 DAYS); Future - ECHOCARDIOGRAM COMPLETE; Future,  General Counseling: [name] verbalizes understanding of the findings of todays visit and agrees with plan of treatment. I have discussed  any further diagnostic evaluation that may be needed or ordered  today. We also reviewed her medications today. she has been encouraged to call the office with any questions or concerns that should arise related to todays visit.    Orders Placed This Encounter  Procedures   LONG TERM MONITOR (3-14 DAYS)   ECHOCARDIOGRAM COMPLETE    Meds ordered this encounter  Medications   nitroGLYCERIN (NITROSTAT) 0.4 MG SL tablet    Sig: Place 1 tablet (0.4 mg total) under the tongue every 5 (five) minutes as needed for chest pain. X3 doses. If no relief after 3rd dose, call 911 or go to ER.    Dispense:  30 tablet    Refill:  3    Return for will discuss follow up with patient after echo and zio monitor are done.   Total time spent:30 Minutes Time spent includes review of chart, medications, test results, and follow up plan with the patient.   [place] Controlled Substance Database was reviewed by me.  This patient was seen by Sallyanne KusterAlyssa Eliezer Khawaja, FNP-C in collaboration with Dr. Beverely RisenFozia Khan as a part of collaborative care agreement.   Branch Pacitti R. Tedd SiasAbernathy, MSN, FNP-C Internal medicine

## 2023-04-03 ENCOUNTER — Telehealth: Payer: Self-pay | Admitting: Nurse Practitioner

## 2023-04-03 ENCOUNTER — Ambulatory Visit: Admitting: Podiatry

## 2023-04-03 ENCOUNTER — Telehealth: Payer: Self-pay

## 2023-04-03 ENCOUNTER — Other Ambulatory Visit: Payer: Self-pay

## 2023-04-03 DIAGNOSIS — R0789 Other chest pain: Secondary | ICD-10-CM

## 2023-04-03 DIAGNOSIS — R079 Chest pain, unspecified: Secondary | ICD-10-CM

## 2023-04-03 DIAGNOSIS — R0602 Shortness of breath: Secondary | ICD-10-CM

## 2023-04-03 NOTE — Telephone Encounter (Signed)
Notified patient of echo appointment date, check-in time and location-Toni

## 2023-04-03 NOTE — Telephone Encounter (Signed)
Spoke with zio they will mailed Zio monitor to PT

## 2023-04-12 ENCOUNTER — Ambulatory Visit (INDEPENDENT_AMBULATORY_CARE_PROVIDER_SITE_OTHER)

## 2023-04-12 ENCOUNTER — Ambulatory Visit (INDEPENDENT_AMBULATORY_CARE_PROVIDER_SITE_OTHER): Admitting: Podiatry

## 2023-04-12 ENCOUNTER — Encounter: Payer: Self-pay | Admitting: Podiatry

## 2023-04-12 DIAGNOSIS — M2011 Hallux valgus (acquired), right foot: Secondary | ICD-10-CM

## 2023-04-12 DIAGNOSIS — M87374 Other secondary osteonecrosis, right foot: Secondary | ICD-10-CM | POA: Diagnosis not present

## 2023-04-12 NOTE — Progress Notes (Signed)
She presents today date of surgery 04/21/2022 states that is been a year now and my foot is still painful.  She has exhausted physical therapy different shoes and inserts etc. all failed to alleviate symptomatology of the foot.  She does relate a recent diagnosis of rheumatoid arthritis.  Has yet to be seen by rheumatology.  Objective: Vital signs stable alert oriented x 3 there is no erythema edema salines drainage odor she has better range of motion of the toes except dorsiflexion is limited.  Radiographs taken today demonstrate joint space narrowing moderate to severe subchondral sclerosis and a lateral cyst at the level of the subchondral bone laterally.  Hallux interphalangeal joint does not demonstrate any type of breakdown there does appear to be some soft tissue swelling around the metatarsophalangeal joint possibly associated with pannus or possibly associated with osteonecrosis dissecans.  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  Plan: Discussed etiology pathology conservative surgical therapies at this point I think is fair to ask for CT to evaluate the joint once again.  MRI may be more beneficial however I am concerned about metallic interference from the screw.  We did discuss the possible need for further surgery including fusion of the toe for a metatarsophalangeal joint Keller type arthroplasty with a single silicone implant she stands that and is amenable to it.  May need to consider a full panel for rheumatological workup since she did not have 1 from her primary care.  This will be necessary if she has a yet to see rheumatology.

## 2023-04-14 ENCOUNTER — Encounter: Payer: Self-pay | Admitting: Nurse Practitioner

## 2023-04-14 DIAGNOSIS — R768 Other specified abnormal immunological findings in serum: Secondary | ICD-10-CM

## 2023-04-14 DIAGNOSIS — M87874 Other osteonecrosis, right foot: Secondary | ICD-10-CM

## 2023-04-16 DIAGNOSIS — M87874 Other osteonecrosis, right foot: Secondary | ICD-10-CM | POA: Insufficient documentation

## 2023-04-16 DIAGNOSIS — R768 Other specified abnormal immunological findings in serum: Secondary | ICD-10-CM | POA: Insufficient documentation

## 2023-04-18 ENCOUNTER — Ambulatory Visit (INDEPENDENT_AMBULATORY_CARE_PROVIDER_SITE_OTHER): Admitting: Gastroenterology

## 2023-04-18 ENCOUNTER — Encounter: Payer: Self-pay | Admitting: Gastroenterology

## 2023-04-18 VITALS — BP 115/78 | HR 77 | Temp 98.9°F | Ht 61.0 in | Wt 150.0 lb

## 2023-04-18 DIAGNOSIS — K580 Irritable bowel syndrome with diarrhea: Secondary | ICD-10-CM

## 2023-04-18 MED ORDER — AMITRIPTYLINE HCL 25 MG PO TABS: 25.0000 mg | ORAL_TABLET | Freq: Every day | ORAL | 0 refills | Status: DC

## 2023-04-18 NOTE — Progress Notes (Signed)
Arlyss Repress, MD [address]  [address]  [place], [place] [address]  Main: [phone]  Fax: [phone]  Gastroenterology Consultation  Referring Provider:     Sallyanne Kuster, NP Primary Care Physician:  Sallyanne Kuster, NP Primary Gastroenterologist:  Dr. Arlyss Repress Reason for Consultation: Intermittent vomiting, abdominal cramps and loose stools        HPI:   Jaclyn Gray is a 42 y.o. female referred by Sallyanne Kuster, NP  for consultation & management of approximately 47-month history of intermittent episodes of vomiting, lower abdominal cramps and nonbloody diarrhea.  She reports that her 35-year-old daughter had gastroenteritis in January, since then she has been experiencing these symptoms.  She reports experiencing lower abdominal cramps and a bowel movement which she is runny about once or twice weekly.  Also, experiences vomiting which is not associated with abdominal cramps and loose stools.  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  She does report lack of sleep.  She underwent evaluation by OB/GYN including ultrasound of pelvis, which revealed multiple small uterine leiomyomata, otherwise unremarkable No evidence of anemia Does not smoke or drink alcohol, her weight has been stable She does report her mother undergoing colonoscopies frequently for history of polyps and grandmother with colon cancer   NSAIDs: None  Antiplts/Anticoagulants/Anti thrombotics: None  GI Procedures: None  Past Medical History:  Diagnosis Date   ASCUS with positive high risk HPV cervical on 07/21/2020 07/29/2020   At risk for fertility problems 08/02/2019   Generalized anxiety disorder 05/24/2020   Gestational hypertension 02/10/2021   Low back pain 05/07/2017   Uterine leiomyoma 10/26/2020   At anatomy u/s 09/15/20:  Myomas  Site                     L(cm)      W(cm)      D(cm)       Location  Posterior                5.7        4.7        5.4          Intramural  Posterior                3.5        3.7        4           Intramural  Posterior                3.3        2.6        3.2         Intramural   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    Past Surgical History:  Procedure Laterality Date   BUNIONECTOMY Right 04/21/2022   WISDOM TOOTH EXTRACTION     x4 extracted      Current Outpatient Medications:    acetaminophen (TYLENOL) 500 MG tablet, Take 2 tablets (1,000 mg total) by mouth every 6 (six) hours as needed., Disp: 30 tablet, Rfl: 0   amitriptyline (ELAVIL) 25 MG tablet, Take 1 tablet (25 mg total) by mouth at bedtime., Disp: 30 tablet, Rfl: 0   cyclobenzaprine (FLEXERIL) 5 MG tablet, Take 1 tablet (5 mg total) by mouth 3 (three) times daily as needed for muscle spasms., Disp: 30 tablet, Rfl: 0   ibuprofen (ADVIL) 600 MG tablet, Take  1 tablet (600 mg total) by mouth every 6 (six) hours as needed., Disp: 30 tablet, Rfl: 0   naproxen (NAPROSYN) 375 MG tablet, Take 1 tablet (375 mg total) by mouth 2 (two) times daily with a meal., Disp: 30 tablet, Rfl: 0   nitroGLYCERIN (NITROSTAT) 0.4 MG SL tablet, Place 1 tablet (0.4 mg total) under the tongue every 5 (five) minutes as needed for chest pain. X3 doses. If no relief after 3rd dose, call 911 or go to ER., Disp: 30 tablet, Rfl: 3   Prenatal Vit-Fe Fumarate-FA (MULTIVITAMIN-PRENATAL) 27-0.8 MG TABS tablet, Take 1 tablet by mouth daily at 12 noon., Disp: 30 tablet, Rfl: 6   promethazine (PHENERGAN) 12.5 MG tablet, Take 1 tablet (12.5 mg total) by mouth every 8 (eight) hours as needed for nausea or vomiting., Disp: 20 tablet, Rfl: 0   triamcinolone cream (KENALOG) 0.1 %, Apply 1 Application topically 2 (two) times daily. To affected area, Disp: 30 g, Rfl: 1   Family History  Problem Relation Age of Onset   Hypertension Mother    Breast cancer Mother 36   Prostate cancer Father    Hypertension Father    Diabetes Father    Throat cancer Maternal  Grandmother    Colon cancer Maternal Grandmother    Lung cancer Maternal Grandfather      Social History   Tobacco Use   Smoking status: Never    Passive exposure: Never   Smokeless tobacco: Never  Vaping Use   Vaping Use: Never used  Substance Use Topics   Alcohol use: Not Currently    Comment: social    Drug use: Never    Allergies as of 04/18/2023   (No Known Allergies)    Review of Systems:    All systems reviewed and negative except where noted in HPI.   Physical Exam:  BP 115/78 (BP Location: Left Arm, Patient Position: Sitting, Cuff Size: Normal)   Pulse 77   Temp 98.9 F (37.2 C) (Oral)   Ht 5\' 1"  (1.549 m)   Wt 150 lb (68 kg)   LMP 03/29/2023 (Exact Date)   BMI 28.34 kg/m  Patient's last menstrual period was 03/29/2023 (exact date).  General:   Alert,  Well-developed, well-nourished, pleasant and cooperative in NAD Head:  Normocephalic and atraumatic. Eyes:  Sclera clear, no icterus.   Conjunctiva pink. Ears:  Normal auditory acuity. Nose:  No deformity, discharge, or lesions. Mouth:  No deformity or lesions,oropharynx pink & moist. Neck:  Supple; no masses or thyromegaly. Lungs:  Respirations even and unlabored.  Clear throughout to auscultation.   No wheezes, crackles, or rhonchi. No acute distress. Heart:  Regular rate and rhythm; no murmurs, clicks, rubs, or gallops. Abdomen:  Normal bowel sounds. Soft, non-tender and non-distended without masses, hepatosplenomegaly or hernias noted.  No guarding or rebound tenderness.   Rectal: Not performed Msk:  Symmetrical without gross deformities. Good, equal movement & strength bilaterally. Pulses:  Normal pulses noted. Extremities:  No clubbing or edema.  No cyanosis. Neurologic:  Alert and oriented x3;  grossly normal neurologically. Skin:  Intact without significant lesions or rashes. No jaundice. Psych:  Alert and cooperative. Normal mood and affect.  Imaging Studies: None  Assessment and Plan:    Jaclyn Gray is a 42 y.o. female with history of generalized anxiety disorder is seen in consultation for approximately 42-month history of intermittent episodes of lower abdominal cramps associated with nonbloody loose stools and vomiting.  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 pylori stool antigen Discussed about trial of low-dose amitriptyline at bedtime and she is willing to try Patient will update me via MyChart, also regarding her mom's history of undergoing frequent colonoscopies her mom's history of undergoing frequent colonoscopies  Will schedule colonoscopy based on the above workup  Follow up as needed   Arlyss Repress, MD

## 2023-04-19 LAB — LUPUS (SLE) ANALYSIS
Anti Nuclear Antibody (ANA): NEGATIVE
Anti-striation Abs: NEGATIVE
Complement C4, Serum: 23 mg/dL (ref 12–38)
ENA RNP Ab: <0.2 AI (ref 0.0–0.9)
ENA SM Ab Ser-aCnc: <0.2 AI (ref 0.0–0.9)
ENA SSA (RO) Ab: <0.2 AI (ref 0.0–0.9)
ENA SSB (LA) Ab: <0.2 AI (ref 0.0–0.9)
Mitochondrial Ab: <20 Units (ref 0.0–20.0)
Parietal Cell Ab: 1.1 Units (ref 0.0–20.0)
Scleroderma (Scl-70) (ENA) Antibody, IgG: <0.2 AI (ref 0.0–0.9)
Smooth Muscle Ab: 8 Units (ref 0–19)
Thyroperoxidase Ab SerPl-aCnc: 17 IU/mL (ref 0–34)
dsDNA Ab: <1 IU/mL (ref 0–9)

## 2023-04-21 LAB — H. PYLORI ANTIGEN, STOOL: H pylori Ag, Stl: NEGATIVE

## 2023-04-22 LAB — GI PROFILE, STOOL, PCR

## 2023-04-25 ENCOUNTER — Telehealth: Payer: Self-pay

## 2023-04-25 NOTE — Telephone Encounter (Signed)
-----   Message from Toney Reil, MD sent at 04/24/2023  4:24 PM EDT ----- Stool studies came back normal.  Please schedule colonoscopy as discussed during office visit if patient is agreeable DX: Change in bowel habits  RV

## 2023-04-25 NOTE — Telephone Encounter (Signed)
Tried to call patient but mailbox is full  

## 2023-04-25 NOTE — Telephone Encounter (Signed)
Tried to call patient but unable to reach patient or leave a message. It would ring and then would go silent and no one would say anything. Tried twice

## 2023-04-26 NOTE — Telephone Encounter (Signed)
Tried to call patient but voicemail is full unable to leave a message  

## 2023-04-27 NOTE — Telephone Encounter (Signed)
Unable to reach patient please advise  

## 2023-04-27 NOTE — Telephone Encounter (Signed)
Sent letter

## 2023-04-27 NOTE — Telephone Encounter (Signed)
Tried to call patient but voicemail is full unable to leave a message  

## 2023-04-30 ENCOUNTER — Ambulatory Visit

## 2023-05-02 ENCOUNTER — Encounter: Payer: Self-pay | Admitting: Gastroenterology

## 2023-05-02 ENCOUNTER — Ambulatory Visit: Admission: RE | Admit: 2023-05-02 | Source: Ambulatory Visit

## 2023-05-07 ENCOUNTER — Ambulatory Visit: Admission: RE | Admit: 2023-05-07 | Source: Ambulatory Visit

## 2023-05-08 ENCOUNTER — Encounter: Payer: Self-pay | Admitting: Nurse Practitioner

## 2023-05-08 ENCOUNTER — Telehealth: Payer: Self-pay | Admitting: Nurse Practitioner

## 2023-05-08 ENCOUNTER — Other Ambulatory Visit: Payer: Self-pay

## 2023-05-08 ENCOUNTER — Encounter: Payer: Self-pay | Admitting: Podiatry

## 2023-05-08 ENCOUNTER — Ambulatory Visit (INDEPENDENT_AMBULATORY_CARE_PROVIDER_SITE_OTHER): Payer: Self-pay | Admitting: Nurse Practitioner

## 2023-05-08 VITALS — BP 120/80 | HR 83 | Temp 97.2°F | Resp 16 | Ht 61.0 in | Wt 157.2 lb

## 2023-05-08 DIAGNOSIS — L700 Acne vulgaris: Secondary | ICD-10-CM | POA: Diagnosis not present

## 2023-05-08 DIAGNOSIS — R0789 Other chest pain: Secondary | ICD-10-CM | POA: Diagnosis not present

## 2023-05-08 DIAGNOSIS — M79671 Pain in right foot: Secondary | ICD-10-CM

## 2023-05-08 DIAGNOSIS — G8929 Other chronic pain: Secondary | ICD-10-CM

## 2023-05-08 DIAGNOSIS — Z566 Other physical and mental strain related to work: Secondary | ICD-10-CM

## 2023-05-08 DIAGNOSIS — R079 Chest pain, unspecified: Secondary | ICD-10-CM | POA: Diagnosis not present

## 2023-05-08 DIAGNOSIS — R0602 Shortness of breath: Secondary | ICD-10-CM

## 2023-05-08 DIAGNOSIS — Z1211 Encounter for screening for malignant neoplasm of colon: Secondary | ICD-10-CM

## 2023-05-08 DIAGNOSIS — F411 Generalized anxiety disorder: Secondary | ICD-10-CM

## 2023-05-08 DIAGNOSIS — Z83719 Family history of colon polyps, unspecified: Secondary | ICD-10-CM

## 2023-05-08 MED ORDER — CLENPIQ 10-3.5-12 MG-GM -GM/175ML PO SOLN: 175.0000 mL | Freq: Once | ORAL | 0 refills | Status: AC

## 2023-05-08 NOTE — Telephone Encounter (Addendum)
Awaiting Luanna Salk for Cardiology referral-Toni

## 2023-05-08 NOTE — Progress Notes (Signed)
[facility] [address] [place], [place] [address]  Internal MEDICINE  Office Visit Note  Patient Name: Jaclyn Gray  [id number]  [id number]  Date of Service: 05/08/2023  Chief Complaint  Patient presents with   Hypertension   Follow-up    Review heart monitor.     HPI Jaclyn Gray presents for a follow-up visit for heart monitor results and referrals Long term zio monitor was nonspecific with some random PVCs and ectopic beats but noting persistent or sustained.  Scheduled for echocardiogram on 06/07/23 Still interested in seeing a cardiologist for further evaluation. -- sees podiatry -- [facility] needs the referral renewed. --looking for a therapist -- [facility] needs the referral renewed.  --mild to moderate acne -- wants referral to dermatology.  -- still concerned and wants a referral to cardiologist which I agree with .     Current Medication: Outpatient Encounter Medications as of 05/08/2023  Medication Sig   acetaminophen (TYLENOL) 500 MG tablet Take 2 tablets (1,000 mg total) by mouth every 6 (six) hours as needed.   amitriptyline (ELAVIL) 25 MG tablet Take 1 tablet (25 mg total) by mouth at bedtime.   cyclobenzaprine (FLEXERIL) 5 MG tablet Take 1 tablet (5 mg total) by mouth 3 (three) times daily as needed for muscle spasms.   ibuprofen (ADVIL) 600 MG tablet Take 1 tablet (600 mg total) by mouth every 6 (six) hours as needed.   naproxen (NAPROSYN) 375 MG tablet Take 1 tablet (375 mg total) by mouth 2 (two) times daily with a meal.   nitroGLYCERIN (NITROSTAT) 0.4 MG SL tablet Place 1 tablet (0.4 mg total) under the tongue every 5 (five) minutes as needed for chest pain. X3 doses. If no relief after 3rd dose, call 911 or go to ER.   Prenatal Vit-Fe Fumarate-FA (MULTIVITAMIN-PRENATAL) 27-0.8 MG TABS tablet Take 1 tablet by mouth daily at 12 noon.   promethazine (PHENERGAN) 12.5 MG tablet Take 1 tablet (12.5 mg total) by mouth every 8 (eight) hours as needed for  nausea or vomiting.   triamcinolone cream (KENALOG) 0.1 % Apply 1 Application topically 2 (two) times daily. To affected area   No facility-administered encounter medications on file as of 05/08/2023.    Surgical History: Past Surgical History:  Procedure Laterality Date   BUNIONECTOMY Right 04/21/2022   WISDOM TOOTH EXTRACTION     x4 extracted     Medical History: Past Medical History:  Diagnosis Date   ASCUS with positive high risk HPV cervical on 07/21/2020 07/29/2020   At risk for fertility problems 08/02/2019   Generalized anxiety disorder 05/24/2020   Gestational hypertension 02/10/2021   Low back pain 05/07/2017   Uterine leiomyoma 10/26/2020   At anatomy u/s 09/15/20:  Myomas  Site                     L(cm)      W(cm)      D(cm)       Location  Posterior                5.7        4.7        5.4         Intramural  Posterior                3.5        3.7        4           Intramural  Posterior  3.3        2.6        3.2         Intramural   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    Family History: Family History  Problem Relation Age of Onset   Hypertension Mother    Breast cancer Mother 54   Prostate cancer Father    Hypertension Father    Diabetes Father    Throat cancer Maternal Grandmother    Colon cancer Maternal Grandmother    Lung cancer Maternal Grandfather     Social History   Socioeconomic History   Marital status: Single    Spouse name: Not on file   Number of children: Not on file   Years of education: Not on file   Highest education level: Not on file  Occupational History   Occupation: Hotel manager  Tobacco Use   Smoking status: Never    Passive exposure: Never   Smokeless tobacco: Never  Vaping Use   Vaping Use: Never used  Substance and Sexual Activity   Alcohol use: Not Currently    Comment: social    Drug use: Never   Sexual activity: Not Currently    Birth control/protection: None  Other Topics  Concern   Not on file  Social History Narrative   Not on file   Social Determinants of Health   Financial Resource Strain: Not on file  Food Insecurity: Not on file  Transportation Needs: Not on file  Physical Activity: Not on file  Stress: Not on file  Social Connections: Not on file  Intimate Partner Violence: Not on file      Review of Systems  Constitutional:  Positive for chills. Negative for appetite change, fatigue and fever.  HENT: Negative.    Respiratory:  Positive for shortness of breath. Negative for cough, chest tightness and wheezing.   Cardiovascular:  Positive for chest pain and palpitations.  Gastrointestinal: Negative.  Negative for constipation.  Genitourinary: Negative.   Musculoskeletal: Negative.   Neurological:  Positive for headaches.    Vital Signs: BP 120/80   Pulse 83   Temp (!) 97.2 F (36.2 C)   Resp 16   Ht 5\' 1"  (1.549 m)   Wt 157 lb 3.2 oz (71.3 kg)   SpO2 99%   BMI 29.70 kg/m    Physical Exam Vitals reviewed.  Constitutional:      General: She is not in acute distress.    Appearance: Normal appearance. She is not ill-appearing.  HENT:     Head: Normocephalic and atraumatic.  Eyes:     Pupils: Pupils are equal, round, and reactive to light.  Cardiovascular:     Rate and Rhythm: Normal rate and regular rhythm.     Heart sounds: Normal heart sounds. No murmur heard. Pulmonary:     Effort: Pulmonary effort is normal. No respiratory distress.     Breath sounds: Normal breath sounds. No wheezing.  Neurological:     Mental Status: She is alert and oriented to person, place, and time.  Psychiatric:        Mood and Affect: Mood normal.        Behavior: Behavior normal.        Assessment/Plan: 1. Atypical chest pain Referred to cardiology.  - Ambulatory referral to Cardiology  2. Chest pain with normal EKG Referred to cardiology - Ambulatory referral to Cardiology  3. SOB (shortness of breath) Referre to  cardiology  4. Acne vulgaris  Referred to dermatology  - Ambulatory referral to Dermatology  5. Chronic foot pain, right Podiatry referral renewed - Ambulatory referral to Podiatry  6. Stress at work Refered to psychology, referral renewed  - Ambulatory referral to Psychology  7. Generalized anxiety disorder Referred to psychology  - Ambulatory referral to Psychology   General Counseling: Gretell verbalizes understanding of the findings of todays visit and agrees with plan of treatment. I have discussed any further diagnostic evaluation that may be needed or ordered today. We also reviewed her medications today. she has been encouraged to call the office with any questions or concerns that should arise related to todays visit.    Orders Placed This Encounter  Procedures   Ambulatory referral to Cardiology   Ambulatory referral to Podiatry   Ambulatory referral to Psychology   Ambulatory referral to Dermatology    No orders of the defined types were placed in this encounter.   Return if symptoms worsen or fail to improve.   Total time spent:30 Minutes Time spent includes review of chart, medications, test results, and follow up plan with the patient.   [place] Controlled Substance Database was reviewed by me.  This patient was seen by Sallyanne Kuster, FNP-C in collaboration with Dr. Beverely Risen as a part of collaborative care agreement.   Ayva Veilleux R. Tedd Sias, MSN, FNP-C Internal medicine

## 2023-05-09 ENCOUNTER — Telehealth: Payer: Self-pay

## 2023-05-09 ENCOUNTER — Telehealth: Payer: Self-pay | Admitting: Nurse Practitioner

## 2023-05-09 NOTE — Telephone Encounter (Signed)
Spoke with pt. Pt is aware that she is out of network. Pt states she will call me back to let me know if she is going to fly back to [place] to have Procedure or she may change her address. If she changes or address per pt she will be in network.

## 2023-05-09 NOTE — Telephone Encounter (Addendum)
Luanna Salk pending for cardiology, psychology & dermatology referral-Toni

## 2023-05-10 ENCOUNTER — Telehealth: Payer: Self-pay | Admitting: Nurse Practitioner

## 2023-05-10 NOTE — Telephone Encounter (Signed)
Cardiology appointment>> 06/20/2023-Toni

## 2023-05-10 NOTE — Telephone Encounter (Signed)
Dermatology referral sent via [employer] to Dr. Roseanne Kaufman with [facility]. Cardiology referral sent via Epic to Dr. Kirke Corin with [facility]-Toni

## 2023-05-10 NOTE — Telephone Encounter (Signed)
Lvm and sent message regarding neurology referral-Jaclyn Gray

## 2023-05-10 NOTE — Telephone Encounter (Signed)
Psychology referral faxed to [facility]; [phone]

## 2023-05-14 ENCOUNTER — Telehealth: Payer: Self-pay

## 2023-05-14 ENCOUNTER — Telehealth: Payer: Self-pay | Admitting: Nurse Practitioner

## 2023-05-14 NOTE — Telephone Encounter (Signed)
Called patient and moved patient to 06/07/2023. Called endo and informed trish of the moved. Sent out by mail and [employer] new instructions

## 2023-05-14 NOTE — Telephone Encounter (Signed)
Received updated GI referral authorization from [facility]. Faxed to [facility]; [phone]. Scanned-Toni

## 2023-05-14 NOTE — Telephone Encounter (Signed)
Patient left a message and states she can not do the 13th and needs to reschedule again. Return patient call and she states she will move to 06/06/2023. Called and informed trish in endo and sent new instructions to patient

## 2023-05-14 NOTE — Telephone Encounter (Signed)
Per pt phone call wold like to reschedule procedure.

## 2023-05-15 ENCOUNTER — Telehealth: Payer: Self-pay | Admitting: Podiatry

## 2023-05-15 NOTE — Telephone Encounter (Signed)
Pt called to get the ct scan scheduled and they got the referral from [place] but are needing the authorization from [facility]. Please let pt know when done.

## 2023-05-18 ENCOUNTER — Other Ambulatory Visit: Payer: Self-pay | Admitting: Student

## 2023-05-18 DIAGNOSIS — H53149 Visual discomfort, unspecified: Secondary | ICD-10-CM

## 2023-05-18 DIAGNOSIS — F40298 Other specified phobia: Secondary | ICD-10-CM

## 2023-05-18 DIAGNOSIS — R519 Headache, unspecified: Secondary | ICD-10-CM

## 2023-05-24 ENCOUNTER — Other Ambulatory Visit: Payer: Self-pay | Admitting: Gastroenterology

## 2023-05-25 ENCOUNTER — Ambulatory Visit
Admission: RE | Admit: 2023-05-25 | Discharge: 2023-05-25 | Disposition: A | Discharge status: Home / Self Care | Source: Ambulatory Visit | Attending: Podiatry | Admitting: Podiatry

## 2023-05-25 DIAGNOSIS — M87374 Other secondary osteonecrosis, right foot: Secondary | ICD-10-CM

## 2023-05-28 ENCOUNTER — Other Ambulatory Visit

## 2023-05-30 ENCOUNTER — Telehealth: Payer: Self-pay

## 2023-05-30 NOTE — Telephone Encounter (Signed)
Pt left message would like a call back in ref to colonoscopy please return call

## 2023-06-06 ENCOUNTER — Ambulatory Visit: Admitting: Anesthesiology

## 2023-06-06 ENCOUNTER — Other Ambulatory Visit: Payer: Self-pay

## 2023-06-06 ENCOUNTER — Encounter
Admission: RE | Disposition: A | Discharge status: Home / Self Care | Payer: Self-pay | Source: Home / Self Care | Attending: Gastroenterology

## 2023-06-06 ENCOUNTER — Ambulatory Visit
Admission: RE | Admit: 2023-06-06 | Discharge: 2023-06-06 | Disposition: A | Discharge status: Home / Self Care | Attending: Gastroenterology | Admitting: Gastroenterology

## 2023-06-06 ENCOUNTER — Encounter: Payer: Self-pay | Admitting: Gastroenterology

## 2023-06-06 DIAGNOSIS — Z83719 Family history of colon polyps, unspecified: Secondary | ICD-10-CM | POA: Diagnosis not present

## 2023-06-06 DIAGNOSIS — Z800 Family history of malignant neoplasm of digestive organs: Secondary | ICD-10-CM | POA: Diagnosis not present

## 2023-06-06 DIAGNOSIS — K6289 Other specified diseases of anus and rectum: Secondary | ICD-10-CM

## 2023-06-06 DIAGNOSIS — E755 Other lipid storage disorders: Secondary | ICD-10-CM | POA: Insufficient documentation

## 2023-06-06 DIAGNOSIS — F419 Anxiety disorder, unspecified: Secondary | ICD-10-CM | POA: Diagnosis not present

## 2023-06-06 DIAGNOSIS — Z1211 Encounter for screening for malignant neoplasm of colon: Secondary | ICD-10-CM

## 2023-06-06 HISTORY — PX: COLONOSCOPY WITH PROPOFOL: SHX5780

## 2023-06-06 LAB — POCT PREGNANCY, URINE: Preg Test, Ur: NEGATIVE

## 2023-06-06 SURGERY — COLONOSCOPY WITH PROPOFOL
Anesthesia: General

## 2023-06-06 MED ORDER — DEXMEDETOMIDINE HCL IN NACL 80 MCG/20ML IV SOLN
INTRAVENOUS | Status: DC | PRN
  Administered 2023-06-06 (×2): 8 ug via INTRAVENOUS
  Administered 2023-06-06: 4 ug via INTRAVENOUS

## 2023-06-06 MED ORDER — PROPOFOL 10 MG/ML IV BOLUS
INTRAVENOUS | Status: AC
  Filled 2023-06-06: qty 20

## 2023-06-06 MED ORDER — LIDOCAINE HCL (CARDIAC) PF 100 MG/5ML IV SOSY
PREFILLED_SYRINGE | INTRAVENOUS | Status: DC | PRN
  Administered 2023-06-06: 40 mg via INTRAVENOUS

## 2023-06-06 MED ORDER — PROPOFOL 500 MG/50ML IV EMUL
INTRAVENOUS | Status: DC | PRN
  Administered 2023-06-06: 100 ug/kg/min via INTRAVENOUS

## 2023-06-06 MED ORDER — PROPOFOL 10 MG/ML IV BOLUS
INTRAVENOUS | Status: DC | PRN
  Administered 2023-06-06: 20 mg via INTRAVENOUS
  Administered 2023-06-06: 50 mg via INTRAVENOUS
  Administered 2023-06-06: 30 mg via INTRAVENOUS

## 2023-06-06 MED ORDER — SODIUM CHLORIDE 0.9 % IV SOLN: INTRAVENOUS | Status: DC

## 2023-06-06 NOTE — Anesthesia Postprocedure Evaluation (Signed)
Anesthesia Post Note  Patient: Jaclyn Gray  Procedure(s) Performed: COLONOSCOPY WITH PROPOFOL  Patient location during evaluation: PACU Anesthesia Type: General Level of consciousness: awake and alert, oriented and patient cooperative Pain management: pain level controlled Vital Signs Assessment: post-procedure vital signs reviewed and stable Respiratory status: spontaneous breathing, nonlabored ventilation and respiratory function stable Cardiovascular status: blood pressure returned to baseline and stable Postop Assessment: adequate PO intake Anesthetic complications: no   No notable events documented.   Last Vitals:  Vitals:   06/06/23 1219 06/06/23 1240  BP: 97/60 104/79  Pulse: 74   Resp: 16   Temp:    SpO2: 100%     Last Pain:  Vitals:   06/06/23 1240  TempSrc:   PainSc: 0-No pain                 Reed Breech

## 2023-06-06 NOTE — Anesthesia Procedure Notes (Signed)
Procedure Name: General with mask airway Date/Time: 06/06/2023 11:54 AM  Performed by: Lily Lovings, CRNAPre-anesthesia Checklist: Patient identified, Suction available, Emergency Drugs available, Patient being monitored and Timeout performed Patient Re-evaluated:Patient Re-evaluated prior to induction Oxygen Delivery Method: Nasal cannula Induction Type: IV induction

## 2023-06-06 NOTE — H&P (Addendum)
Arlyss Repress, MD [address]  [address]  [place], [place] [address]  Main: [phone]  Fax: [phone] Pager: [phone]  Primary Care Physician:  Sallyanne Kuster, NP Primary Gastroenterologist:  Dr. Arlyss Repress  Pre-Procedure History & Physical: HPI:  Jaclyn Gray is a 42 y.o. female is here for an colonoscopy.   Past Medical History:  Diagnosis Date   ASCUS with positive high risk HPV cervical on 07/21/2020 07/29/2020   At risk for fertility problems 08/02/2019   Generalized anxiety disorder 05/24/2020   Gestational hypertension 02/10/2021   Low back pain 05/07/2017   Uterine leiomyoma 10/26/2020   At anatomy u/s 09/15/20:  Myomas  Site                     L(cm)      W(cm)      D(cm)       Location  Posterior                5.7        4.7        5.4         Intramural  Posterior                3.5        3.7        4           Intramural  Posterior                3.3        2.6        3.2         Intramural   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 Authorizing Provider  acetaminophen (TYLENOL) 500 MG tablet Take 2 tablets (1,000 mg total) by mouth every 6 (six) hours as needed. 06/16/22  Yes Carlisle Beers, FNP  amitriptyline (ELAVIL) 25 MG tablet TAKE 1 TABLET(25 MG) BY MOUTH AT BEDTIME 05/24/23  Yes Marvell Tamer, Loel Dubonnet, MD  cyclobenzaprine (FLEXERIL) 5 MG tablet Take 1 tablet (5 mg total) by mouth 3 (three) times daily as needed for muscle spasms. 03/30/23  Yes Wallis Bamberg, PA-C  ibuprofen (ADVIL) 600 MG tablet Take 1 tablet (600 mg total) by mouth every 6 (six) hours as needed. 06/16/22  Yes Carlisle Beers, FNP  naproxen (NAPROSYN) 375 MG tablet Take 1 tablet (375 mg total) by mouth 2 (two) times daily with a meal.  03/30/23  Yes Wallis Bamberg, PA-C  nitroGLYCERIN (NITROSTAT) 0.4 MG SL tablet Place 1 tablet (0.4 mg total) under the tongue every 5 (five) minutes as needed for chest pain. X3 doses. If no relief after 3rd dose, call 911 or go to ER. 04/02/23  Yes Abernathy, Alyssa, NP  Prenatal Vit-Fe Fumarate-FA (MULTIVITAMIN-PRENATAL) 27-0.8 MG TABS tablet Take 1 tablet by mouth daily at 12 noon. 01/24/21  Yes Anyanwu, Jethro Bastos, MD  promethazine (PHENERGAN) 12.5 MG tablet Take 1 tablet (12.5 mg total) by mouth every 8 (eight) hours as needed for nausea or vomiting. 11/06/22  Yes Abernathy, Arlyss Repress, NP  triamcinolone cream (KENALOG) 0.1 % Apply 1 Application topically 2 (two) times daily. To affected area 09/27/22  Yes Sallyanne Kuster, NP  venlafaxine (EFFEXOR) 37.5 MG tablet Take 37.5 mg by mouth 2 (two) times daily.   Yes [provider]    Allergies as of 05/09/2023   (No Known Allergies)    Family History  Problem Relation Age of Onset   Hypertension Mother    Breast cancer Mother 23   Prostate cancer Father    Hypertension Father    Diabetes Father    Throat cancer Maternal Grandmother    Colon cancer Maternal Grandmother    Lung cancer Maternal Grandfather     Social History   Socioeconomic History   Marital status: Single    Spouse name: Not on file   Number of children: Not on file   Years of education: Not on file   Highest education level: Not on file  Occupational History   Occupation: Hotel manager  Tobacco Use   Smoking status: Never    Passive exposure: Never   Smokeless tobacco: Never  Vaping Use   Vaping Use: Never used  Substance and Sexual Activity   Alcohol use: Not Currently    Comment: social    Drug use: Never   Sexual activity: Not Currently    Birth control/protection: None  Other Topics Concern   Not on file  Social History Narrative   Not on file   Social Determinants of Health   Financial Resource Strain: Not on file  Food Insecurity: Not on file   Transportation Needs: Not on file  Physical Activity: Not on file  Stress: Not on file  Social Connections: Not on file  Intimate Partner Violence: Not on file    Review of Systems: See HPI, otherwise negative ROS  Physical Exam: BP 111/83   Pulse 73   Temp 97.8 F (36.6 C) (Temporal)   Resp 15   Ht 5\' 1"  (1.549 m)   Wt 68.9 kg   SpO2 100%   BMI 28.72 kg/m  General:   Alert,  pleasant and cooperative in NAD Head:  Normocephalic and atraumatic. Neck:  Supple; no masses or thyromegaly. Lungs:  Clear throughout to auscultation.    Heart:  Regular rate and rhythm. Abdomen:  Soft, nontender and nondistended. Normal bowel sounds, without guarding, and without rebound.   Neurologic:  Alert and  oriented x4;  grossly normal neurologically.  Impression/Plan: Jaclyn Gray is here for an colonoscopy to be performed for mother with history of polyps and grandmother with colon cancer   Risks, benefits, limitations, and alternatives regarding  colonoscopy have been reviewed with the patient.  Questions have been answered.  All parties agreeable.   Lannette Donath, MD  06/06/2023, 11:11 AM

## 2023-06-06 NOTE — Op Note (Signed)
[facility] ([facility] Gastroenterology Patient Name: Jaclyn Gray Procedure Date: 06/06/2023 11:41 AM MRN: [id number] Account #: [id number] Date of Birth: 05-19-1981 Admit Type: Outpatient Age: 42 Room: [facility] ENDO ROOM 4 Gender: Female Note Status: Finalized Instrument Name: Prentice Docker [id number] Procedure:             Colonoscopy Indications:           Screening for colon cancer: Family history of colon                         polyps in distant relative(s) before age 71, This is                         the patient's first colonoscopy Providers:             Toney Reil MD, MD Referring MD:          Sallyanne Kuster (Referring MD) Medicines:             General Anesthesia Complications:         No immediate complications. Estimated blood loss: None. Procedure:             Pre-Anesthesia Assessment:                        - Prior to the procedure, a History and Physical was                         performed, and patient medications and allergies were                         reviewed. The patient is competent. The risks and                         benefits of the procedure and the sedation options and                         risks were discussed with the patient. All questions                         were answered and informed consent was obtained.                         Patient identification and proposed procedure were                         verified by the physician, the nurse, the                         anesthesiologist, the anesthetist and the technician                         in the pre-procedure area in the procedure room in the                         endoscopy suite. Mental Status Examination: alert and                         oriented. Airway Examination: normal oropharyngeal  airway and neck mobility. Respiratory Examination:                         clear to auscultation. CV Examination: normal.                          Prophylactic Antibiotics: The patient does not require                         prophylactic antibiotics. Prior Anticoagulants: The                         patient has taken no anticoagulant or antiplatelet                         agents. ASA Grade Assessment: II - A patient with mild                         systemic disease. After reviewing the risks and                         benefits, the patient was deemed in satisfactory                         condition to undergo the procedure. The anesthesia                         plan was to use general anesthesia. Immediately prior                         to administration of medications, the patient was                         re-assessed for adequacy to receive sedatives. The                         heart rate, respiratory rate, oxygen saturations,                         blood pressure, adequacy of pulmonary ventilation, and                         response to care were monitored throughout the                         procedure. The physical status of the patient was                         re-assessed after the procedure.                        After obtaining informed consent, the colonoscope was                         passed under direct vision. Throughout the procedure,                         the patient's blood pressure, pulse, and oxygen  saturations were monitored continuously. The                         Colonoscope was introduced through the anus and                         advanced to the the terminal ileum, with                         identification of the appendiceal orifice and IC                         valve. The colonoscopy was performed without                         difficulty. The patient tolerated the procedure well.                         The quality of the bowel preparation was evaluated                         using the BBPS [facility] Bowel Preparation Scale) with                         scores  of: Right Colon = 3, Transverse Colon = 3 and                         Left Colon = 3 (entire mucosa seen well with no                         residual staining, small fragments of stool or opaque                         liquid). The total BBPS score equals 9. The terminal                         ileum, ileocecal valve, appendiceal orifice, and                         rectum were photographed. Findings:      The perianal and digital rectal examinations were normal. Pertinent       negatives include normal sphincter tone and no palpable rectal lesions.      The terminal ileum appeared normal.      A diffuse area of mildly erythematous mucosa was found in the distal       rectum. Biopsies were taken with a cold forceps for histology.      Normal mucosa was found in the entire colon.      The retroflexed view of the distal rectum and anal verge was normal and       showed no anal or rectal abnormalities. Impression:            - The examined portion of the ileum was normal.                        - Erythematous mucosa in the distal rectum. Biopsied.                        -  Normal mucosa in the entire examined colon.                        - The distal rectum and anal verge are normal on                         retroflexion view. Recommendation:        - Discharge patient to home (with escort).                        - Resume previous diet today.                        - Continue present medications.                        - Await pathology results. Procedure Code(s):     --- Professional ---                        [phone], Colonoscopy, flexible; with biopsy, single or                         multiple Diagnosis Code(s):     --- Professional ---                        Z12.11, Encounter for screening for malignant neoplasm                         of colon                        Z83.71, Family history of colonic polyps                        K62.89, Other specified diseases of anus and  rectum CPT copyright 2022 American Medical Association. All rights reserved. The codes documented in this report are preliminary and upon coder review may  be revised to meet current compliance requirements. Dr. Libby Maw Toney Reil MD, MD 06/06/2023 12:17:07 PM This report has been signed electronically. Number of Addenda: 0 Note Initiated On: 06/06/2023 11:41 AM Scope Withdrawal Time: 0 hours 7 minutes 45 seconds  Total Procedure Duration: 0 hours 11 minutes 49 seconds  Estimated Blood Loss:  Estimated blood loss: none.      [facility]

## 2023-06-06 NOTE — Anesthesia Preprocedure Evaluation (Addendum)
Anesthesia Evaluation  Patient identified by MRN, date of birth, ID band Patient awake    Reviewed: Allergy & Precautions, NPO status , Patient's Chart, lab work & pertinent test results  History of Anesthesia Complications Negative for: history of anesthetic complications  Airway Mallampati: I   Neck ROM: Full    Dental no notable dental hx.    Pulmonary neg pulmonary ROS   Pulmonary exam normal breath sounds clear to auscultation       Cardiovascular Exercise Tolerance: Good negative cardio ROS Normal cardiovascular exam Rhythm:Regular Rate:Normal  ECG 03/30/23: Normal sinus rhythm Possible Left atrial enlargement   Neuro/Psych  PSYCHIATRIC DISORDERS Anxiety     negative neurological ROS     GI/Hepatic negative GI ROS,,,  Endo/Other  negative endocrine ROS    Renal/GU negative Renal ROS     Musculoskeletal   Abdominal   Peds  Hematology negative hematology ROS (+)   Anesthesia Other Findings   Reproductive/Obstetrics                             Anesthesia Physical Anesthesia Plan  ASA: 2  Anesthesia Plan: General   Post-op Pain Management:    Induction: Intravenous  PONV Risk Score and Plan: 3 and Propofol infusion, TIVA and Treatment may vary due to age or medical condition  Airway Management Planned: Natural Airway  Additional Equipment:   Intra-op Plan:   Post-operative Plan:   Informed Consent: I have reviewed the patients History and Physical, chart, labs and discussed the procedure including the risks, benefits and alternatives for the proposed anesthesia with the patient or authorized representative who has indicated his/her understanding and acceptance.       Plan Discussed with: CRNA  Anesthesia Plan Comments: (LMA/GETA backup discussed.  Patient consented for risks of anesthesia including but not limited to:  - adverse reactions to medications - damage to  eyes, teeth, lips or other oral mucosa - nerve damage due to positioning  - sore throat or hoarseness - damage to heart, brain, nerves, lungs, other parts of body or loss of life  Informed patient about role of CRNA in peri- and intra-operative care.  Patient voiced understanding.)        Anesthesia Quick Evaluation

## 2023-06-06 NOTE — Transfer of Care (Signed)
Immediate Anesthesia Transfer of Care Note  Patient: Lalaine Overstreet  Procedure(s) Performed: COLONOSCOPY WITH PROPOFOL  Patient Location: PACU  Anesthesia Type:General  Level of Consciousness: drowsy and responds to stimulation  Airway & Oxygen Therapy: Patient Spontanous Breathing and Patient connected to nasal cannula oxygen  Post-op Assessment: Report given to RN and Post -op Vital signs reviewed and stable  Post vital signs: Reviewed and stable  Last Vitals:  Vitals Value Taken Time  BP    Temp    Pulse 73 06/06/23 1219  Resp 16 06/06/23 1219  SpO2 100 % 06/06/23 1219  Vitals shown include unvalidated device data.  Last Pain:  Vitals:   06/06/23 1052  TempSrc: Temporal  PainSc: 0-No pain         Complications: No notable events documented.

## 2023-06-07 ENCOUNTER — Ambulatory Visit
Admission: RE | Admit: 2023-06-07 | Discharge: 2023-06-07 | Disposition: A | Discharge status: Home / Self Care | Source: Ambulatory Visit | Attending: Nurse Practitioner | Admitting: Nurse Practitioner

## 2023-06-07 ENCOUNTER — Encounter: Payer: Self-pay | Admitting: Gastroenterology

## 2023-06-07 DIAGNOSIS — R079 Chest pain, unspecified: Secondary | ICD-10-CM | POA: Diagnosis not present

## 2023-06-07 DIAGNOSIS — R0789 Other chest pain: Secondary | ICD-10-CM | POA: Diagnosis present

## 2023-06-07 DIAGNOSIS — I081 Rheumatic disorders of both mitral and tricuspid valves: Secondary | ICD-10-CM | POA: Insufficient documentation

## 2023-06-07 DIAGNOSIS — R0602 Shortness of breath: Secondary | ICD-10-CM | POA: Diagnosis not present

## 2023-06-07 LAB — ECHOCARDIOGRAM COMPLETE
AR max vel: 2.62 cm2
AV Area VTI: 2.57 cm2
AV Area mean vel: 2.5 cm2
AV Mean grad: 4 mmHg
AV Peak grad: 7.7 mmHg
Ao pk vel: 1.39 m/s
Area-P 1/2: 2.95 cm2
MV VTI: 2.85 cm2
S' Lateral: 2.8 cm

## 2023-06-07 NOTE — Progress Notes (Signed)
*  PRELIMINARY RESULTS* Echocardiogram 2D Echocardiogram has been performed.  Jaclyn Gray 06/07/2023, 10:52 AM

## 2023-06-14 ENCOUNTER — Other Ambulatory Visit: Payer: Self-pay | Admitting: Nurse Practitioner

## 2023-06-14 DIAGNOSIS — Z1231 Encounter for screening mammogram for malignant neoplasm of breast: Secondary | ICD-10-CM

## 2023-06-15 ENCOUNTER — Encounter: Payer: Self-pay | Admitting: Podiatry

## 2023-06-15 ENCOUNTER — Ambulatory Visit
Admission: RE | Admit: 2023-06-15 | Discharge: 2023-06-15 | Disposition: A | Discharge status: Home / Self Care | Source: Ambulatory Visit | Attending: Nurse Practitioner | Admitting: Nurse Practitioner

## 2023-06-15 DIAGNOSIS — Z1231 Encounter for screening mammogram for malignant neoplasm of breast: Secondary | ICD-10-CM | POA: Insufficient documentation

## 2023-06-15 NOTE — Progress Notes (Signed)
Discuss results at next office visit

## 2023-06-17 NOTE — Progress Notes (Unsigned)
Cardiology Office Note:   Date:  06/20/2023  NAME:  Jaclyn Gray    MRN: [id number] DOB:  1981-08-02   PCP:  Sallyanne Kuster, NP  Cardiologist:  Reatha Harps, MD  Electrophysiologist:  None   Referring MD: Sallyanne Kuster, NP   Chief Complaint  Patient presents with   Chest Pain    History of Present Illness:   Jaclyn Gray is a 42 y.o. female with a hx of back pain who is being seen today for the evaluation of chest pain at the request of Sallyanne Kuster, NP.  She reports in April she had episodes of chest pain, shortness of breath and tachycardia.  She was actually seen in the emergency room on 03/30/2023.  Described persistent midsternal chest tightness and shortness of breath.  Workup in the ER was negative.  She was evaluated by her primary care physician and her echo was ordered which was normal.  She also wore a monitor which was normal.  She reports the tightness in her chest has resolved.  She is exercising which includes riding a bike 5 miles per week.  Reports no limitations with this.  EKG is normal.  CV exam is normal.  I personally reviewed her echo and monitor.  Both are normal.  She works for the [employer] full-time.  She works out of [employer].  Family history is significant for hypertension but no heart disease.  She does describe some symptoms of constipation and underwent a colonoscopy that was normal.  Her chest tightness has resolved.  She reports stress could have been the trigger.  Overall seems to be doing better.  Reports no smoking.  No alcohol or drug use.  Overall doing quite well.  Labs show normal thyroid with a TSH of 1.99.  Hemoglobin 14.7.  Lipids are within limits.  Serum creatinine normal.  Completed echo and monitor. Normal.   Past Medical History: Past Medical History:  Diagnosis Date   ASCUS with positive high risk HPV cervical on 07/21/2020 07/29/2020   At risk for fertility problems 08/02/2019   Generalized anxiety disorder  05/24/2020   Gestational hypertension 02/10/2021   Low back pain 05/07/2017   Uterine leiomyoma 10/26/2020   At anatomy u/s 09/15/20:  Myomas  Site                     L(cm)      W(cm)      D(cm)       Location  Posterior                5.7        4.7        5.4         Intramural  Posterior                3.5        3.7        4           Intramural  Posterior                3.3        2.6        3.2         Intramural   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    Past Surgical History: Past Surgical History:  Procedure Laterality Date   BUNIONECTOMY Right 04/21/2022   COLONOSCOPY WITH PROPOFOL N/A 06/06/2023   Procedure: COLONOSCOPY WITH PROPOFOL;  Surgeon: Toney Reil, MD;  Location: [facility] ENDOSCOPY;  Service: Gastroenterology;  Laterality: N/A;   WISDOM TOOTH EXTRACTION     x4 extracted     Current Medications: Current Meds  Medication Sig   Prenatal Vit-Fe Fumarate-FA (MULTIVITAMIN-PRENATAL) 27-0.8 MG TABS tablet Take 1 tablet by mouth daily at 12 noon.     Allergies:    Patient has no known allergies.   Social History: Social History   Socioeconomic History   Marital status: Single    Spouse name: Not on file   Number of children: 1   Years of education: Not on file   Highest education level: Not on file  Occupational History   Occupation: Hotel manager  Tobacco Use   Smoking status: Never    Passive exposure: Never   Smokeless tobacco: Never  Vaping Use   Vaping Use: Never used  Substance and Sexual Activity   Alcohol use: Not Currently    Comment: social    Drug use: Never   Sexual activity: Not Currently    Birth control/protection: None  Other Topics Concern   Not on file  Social History Narrative   Not on file   Social Determinants of Health   Financial Resource Strain: Not on file  Food Insecurity: Not on file  Transportation Needs: Not on file  Physical Activity: Not on file  Stress: Not on file  Social Connections: Not  on file     Family History: The patient's family history includes Breast cancer (age of onset: 31) in her mother; Colon cancer in her maternal grandmother; Diabetes in her father; Hypertension in her father and mother; Lung cancer in her maternal grandfather; Prostate cancer in her father; Throat cancer in her maternal grandmother.  ROS:   All other ROS reviewed and negative. Pertinent positives noted in the HPI.     EKGs/Labs/Other Studies Reviewed:   The following studies were personally reviewed by me today:  EKG:  EKG is ordered today.    EKG Interpretation  Date/Time:  Wednesday June 20 2023 09:22:14 EDT Ventricular Rate:  76 PR Interval:  146 QRS Duration: 72 QT Interval:  364 QTC Calculation: 409 R Axis:   50 Text Interpretation: Normal sinus rhythm Normal ECG Confirmed by Lennie Odor [phone]) on 06/20/2023 9:24:25 AM   TTE 06/07/2023  1. Left ventricular ejection fraction, by estimation, is 60 to 65%. The  left ventricle has normal function. The left ventricle has no regional  wall motion abnormalities. Left ventricular diastolic parameters were  normal.   2. Right ventricular systolic function is normal. The right ventricular  size is normal. There is normal pulmonary artery systolic pressure. The  estimated right ventricular systolic pressure is 14.7 mmHg.   3. The mitral valve is normal in structure. Mild mitral valve  regurgitation. No evidence of mitral stenosis.   4. The aortic valve is tricuspid. Aortic valve regurgitation is not  visualized. No aortic stenosis is present.   5. The inferior vena cava is normal in size with greater than 50%  respiratory variability, suggesting right atrial pressure of 3 mmHg.   Zio 05/04/2023 Normal monitor without arrhythmias.   Recent Labs: 01/03/2023: ALT 14; BUN 10; Creatinine, Ser 0.76; Hemoglobin 13.6; Platelets 244; Potassium 4.1; Sodium 143; TSH 1.990   Recent Lipid Panel    Component Value Date/Time   CHOL 167  01/03/2023 1039   TRIG 36 01/03/2023 1039   HDL 60 01/03/2023 1039   CHOLHDL 2.8 01/03/2023 1039   LDLCALC 99  01/03/2023 1039    Physical Exam:   VS:  BP 104/80 (BP Location: Left Arm, Patient Position: Sitting, Cuff Size: Normal)   Pulse 76   Ht 5\' 1"  (1.549 m)   Wt 158 lb 3.2 oz (71.8 kg)   LMP 05/26/2023   SpO2 98%   BMI 29.89 kg/m    Wt Readings from Last 3 Encounters:  06/20/23 158 lb 3.2 oz (71.8 kg)  06/19/23 153 lb (69.4 kg)  06/06/23 152 lb (68.9 kg)    General: Well nourished, well developed, in no acute distress Head: Atraumatic, normal size  Eyes: PEERLA, EOMI  Neck: Supple, no JVD Endocrine: No thryomegaly Cardiac: Normal S1, S2; RRR; no murmurs, rubs, or gallops Lungs: Clear to auscultation bilaterally, no wheezing, rhonchi or rales  Abd: Soft, nontender, no hepatomegaly  Ext: No edema, pulses 2+ Musculoskeletal: No deformities, BUE and BLE strength normal and equal Skin: Warm and dry, no rashes   Neuro: Alert and oriented to person, place, time, and situation, CNII-XII grossly intact, no focal deficits  Psych: Normal mood and affect   ASSESSMENT:   Jaclyn Gray is a 42 y.o. female who presents for the following: 1. Precordial pain   2. SOB (shortness of breath) on exertion   3. Palpitations     PLAN:   1. Precordial pain 2. SOB (shortness of breath) on exertion 3. Palpitations -Was having episodes of chest tightness shortness of breath and palpitations in April.  Underwent an echo that was normal.  Monitor was normal.  Chest tightness has resolved.  EKG is normal.  CV exam is normal.  Given no further symptoms we discussed that she does not need any further testing.  She can exercise which includes riding a bike for 5 miles without chest pains or trouble breathing.  Her chest discomfort is likely noncardiac.  We discussed that she could pursue an exercise treadmill stress test if she wanted but this is not entirely necessary.  We decided together to  forego any further testing getting no recurrence of symptoms.  She will see [place] back as needed.  I suspect this was stress or anxiety related.  She is working on this.  Overall doing well.     Disposition: Return if symptoms worsen or fail to improve.  Medication Adjustments/Labs and Tests Ordered: Current medicines are reviewed at length with the patient today.  Concerns regarding medicines are outlined above.  Orders Placed This Encounter  Procedures   EKG 12-Lead   No orders of the defined types were placed in this encounter.  Patient Instructions  Medication Instructions:  The current medical regimen is effective;  continue present plan and medications as directed. Please refer to the Current Medication list given to you today.  *If you need a refill on your cardiac medications before your next appointment, please call your pharmacy*  Lab Work: NONE ordered at this time of appointment   If you have labs (blood work) drawn today and your tests are completely normal, you will receive your results only by:  MyChart Message (if you have MyChart) OR  A paper copy in the mail If you have any lab test that is abnormal or we need to change your treatment, we will call you to review the results.  Testing/Procedures: NONE ordered at this time of appointment    Follow-Up: At [facility], you and your health needs are our priority.  As part of our continuing mission to provide you with exceptional heart care, we  have created designated Provider Care Teams.  These Care Teams include your primary Cardiologist (physician) and Advanced Practice Providers (APPs -  Physician Assistants and Nurse Practitioners) who all work together to provide you with the care you need, when you need it.  Your next appointment:   FOLLOW UP AS NEEDED   Provider:   Reatha Harps, MD     Other Instructions     Signed, Lenna Gilford. Flora Lipps, MD, [facility]  [facility]  [address]  [address] [place], [place] [address] [phone]  06/20/2023 9:39 AM

## 2023-06-19 ENCOUNTER — Telehealth: Payer: Self-pay | Admitting: Nurse Practitioner

## 2023-06-19 ENCOUNTER — Encounter: Payer: Self-pay | Admitting: Nurse Practitioner

## 2023-06-19 VITALS — Temp 98.6°F | Ht 61.0 in | Wt 153.0 lb

## 2023-06-19 DIAGNOSIS — J0110 Acute frontal sinusitis, unspecified: Secondary | ICD-10-CM | POA: Diagnosis not present

## 2023-06-19 MED ORDER — AMOXICILLIN-POT CLAVULANATE 875-125 MG PO TABS
1.0000 | ORAL_TABLET | Freq: Two times a day (BID) | ORAL | 0 refills | Status: AC
Start: 2023-06-19 — End: 2023-06-29

## 2023-06-19 NOTE — Progress Notes (Signed)
[facility] [address] [place], [place] [address]  Internal MEDICINE  Telephone Visit  Patient Name: Jaclyn Gray  [id number]  [id number]  Date of Service: 06/19/2023  I connected with the patient at 1550 by telephone and verified the patients identity using two identifiers.   I discussed the limitations, risks, security and privacy concerns of performing an evaluation and management service by telephone and the availability of in person appointments. I also discussed with the patient that there may be a patient responsible charge related to the service.  The patient expressed understanding and agrees to proceed.    Chief Complaint  Patient presents with   Telephone Assessment   Telephone Screen    Covid test and flu is negative going on since Sunday    Sinusitis   Cough   Headache   Nausea    HPI Morning presents for a telehealth virtual visit for symptoms of sinusitis. Onset of symptoms was Sunday Negative for covid and flu Reports cough, headache, nausea, sore throat, sinus pressure, body aches, weakness, nasal congestion and fatigue   Current Medication: Outpatient Encounter Medications as of 06/19/2023  Medication Sig   amoxicillin-clavulanate (AUGMENTIN) 875-125 MG tablet Take 1 tablet by mouth 2 (two) times daily for 10 days. Take with food.   acetaminophen (TYLENOL) 500 MG tablet Take 2 tablets (1,000 mg total) by mouth every 6 (six) hours as needed.   amitriptyline (ELAVIL) 25 MG tablet TAKE 1 TABLET(25 MG) BY MOUTH AT BEDTIME   cyclobenzaprine (FLEXERIL) 5 MG tablet Take 1 tablet (5 mg total) by mouth 3 (three) times daily as needed for muscle spasms.   ibuprofen (ADVIL) 600 MG tablet Take 1 tablet (600 mg total) by mouth every 6 (six) hours as needed.   naproxen (NAPROSYN) 375 MG tablet Take 1 tablet (375 mg total) by mouth 2 (two) times daily with a meal.   nitroGLYCERIN (NITROSTAT) 0.4 MG SL tablet Place 1 tablet (0.4 mg total) under the  tongue every 5 (five) minutes as needed for chest pain. X3 doses. If no relief after 3rd dose, call 911 or go to ER.   Prenatal Vit-Fe Fumarate-FA (MULTIVITAMIN-PRENATAL) 27-0.8 MG TABS tablet Take 1 tablet by mouth daily at 12 noon.   promethazine (PHENERGAN) 12.5 MG tablet Take 1 tablet (12.5 mg total) by mouth every 8 (eight) hours as needed for nausea or vomiting.   triamcinolone cream (KENALOG) 0.1 % Apply 1 Application topically 2 (two) times daily. To affected area   venlafaxine (EFFEXOR) 37.5 MG tablet Take 37.5 mg by mouth 2 (two) times daily.   No facility-administered encounter medications on file as of 06/19/2023.    Surgical History: Past Surgical History:  Procedure Laterality Date   BUNIONECTOMY Right 04/21/2022   COLONOSCOPY WITH PROPOFOL N/A 06/06/2023   Procedure: COLONOSCOPY WITH PROPOFOL;  Surgeon: Toney Reil, MD;  Location: [facility] ENDOSCOPY;  Service: Gastroenterology;  Laterality: N/A;   WISDOM TOOTH EXTRACTION     x4 extracted     Medical History: Past Medical History:  Diagnosis Date   ASCUS with positive high risk HPV cervical on 07/21/2020 07/29/2020   At risk for fertility problems 08/02/2019   Generalized anxiety disorder 05/24/2020   Gestational hypertension 02/10/2021   Low back pain 05/07/2017   Uterine leiomyoma 10/26/2020   At anatomy u/s 09/15/20:  Myomas  Site                     L(cm)  W(cm)      D(cm)       Location  Posterior                5.7        4.7        5.4         Intramural  Posterior                3.5        3.7        4           Intramural  Posterior                3.3        2.6        3.2         Intramural   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    Family History: Family History  Problem Relation Age of Onset   Hypertension Mother    Breast cancer Mother 16   Prostate cancer Father    Hypertension Father    Diabetes Father    Throat cancer Maternal Grandmother    Colon cancer Maternal  Grandmother    Lung cancer Maternal Grandfather     Social History   Socioeconomic History   Marital status: Single    Spouse name: Not on file   Number of children: Not on file   Years of education: Not on file   Highest education level: Not on file  Occupational History   Occupation: Hotel manager  Tobacco Use   Smoking status: Never    Passive exposure: Never   Smokeless tobacco: Never  Vaping Use   Vaping Use: Never used  Substance and Sexual Activity   Alcohol use: Not Currently    Comment: social    Drug use: Never   Sexual activity: Not Currently    Birth control/protection: None  Other Topics Concern   Not on file  Social History Narrative   Not on file   Social Determinants of Health   Financial Resource Strain: Not on file  Food Insecurity: Not on file  Transportation Needs: Not on file  Physical Activity: Not on file  Stress: Not on file  Social Connections: Not on file  Intimate Partner Violence: Not on file      Review of Systems  Constitutional:  Positive for fatigue. Negative for chills and fever.  HENT:  Positive for congestion, postnasal drip, rhinorrhea, sinus pressure, sinus pain and sore throat.   Respiratory:  Positive for cough. Negative for chest tightness, shortness of breath and wheezing.   Cardiovascular: Negative.  Negative for chest pain and palpitations.  Gastrointestinal:  Positive for nausea.  Musculoskeletal:  Positive for myalgias.  Neurological:  Positive for weakness and headaches.    Vital Signs: Temp 98.6 F (37 C)   Ht 5\' 1"  (1.549 m)   Wt 153 lb (69.4 kg)   LMP 05/26/2023   BMI 28.91 kg/m    Observation/Objective: She is alert and oriented and engages in conversation appropriately. No acute distress noted.     Assessment/Plan: 1. Acute non-recurrent frontal sinusitis Empiric antibiotic treatment prescribed. Patient instructed to use home interventions or OTC medication for symptomatic relief of cough.  -  amoxicillin-clavulanate (AUGMENTIN) 875-125 MG tablet; Take 1 tablet by mouth 2 (two) times daily for 10 days. Take with food.  Dispense: 20 tablet; Refill: 0   General Counseling: Lunell verbalizes understanding of the  findings of today's phone visit and agrees with plan of treatment. I have discussed any further diagnostic evaluation that may be needed or ordered today. We also reviewed her medications today. she has been encouraged to call the office with any questions or concerns that should arise related to todays visit.  Return if symptoms worsen or fail to improve.   No orders of the defined types were placed in this encounter.   Meds ordered this encounter  Medications   amoxicillin-clavulanate (AUGMENTIN) 875-125 MG tablet    Sig: Take 1 tablet by mouth 2 (two) times daily for 10 days. Take with food.    Dispense:  20 tablet    Refill:  0    Time spent:10 Minutes Time spent with patient included reviewing progress notes, labs, imaging studies, and discussing plan for follow up.  [place] Controlled Substance Database was reviewed by me for overdose risk score (ORS) if appropriate.  This patient was seen by Sallyanne Kuster, FNP-C in collaboration with Dr. Beverely Risen as a part of collaborative care agreement.  Zollie Ellery R. Tedd Sias, MSN, FNP-C Internal medicine

## 2023-06-20 ENCOUNTER — Encounter: Payer: Self-pay | Admitting: Cardiovascular Disease

## 2023-06-20 ENCOUNTER — Ambulatory Visit: Attending: Cardiovascular Disease | Admitting: Cardiovascular Disease

## 2023-06-20 VITALS — BP 104/80 | HR 76 | Ht 61.0 in | Wt 158.2 lb

## 2023-06-20 DIAGNOSIS — R002 Palpitations: Secondary | ICD-10-CM

## 2023-06-20 DIAGNOSIS — R0602 Shortness of breath: Secondary | ICD-10-CM

## 2023-06-20 DIAGNOSIS — R072 Precordial pain: Secondary | ICD-10-CM | POA: Diagnosis not present

## 2023-06-20 NOTE — Patient Instructions (Signed)
Medication Instructions:  The current medical regimen is effective;  continue present plan and medications as directed. Please refer to the Current Medication list given to you today.  *If you need a refill on your cardiac medications before your next appointment, please call your pharmacy*  Lab Work: NONE ordered at this time of appointment   If you have labs (blood work) drawn today and your tests are completely normal, you will receive your results only by:  MyChart Message (if you have MyChart) OR  A paper copy in the mail If you have any lab test that is abnormal or we need to change your treatment, we will call you to review the results.  Testing/Procedures: NONE ordered at this time of appointment    Follow-Up: At [facility], you and your health needs are our priority.  As part of our continuing mission to provide you with exceptional heart care, we have created designated Provider Care Teams.  These Care Teams include your primary Cardiologist (physician) and Advanced Practice Providers (APPs -  Physician Assistants and Nurse Practitioners) who all work together to provide you with the care you need, when you need it.  Your next appointment:   FOLLOW UP AS NEEDED   Provider:   Reatha Harps, MD     Other Instructions

## 2023-06-21 ENCOUNTER — Ambulatory Visit
Admission: RE | Admit: 2023-06-21 | Discharge: 2023-06-21 | Disposition: A | Discharge status: Home / Self Care | Source: Ambulatory Visit | Attending: Student | Admitting: Student

## 2023-06-21 DIAGNOSIS — R519 Headache, unspecified: Secondary | ICD-10-CM

## 2023-06-21 DIAGNOSIS — F40298 Other specified phobia: Secondary | ICD-10-CM

## 2023-06-21 DIAGNOSIS — H53149 Visual discomfort, unspecified: Secondary | ICD-10-CM

## 2023-06-27 ENCOUNTER — Ambulatory Visit (INDEPENDENT_AMBULATORY_CARE_PROVIDER_SITE_OTHER)

## 2023-06-27 ENCOUNTER — Ambulatory Visit (HOSPITAL_COMMUNITY)
Admission: EM | Admit: 2023-06-27 | Discharge: 2023-06-27 | Disposition: A | Discharge status: Home / Self Care | Attending: Family Medicine | Admitting: Family Medicine

## 2023-06-27 ENCOUNTER — Other Ambulatory Visit: Payer: Self-pay

## 2023-06-27 ENCOUNTER — Encounter (HOSPITAL_COMMUNITY): Payer: Self-pay | Admitting: Emergency Medicine

## 2023-06-27 DIAGNOSIS — R062 Wheezing: Secondary | ICD-10-CM

## 2023-06-27 DIAGNOSIS — R053 Chronic cough: Secondary | ICD-10-CM

## 2023-06-27 MED ORDER — BENZONATATE 200 MG PO CAPS: ORAL_CAPSULE | ORAL | 0 refills | Status: DC

## 2023-06-27 MED ORDER — DEXAMETHASONE SODIUM PHOSPHATE 10 MG/ML IJ SOLN
10.0000 mg | Freq: Once | INTRAMUSCULAR | Status: AC
  Administered 2023-06-27: 10 mg via INTRAMUSCULAR

## 2023-06-27 MED ORDER — DEXAMETHASONE SODIUM PHOSPHATE 10 MG/ML IJ SOLN
INTRAMUSCULAR | Status: AC
  Filled 2023-06-27: qty 1

## 2023-06-27 NOTE — Discharge Instructions (Signed)
Meds ordered this encounter  Medications   benzonatate (TESSALON) 200 MG capsule    Sig: Take 1 capsule by mouth every 8 (eight) hours for cough.    Dispense:  42 capsule    Refill:  0   dexamethasone (DECADRON) injection 10 mg

## 2023-06-27 NOTE — ED Triage Notes (Signed)
Patient is having increase cough for the past 2 weeks taking PO abx prescribed by pcp with no relief.

## 2023-06-27 NOTE — ED Provider Notes (Signed)
[facility]   [id number] 06/27/23 Arrival Time: 1130  ASSESSMENT & PLAN:  1. Persistent cough   2. Wheezing    I have personally viewed and independently interpreted the imaging studies ordered this visit. No acute changes on CXR: no PNA. No resp distress. Continue and finish Augmentin prescribed by outside provider. OTC symptom care as needed.  Meds ordered this encounter  Medications   benzonatate (TESSALON) 200 MG capsule    Sig: Take 1 capsule by mouth every 8 (eight) hours for cough.    Dispense:  42 capsule    Refill:  0   dexamethasone (DECADRON) injection 10 mg   Work note declined.   Follow-up Information     Sallyanne Kuster, NP.   Specialty: Nurse Practitioner Why: If worsening or failing to improve as anticipated. Contact information: [address] [place] [address] [phone]                 Reviewed expectations re: course of current medical issues. Questions answered. Outlined signs and symptoms indicating need for more acute intervention. Understanding verbalized. After Visit Summary given.   SUBJECTIVE: History from: Patient. Jaclyn Gray is a 42 y.o. female. Reports: ***. Denies: {Symptoms; ION:62952}. Normal PO intake without n/v/d.  OBJECTIVE:  Vitals:   06/27/23 1155 06/27/23 1156  BP: 119/84   Pulse: 79   Resp: 18   Temp: 98.2 F (36.8 C)   TempSrc: Oral   SpO2: 98%   Weight:  72 kg  Height:  5\' 1"  (1.549 m)    General appearance: alert; no distress Eyes: PERRLA; EOMI; conjunctiva normal HENT: [place]; AT; {w-w/o:315700} nasal congestion Neck: supple  Lungs: speaks full sentences without difficulty; unlabored Extremities: no edema Skin: warm and dry Neurologic: normal gait Psychological: alert and cooperative; normal mood and affect  Labs: Results for orders placed or performed during the hospital encounter of 06/07/23  ECHOCARDIOGRAM COMPLETE  Result Value Ref Range   Ao pk vel 1.39 m/s   AV  Area VTI 2.57 cm2   AR max vel 2.62 cm2   AV Mean grad 4.0 mmHg   AV Peak grad 7.7 mmHg   S' Lateral 2.80 cm   AV Area mean vel 2.50 cm2   Area-P 1/2 2.95 cm2   MV VTI 2.85 cm2   Est EF 60 - 65%    Labs Reviewed - No data to display  Imaging: DG Chest 2 View  Result Date: 06/27/2023 CLINICAL DATA:  Persistent cough. EXAM: CHEST - 2 VIEW COMPARISON:  None Available. FINDINGS: The heart size and mediastinal contours are within normal limits. Both lungs are clear without evidence of focal consolidation or pleural effusion. The visualized skeletal structures are unremarkable. IMPRESSION: No active cardiopulmonary disease. Electronically Signed   By: Larose Hires D.O.   On: 06/27/2023 12:19    No Known Allergies  Past Medical History:  Diagnosis Date   ASCUS with positive high risk HPV cervical on 07/21/2020 07/29/2020   At risk for fertility problems 08/02/2019   Generalized anxiety disorder 05/24/2020   Gestational hypertension 02/10/2021   Low back pain 05/07/2017   Uterine leiomyoma 10/26/2020   At anatomy u/s 09/15/20:  Myomas  Site                     L(cm)      W(cm)      D(cm)       Location  Posterior  5.7        4.7        5.4         Intramural  Posterior                3.5        3.7        4           Intramural  Posterior                3.3        2.6        3.2         Intramural   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   Social History   Socioeconomic History   Marital status: Single    Spouse name: Not on file   Number of children: 1   Years of education: Not on file   Highest education level: Not on file  Occupational History   Occupation: Hotel manager  Tobacco Use   Smoking status: Never    Passive exposure: Never   Smokeless tobacco: Never  Vaping Use   Vaping Use: Never used  Substance and Sexual Activity   Alcohol use: Not Currently    Comment: social    Drug use: Never   Sexual activity: Not Currently    Birth  control/protection: None  Other Topics Concern   Not on file  Social History Narrative   Not on file   Social Determinants of Health   Financial Resource Strain: Not on file  Food Insecurity: Not on file  Transportation Needs: Not on file  Physical Activity: Not on file  Stress: Not on file  Social Connections: Not on file  Intimate Partner Violence: Not on file   Family History  Problem Relation Age of Onset   Hypertension Mother    Breast cancer Mother 72   Prostate cancer Father    Hypertension Father    Diabetes Father    Throat cancer Maternal Grandmother    Colon cancer Maternal Grandmother    Lung cancer Maternal Grandfather    Past Surgical History:  Procedure Laterality Date   BUNIONECTOMY Right 04/21/2022   COLONOSCOPY WITH PROPOFOL N/A 06/06/2023   Procedure: COLONOSCOPY WITH PROPOFOL;  Surgeon: Toney Reil, MD;  Location: [facility] ENDOSCOPY;  Service: Gastroenterology;  Laterality: N/A;   WISDOM TOOTH EXTRACTION     x4 extracted

## 2023-06-29 ENCOUNTER — Other Ambulatory Visit: Payer: Self-pay | Admitting: Gastroenterology

## 2023-07-03 ENCOUNTER — Telehealth: Payer: Self-pay | Admitting: Nurse Practitioner

## 2023-07-03 NOTE — Telephone Encounter (Signed)
Dermatology appointment 08/06/2023 @ [facility]

## 2023-07-05 ENCOUNTER — Encounter: Payer: Self-pay | Admitting: Nurse Practitioner

## 2023-07-05 ENCOUNTER — Telehealth: Payer: Self-pay | Admitting: Nurse Practitioner

## 2023-07-05 ENCOUNTER — Telehealth: Payer: Self-pay

## 2023-07-05 ENCOUNTER — Ambulatory Visit (INDEPENDENT_AMBULATORY_CARE_PROVIDER_SITE_OTHER): Payer: Self-pay | Admitting: Nurse Practitioner

## 2023-07-05 VITALS — BP 128/88 | HR 80 | Temp 98.5°F | Resp 16 | Ht 61.0 in | Wt 159.6 lb

## 2023-07-05 DIAGNOSIS — Z3169 Encounter for other general counseling and advice on procreation: Secondary | ICD-10-CM | POA: Diagnosis not present

## 2023-07-05 MED ORDER — FOLIC ACID 1 MG PO TABS
1.0000 mg | ORAL_TABLET | Freq: Every day | ORAL | 1 refills | Status: DC
Start: 2023-07-05 — End: 2024-01-23

## 2023-07-05 NOTE — Telephone Encounter (Signed)
OBGYN referral pending [employer] authorization to Dr. Queen Blossom

## 2023-07-05 NOTE — Progress Notes (Signed)
[facility] [address] [place], [place] [address]  Internal MEDICINE  Office Visit Note  Patient Name: Jaclyn Gray  [id number]  [id number]  Date of Service: 07/05/2023  Chief Complaint  Patient presents with   Follow-up    HPI Jaclyn Gray presents for a follow-up visit for possible new pregnancy but also need preconception counseling Too early to test for pregnancy.  Possible pregnancy  Possible conception date on Saturday Heart is normal Previous anxiety panic attack Having stress at work, need a few days off.     Current Medication: Outpatient Encounter Medications as of 07/05/2023  Medication Sig   acetaminophen (TYLENOL) 500 MG tablet Take 2 tablets (1,000 mg total) by mouth every 6 (six) hours as needed.   amitriptyline (ELAVIL) 25 MG tablet TAKE 1 TABLET(25 MG) BY MOUTH AT BEDTIME   benzonatate (TESSALON) 200 MG capsule Take 1 capsule by mouth every 8 (eight) hours for cough.   cyclobenzaprine (FLEXERIL) 5 MG tablet Take 1 tablet (5 mg total) by mouth 3 (three) times daily as needed for muscle spasms.   folic acid (FOLVITE) 1 MG tablet Take 1 tablet (1 mg total) by mouth daily.   ibuprofen (ADVIL) 600 MG tablet Take 1 tablet (600 mg total) by mouth every 6 (six) hours as needed.   naproxen (NAPROSYN) 375 MG tablet Take 1 tablet (375 mg total) by mouth 2 (two) times daily with a meal.   nitroGLYCERIN (NITROSTAT) 0.4 MG SL tablet Place 1 tablet (0.4 mg total) under the tongue every 5 (five) minutes as needed for chest pain. X3 doses. If no relief after 3rd dose, call 911 or go to ER.   Prenatal Vit-Fe Fumarate-FA (MULTIVITAMIN-PRENATAL) 27-0.8 MG TABS tablet Take 1 tablet by mouth daily at 12 noon.   promethazine (PHENERGAN) 12.5 MG tablet Take 1 tablet (12.5 mg total) by mouth every 8 (eight) hours as needed for nausea or vomiting.   triamcinolone cream (KENALOG) 0.1 % Apply 1 Application topically 2 (two) times daily. To affected area   venlafaxine  (EFFEXOR) 37.5 MG tablet Take 37.5 mg by mouth 2 (two) times daily.   No facility-administered encounter medications on file as of 07/05/2023.    Surgical History: Past Surgical History:  Procedure Laterality Date   BUNIONECTOMY Right 04/21/2022   COLONOSCOPY WITH PROPOFOL N/A 06/06/2023   Procedure: COLONOSCOPY WITH PROPOFOL;  Surgeon: Toney Reil, MD;  Location: [facility] ENDOSCOPY;  Service: Gastroenterology;  Laterality: N/A;   WISDOM TOOTH EXTRACTION     x4 extracted     Medical History: Past Medical History:  Diagnosis Date   ASCUS with positive high risk HPV cervical on 07/21/2020 07/29/2020   At risk for fertility problems 08/02/2019   Generalized anxiety disorder 05/24/2020   Gestational hypertension 02/10/2021   Low back pain 05/07/2017   Uterine leiomyoma 10/26/2020   At anatomy u/s 09/15/20:  Myomas  Site                     L(cm)      W(cm)      D(cm)       Location  Posterior                5.7        4.7        5.4         Intramural  Posterior                3.5  3.7        4           Intramural  Posterior                3.3        2.6        3.2         Intramural   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    Family History: Family History  Problem Relation Age of Onset   Hypertension Mother    Breast cancer Mother 20   Prostate cancer Father    Hypertension Father    Diabetes Father    Throat cancer Maternal Grandmother    Colon cancer Maternal Grandmother    Lung cancer Maternal Grandfather     Social History   Socioeconomic History   Marital status: Single    Spouse name: Not on file   Number of children: 1   Years of education: Not on file   Highest education level: Not on file  Occupational History   Occupation: Hotel manager  Tobacco Use   Smoking status: Never    Passive exposure: Never   Smokeless tobacco: Never  Vaping Use   Vaping status: Never Used  Substance and Sexual Activity   Alcohol use: Not Currently     Comment: social    Drug use: Never   Sexual activity: Not Currently    Birth control/protection: None  Other Topics Concern   Not on file  Social History Narrative   Not on file   Social Determinants of Health   Financial Resource Strain: Not on file  Food Insecurity: Not on file  Transportation Needs: Not on file  Physical Activity: Not on file  Stress: Not on file  Social Connections: Not on file  Intimate Partner Violence: Not on file      Review of Systems  Constitutional: Negative.  Negative for chills, fatigue and unexpected weight change.  HENT: Negative.  Negative for congestion, postnasal drip, rhinorrhea, sneezing and sore throat.   Eyes:  Negative for redness.  Respiratory: Negative.  Negative for cough, chest tightness, shortness of breath and wheezing.   Cardiovascular: Negative.  Negative for chest pain and palpitations.  Gastrointestinal: Negative.  Negative for abdominal pain, constipation, diarrhea, nausea and vomiting.  Genitourinary:  Negative for dysuria and frequency.  Musculoskeletal: Negative.  Negative for arthralgias, back pain, joint swelling and neck pain.  Skin:  Negative for rash.  Neurological: Negative.  Negative for tremors and numbness.  Hematological:  Negative for adenopathy. Does not bruise/bleed easily.  Psychiatric/Behavioral:  Negative for behavioral problems (Depression), self-injury, sleep disturbance and suicidal ideas. The patient is nervous/anxious.     Vital Signs: BP 128/88   Pulse 80   Temp 98.5 F (36.9 C)   Resp 16   Ht 5\' 1"  (1.549 m)   Wt 159 lb 9.6 oz (72.4 kg)   LMP 05/26/2023   SpO2 94%   BMI 30.16 kg/m    Physical Exam Vitals reviewed.  Constitutional:      General: She is not in acute distress.    Appearance: Normal appearance. She is obese. She is not ill-appearing.  HENT:     Head: Normocephalic and atraumatic.  Eyes:     Pupils: Pupils are equal, round, and reactive to light.  Cardiovascular:      Rate and Rhythm: Normal rate and regular rhythm.  Pulmonary:     Effort: Pulmonary effort is normal. No respiratory  distress.  Neurological:     Mental Status: She is alert and oriented to person, place, and time.  Psychiatric:        Mood and Affect: Mood normal.        Behavior: Behavior normal.        Assessment/Plan: 1. Encounter for preconception consultation Patient thinks she is pregnant and conceived about 5 days ago. Too early to test. Patient will call clinic once she has a positive home pregnancy test. Folic acid prescribed to prevent neural tube defects. Referred to OBGYN in [place] for preconception counseling but will also follow up with their office if she does have a positive pregnancy test. She will be considered high risk due to advanced maternal age.  - folic acid (FOLVITE) 1 MG tablet; Take 1 tablet (1 mg total) by mouth daily.  Dispense: 90 tablet; Refill: 1 - Ambulatory referral to Obstetrics / Gynecology    General Counseling: Caren Griffins understanding of the findings of todays visit and agrees with plan of treatment. I have discussed any further diagnostic evaluation that may be needed or ordered today. We also reviewed her medications today. she has been encouraged to call the office with any questions or concerns that should arise related to todays visit.    Orders Placed This Encounter  Procedures   Ambulatory referral to Obstetrics / Gynecology    Meds ordered this encounter  Medications   folic acid (FOLVITE) 1 MG tablet    Sig: Take 1 tablet (1 mg total) by mouth daily.    Dispense:  90 tablet    Refill:  1    Return for F/U as needed call clinic if positive home pregnancy test. .   Total time spent:20 Minutes Time spent includes review of chart, medications, test results, and follow up plan with the patient.   [place] Controlled Substance Database was reviewed by me.  This patient was seen by Sallyanne Kuster, FNP-C in collaboration  with Dr. Beverely Risen as a part of collaborative care agreement.   Eathen Budreau R. Tedd Sias, MSN, FNP-C Internal medicine

## 2023-07-05 NOTE — Telephone Encounter (Signed)
Patient states she left a message 1 week and has not got a return call. She states she is going to try to send a mychart message also since no one is responding to her. Return patient call and apologized to her about not receiving her message and not returning her call. Informed her that I would make my manger aware of this also. She states it is okay things happen that is why she was following up. She states she  is calling because she had a colonoscopy done on 06/06/2023 and was calling to get the results. Informed her that Dr. Allegra Lai was on vacation but would return on 07/16/2023 and would review results and I would give her a call with them when she lets me know them. She states that sounds good and thank you for my help

## 2023-07-06 ENCOUNTER — Telehealth: Payer: Self-pay | Admitting: Nurse Practitioner

## 2023-07-06 NOTE — Telephone Encounter (Signed)
OB/GYN referral faxed to Dr. Tresa Endo with [facility]; [phone]

## 2023-07-15 NOTE — Telephone Encounter (Signed)
Apparently, I did not receive pathology results in my in basket.  However, her pathology results came back normal.  Recommend next surveillance colonoscopy in 5 years  RV

## 2023-07-16 NOTE — Telephone Encounter (Signed)
Patient verablized understanding of results. Put results in health maintenance  and patient recall

## 2023-07-17 ENCOUNTER — Ambulatory Visit (INDEPENDENT_AMBULATORY_CARE_PROVIDER_SITE_OTHER): Admitting: Podiatry

## 2023-07-17 DIAGNOSIS — M109 Gout, unspecified: Secondary | ICD-10-CM | POA: Diagnosis not present

## 2023-07-17 DIAGNOSIS — M87374 Other secondary osteonecrosis, right foot: Secondary | ICD-10-CM | POA: Diagnosis not present

## 2023-07-17 DIAGNOSIS — M19071 Primary osteoarthritis, right ankle and foot: Secondary | ICD-10-CM

## 2023-07-17 NOTE — Progress Notes (Signed)
She presents today for follow-up of her MRI results of her right first metatarsal phalangeal joint.  She does relate having had rheumatoid diagnosed at 1 time.  She states that she is starting to have pain in the right leg as well and does relate a history of back pain.  She states that she is getting more numbness in the right side from her knee down and she is getting some mild mid arch cramping on the left foot.  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  Assessment cannot rule out gouty arthritis or seropositive arthropathy of the first metatarsophalangeal joint right foot status post bunion repair.  Plan: We are going to request an arthritic profile a complete metabolic panel and a CBC.

## 2023-07-18 LAB — COMPREHENSIVE METABOLIC PANEL
AG Ratio: 1.5 (calc) (ref 1.0–2.5)
ALT: 13 U/L (ref 6–29)
AST: 19 U/L (ref 10–30)
Albumin: 4.4 g/dL (ref 3.6–5.1)
Alkaline phosphatase (APISO): 45 U/L (ref 31–125)
BUN: 10 mg/dL (ref 7–25)
CO2: 22 mmol/L (ref 20–32)
Calcium: 9.9 mg/dL (ref 8.6–10.2)
Chloride: 104 mmol/L (ref 98–110)
Creat: 0.88 mg/dL (ref 0.50–0.99)
Globulin: 2.9 g/dL (calc) (ref 1.9–3.7)
Glucose, Bld: 75 mg/dL (ref 65–139)
Potassium: 4.2 mmol/L (ref 3.5–5.3)
Sodium: 139 mmol/L (ref 135–146)
Total Bilirubin: 0.6 mg/dL (ref 0.2–1.2)
Total Protein: 7.3 g/dL (ref 6.1–8.1)

## 2023-07-18 LAB — CBC
HCT: 43.1 % (ref 35.0–45.0)
Hemoglobin: 14.5 g/dL (ref 11.7–15.5)
MCH: 32.2 pg (ref 27.0–33.0)
MCHC: 33.6 g/dL (ref 32.0–36.0)
MCV: 95.8 fL (ref 80.0–100.0)
MPV: 11.5 fL (ref 7.5–12.5)
Platelets: 268 10*3/uL (ref 140–400)
RBC: 4.5 10*6/uL (ref 3.80–5.10)
RDW: 12 % (ref 11.0–15.0)
WBC: 6.2 10*3/uL (ref 3.8–10.8)

## 2023-07-18 LAB — SEDIMENTATION RATE: Sed Rate: 6 mm/h (ref 0–20)

## 2023-07-18 LAB — ANA: Anti Nuclear Antibody (ANA): NEGATIVE

## 2023-07-18 LAB — C-REACTIVE PROTEIN: CRP: <3 mg/L (ref <8.0)

## 2023-07-18 LAB — URIC ACID: Uric Acid, Serum: 5.3 mg/dL (ref 2.5–7.0)

## 2023-07-18 LAB — RHEUMATOID FACTOR: Rheumatoid fact SerPl-aCnc: 20 IU/mL — ABNORMAL HIGH (ref <14)

## 2023-07-23 ENCOUNTER — Telehealth: Payer: Self-pay | Admitting: Nurse Practitioner

## 2023-07-23 NOTE — Telephone Encounter (Signed)
Patient called regarding Rheumatology referral. I explained to her referral was sent to [facility] via proficient 02/06/23. [facility] closed referral on 04/07/23 since she did not return calls to schedule. I gave her tele # [phone] to see if she can still schedule appointment-Toni

## 2023-07-23 NOTE — Telephone Encounter (Signed)
Sent Rheumatology referral for second time to [facility] via [facility]-Toni

## 2023-08-02 ENCOUNTER — Telehealth: Payer: Self-pay | Admitting: Nurse Practitioner

## 2023-08-02 NOTE — Telephone Encounter (Signed)
Rheumatology appointment 09/04/2023 @ [facility]-Toni

## 2023-08-09 ENCOUNTER — Encounter: Payer: Self-pay | Admitting: Podiatry

## 2023-08-09 DIAGNOSIS — M2012 Hallux valgus (acquired), left foot: Secondary | ICD-10-CM

## 2023-08-14 ENCOUNTER — Ambulatory Visit: Payer: Self-pay | Admitting: Nurse Practitioner

## 2023-08-20 ENCOUNTER — Other Ambulatory Visit: Payer: Self-pay

## 2023-08-20 MED ORDER — AMITRIPTYLINE HCL 25 MG PO TABS: 25.0000 mg | ORAL_TABLET | Freq: Every day | ORAL | 0 refills | Status: DC

## 2023-08-20 NOTE — Telephone Encounter (Signed)
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

## 2023-08-21 ENCOUNTER — Ambulatory Visit: Attending: Podiatry

## 2023-08-21 DIAGNOSIS — M2012 Hallux valgus (acquired), left foot: Secondary | ICD-10-CM | POA: Diagnosis not present

## 2023-08-21 DIAGNOSIS — M79672 Pain in left foot: Secondary | ICD-10-CM | POA: Diagnosis present

## 2023-08-21 DIAGNOSIS — R262 Difficulty in walking, not elsewhere classified: Secondary | ICD-10-CM | POA: Diagnosis present

## 2023-08-22 NOTE — Therapy (Signed)
OUTPATIENT PHYSICAL THERAPY EVALUATION   Patient Name: Jaclyn Gray MRN: [id number] DOB:March 20, 1981, 42 y.o., female Today's Date: 08/22/2023  END OF SESSION:   PT End of Session - 08/22/23 1034     Visit Number 1    Number of Visits 16    Date for PT Re-Evaluation 10/16/23    Authorization Type [employer]    Authorization Time Period 08/21/23-10/16/23    Progress Note Due on Visit 10    PT Start Time 1535    PT Stop Time 1620    PT Time Calculation (min) 45 min    Activity Tolerance Patient tolerated treatment well;No increased pain    Behavior During Therapy [facility] for tasks assessed/performed              Past Medical History:  Diagnosis Date   Low back pain 05/07/2017   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   Past Surgical History:  Procedure Laterality Date   BUNIONECTOMY Right 04/21/2022   Patient Active Problem List   Diagnosis Date Noted   Other osteonecrosis, 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   Elevated rheumatoid factor 04/16/2023   Sprain of sacroiliac region 02/20/2022   Generalized anxiety disorder 05/24/2020   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   Bunion of right foot 07/31/2017   Pain in left foot 07/31/2017   Radial styloid tenosynovitis (de quervain) 06/15/2017   Pain in right hip 06/13/2017   Low back pain 05/07/2017   Neck pain 05/07/2017   Pain in thoracic spine 05/07/2017   Vitamin D deficiency 11/03/2014    PCP: Sallyanne Kuster NP   REFERRING PROVIDER: Ernestene Kiel DPM  REFERRING DIAG: Plantar Fasciitis- Left   Rationale for Evaluation and Treatment: Rehabilitation  THERAPY DIAG:  Pain in left foot  Difficulty in walking, not elsewhere classified  ONSET DATE: End of Spring 2024 upon resuming working out at gym   SUBJECTIVE:                                                                                                                                                                                            SUBJECTIVE STATEMENT: Patient arrives for evaluation of Left foot pain, concerned it may be related to recent problems with Rt foot and recent diagnosis of RA.   PERTINENT HISTORY:  Jaclyn Gray is a 42yoF who is referred to OPPT from podiatry after ongoing left foot pain. Pt reports pain at the plantar surface of the left foot ~1 inch proximal the 1st and 2nd MTPJ about  1-2 inches in length, 8/10 at worst and 3/10 at best. Pt denies  morning pain, denies any interference with 3x/week running ~1 mile on treadmill. Pt reports normal tolerance to barefoot/ ad lib crocs in home. Pt works FT for [employer], wears combat boots while at work, typically seated for most job duties, but has had elevated pain during [employer]. Pt says pain can be worse with sitting activity, but she noticed pain upon resuming gym workouts a few months ago which heightened during a ruck. Pt is recently postpartum, but denies any peripartum foot issues. Pt denies prior history of PF or foot pain. Pt denies heel pain. Pt is s/p Rt sided bunionectomy with chronic pain. Pt was recently told she has Rheumatoid Arthritis and that this is playing a role in her Rt bunion issues. Pt is being concurrently seen for PT in [facility] for neck pain from an unrelated injury.   PAIN:  Are you having pain? Yes; Left foot plantar surface between 1st and 2nd rays ~ inch proximal the 1st/2nd MTPJ, tearing sensation  PRECAUTIONS: None  WEIGHT BEARING RESTRICTIONS: No  FALLS:  Has soldier fallen in last 6 months? 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  None seen for Left foot issue.   PATIENT SURVEYS:  FOTO: defer to visit 2   SENSATION: WNL bilat feet/ankles, no acute changes   MUSCLE LENGTH: Gastroc/Soleus: short bilat Flexor hallucis longus: short left, unable to test Rt due to hallux rigidus  POSTURE: pretty good   PALPATION: pain with palpation to plantar foot surface at place described in symptoms, no significant change with lengthening/slacking various muscles in foot/ankle.   ROM:   ROM 08/21/23 Right  Left  Ankle DF (active)^ 12* 12*  Hallux Extension  NT 45*    ^active and passive ankle DF intensifies foot pain, indicative of passive stretch pain of plantar tissue in first ray, suspect partial if not full involvement of flexor hallucis longus tendon given other parts of exam. This phenomenon is no changed with alterations in sciatic tension and is not reproduced with sciatic tensioning alone. I.e. Passive SLR test (-)   FUNCTIONAL TESTS:  Windlass assessment of hallux extension: loss of ~50% hallux extension left with transition from plantarflexion to dorsiflexion  SLS 3x30sec: medial/lateral righting is supination dominant, MTP1 frequently off floor during righting strategies, hallux flexion dominate pronation righting patterns with absence of ankle everter function through 1st MTP; no completed loss of arch collapse seen with over pronation in stance Gait analysis overground AMB: deferred to future visit Shoe analysis: insole wear pattern indicates 1st MTP wear-pattern distal the break of the shoe by ~1-1.5cm indicating sizing too small, toes wear pattern appear even from digits 1-4, imprinted at distal edge of insole indicating potential for excessive toe/shoe interface, outsoles without excessive wear, mostly seen at the MTPJ area of boot at digits 2-4, no insole evidence of recurrent hallux gripping Foot analysis: presence of medial hallux callus from  recurrent torsional forces sustained during forcefull pronation, mild bunion progression, mild hypermobility of 1st ray without pain, no other areas of significant callus formation, additional evidence of asymmetrical  force distribution Standing toe extension transition from 2 foot to single foot loading: (+) loss of extension in hallux indicative of weakness of intrinsic/extrinsic foot/arch stabilizers    TODAY'S TREATMENT 08/22/23 -education on shoe sizing details and strategies for shoe shopping for best fitting -results of examination and initial therapeutic goals -HEP education: seated toes extension stretching -HEP education: open chain toe flexion fisting  -HEP education: normal stance all toe extension with cues for foot tripod contact  -HEP education: calf stretch on book  *handout provided for aforementioned exercises      PATIENT EDUCATION:  Education details:   -education on shoe sizing details and strategies for shoe shopping for best fitting -results of examination and initial therapeutic goals -HEP education: seated toes extension stretching -HEP education: open chain toe flexion fisting  -HEP education: normal stance all toe extension with cues for foot tripod contact  -HEP education: calf stretch on book  *handout provided for aforementioned exercises    HOME EXERCISE PROGRAM: 08/21/23: gross toe extension stretch seated, toe flexion isometrics, normal stance toe extension, calf stretch Once symptoms are stable: progress hallux stretch to floor based version to increase force, add in double heel raises (in athletic shoes, NOT combat boots), Figure 4 TB ankle inversion at 1st MTP   ASSESSMENT:  CLINICAL IMPRESSION: Examination revealing of ROM and soft tissue mobility deficits in left foot and ankle with adjacent impairment of motor function. Pt will benefit from physical therapy to address these impairments and restore activity tolerance for resumptions of  unrestricted work, leisure, fitness, and parenting duties.   OBJECTIVE IMPAIRMENTS: Abnormal gait, decreased activity tolerance, decreased balance, difficulty walking, and decreased ROM.   ACTIVITY LIMITATIONS: lifting, sitting, squatting, and locomotion level  PARTICIPATION LIMITATIONS: cleaning, personal finances, interpersonal relationship, community activity, occupation, and yard work  PERSONAL FACTORS: Age, Behavior pattern, Education, Fitness, Past/current experiences, Profession, Sex, Social background, and Time since onset of injury/illness/exacerbation are also affecting patient's functional outcome.   REHAB POTENTIAL: Excellent  CLINICAL DECISION MAKING: Stable/uncomplicated  EVALUATION COMPLEXITY: High   GOALS: Goals reviewed with patient? No  SHORT TERM GOALS: Target date: 09/18/23  Pt to report consistent pain fluctuations with activity with out any exacerbation >5/10 pain in most recent 7 days.  Baseline: 8/10 worst pain  Goal status: INITIAL  2.  Pt to report consistent performance of HEP with subjective reduction in pain and/or improvement in foot mobility.  Baseline: No HEP given  Goal status: INITIAL  LONG TERM GOALS: Target date: 10/16/23  Pt to report pain free status in left foot (<3/10) for >3 days/week.  Baseline: pain every day   Goal status: INITIAL  2.  FOTO score improvement >12 points.  Baseline: pending Goal status: INITIAL  PLAN:  PT FREQUENCY: 2x/week  PT DURATION: 8 weeks  PLANNED INTERVENTIONS: Therapeutic exercises, Therapeutic activity, Neuromuscular re-education, Balance training, Gait training, Patient/Family education, Self Care, Joint mobilization, Stair training, Orthotic/Fit training, DME instructions, and Dry Needling.  PLAN FOR NEXT SESSION: Review HEP, FOTO survey, gait analysis in boots and in shoes, calf stretching, calf stretching, dry needling flexor hallucis longus   Alazae Crymes C, PT 08/22/2023, 9:25 AM   10:28  AM, 08/22/23 Rosamaria Lints, PT, DPT Physical Therapist - [facility]  Outpatient Physical Therapy- Main Campus [phone]

## 2023-08-23 NOTE — Therapy (Signed)
OUTPATIENT PHYSICAL THERAPY TREATMENT   Patient Name: Jaclyn Gray MRN: [id number] DOB:January 25, 1981, 42 y.o., female Today's Date: 08/24/2023  END OF SESSION:  PT End of Session - 08/24/23 1103     Visit Number 2    Number of Visits 16    Date for PT Re-Evaluation 10/16/23    Authorization Type [employer]    Authorization Time Period 08/21/23-10/16/23    Progress Note Due on Visit 10    PT Start Time 1018    PT Stop Time 1100    PT Time Calculation (min) 42 min    Activity Tolerance Patient tolerated treatment well;No increased pain    Behavior During Therapy [facility] for tasks assessed/performed             PT End of Session - 08/24/23 1103     Visit Number 2    Number of Visits 16    Date for PT Re-Evaluation 10/16/23    Authorization Type [employer]    Authorization Time Period 08/21/23-10/16/23    Progress Note Due on Visit 10    PT Start Time 1018    PT Stop Time 1100    PT Time Calculation (min) 42 min    Activity Tolerance Patient tolerated treatment well;No increased pain    Behavior During Therapy [facility] for tasks assessed/performed               Past Medical History:  Diagnosis Date   Low back pain 05/07/2017   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   Past Surgical History:  Procedure Laterality Date   BUNIONECTOMY Right 04/21/2022   Patient Active Problem List   Diagnosis Date Noted   Other osteonecrosis, 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   Elevated rheumatoid factor 04/16/2023   Sprain of sacroiliac region 02/20/2022   Generalized anxiety disorder 05/24/2020   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   Bunion of right foot 07/31/2017   Pain in left foot 07/31/2017   Radial styloid tenosynovitis (de quervain) 06/15/2017   Pain in right hip 06/13/2017   Low back pain 05/07/2017   Neck pain 05/07/2017   Pain in thoracic spine 05/07/2017   Vitamin D deficiency 11/03/2014    PCP: Sallyanne Kuster  NP   REFERRING PROVIDER: Ernestene Kiel DPM  REFERRING DIAG: Plantar Fasciitis- Left   Rationale for Evaluation and Treatment: Rehabilitation  THERAPY DIAG:  Pain in left foot  Difficulty in walking, not elsewhere classified  ONSET DATE: End of Spring 2024 upon resuming working out at gym   SUBJECTIVE:  SUBJECTIVE STATEMENT: Patient reports pain slightly increased following initial visit. States really hasn't started the exercises yet.    PERTINENT HISTORY:  Jaclyn Gray is a 42yoF who is referred to OPPT from podiatry after ongoing left foot pain. Pt reports pain at the plantar surface of the left foot ~1 inch proximal the 1st and 2nd MTPJ about  1-2 inches in length, 8/10 at worst and 3/10 at best. Pt denies morning pain, denies any interference with 3x/week running ~1 mile on treadmill. Pt reports normal tolerance to barefoot/ ad lib crocs in home. Pt works FT for [employer], wears combat boots while at work, typically seated for most job duties, but has had elevated pain during [employer]. Pt says pain can be worse with sitting activity, but she noticed pain upon resuming gym workouts a few months ago which heightened during a ruck. Pt is recently postpartum, but denies any peripartum foot issues. Pt denies prior history of PF or foot pain. Pt denies heel pain. Pt is s/p Rt sided bunionectomy with chronic pain. Pt was recently told she has Rheumatoid Arthritis and that this is playing a role in her Rt bunion issues. Pt is being concurrently seen for PT in [facility] for neck pain from an unrelated injury.   PAIN:  Are you having pain? Yes; Left foot plantar surface between 1st and 2nd rays - rates at 7/10- pain some at rest and worse with weightbearing.    PRECAUTIONS: None  WEIGHT BEARING RESTRICTIONS:  No  FALLS:  Has soldier fallen in last 6 months? 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  None seen for Left foot issue.   PATIENT SURVEYS:  FOTO: defer to visit 2   SENSATION: WNL bilat feet/ankles, no acute changes   MUSCLE LENGTH: Gastroc/Soleus: short bilat Flexor hallucis longus: short left, unable to test Rt due to hallux rigidus  POSTURE: pretty good   PALPATION: pain with palpation to plantar foot surface at place described in symptoms, no significant change with lengthening/slacking various muscles in foot/ankle.   ROM:   ROM 08/21/23 Right  Left  Ankle DF (active)^ 12* 12*  Hallux Extension  NT 45*    ^active and passive ankle DF intensifies foot pain, indicative of passive stretch pain of plantar tissue in first ray, suspect partial if not full involvement of flexor hallucis longus tendon given other parts of exam. This phenomenon is no changed with alterations in sciatic tension and is not reproduced with sciatic tensioning alone. I.e. Passive SLR test (-)   FUNCTIONAL TESTS:  Windlass assessment of hallux extension: loss of ~50% hallux extension left with transition from plantarflexion to dorsiflexion  SLS 3x30sec: medial/lateral righting is supination dominant, MTP1 frequently off floor during righting strategies, hallux flexion dominate pronation righting patterns with absence of ankle everter function through 1st MTP; no completed loss of arch collapse seen with over pronation in stance Gait analysis overground AMB: deferred to future  visit Shoe analysis: insole wear pattern indicates 1st MTP wear-pattern distal the break of the shoe by ~1-1.5cm indicating sizing  too small, toes wear pattern appear even from digits 1-4, imprinted at distal edge of insole indicating potential for excessive toe/shoe interface, outsoles without excessive wear, mostly seen at the MTPJ area of boot at digits 2-4, no insole evidence of recurrent hallux gripping Foot analysis: presence of medial hallux callus from recurrent torsional forces sustained during forcefull pronation, mild bunion progression, mild hypermobility of 1st ray without pain, no other areas of significant callus formation, additional evidence of asymmetrical force distribution Standing toe extension transition from 2 foot to single foot loading: (+) loss of extension in hallux indicative of weakness of intrinsic/extrinsic foot/arch stabilizers    TODAY'S TREATMENT 08/24/23 -Re-education on shoe sizing details and strategies for shoe shopping for best fitting.  Patient has not been to any recommendations made at Eval to date. Emphasized importance of compliance with HEP to maximize benefits of rehab -brief assessment of shoes, feet in shoes, feet on floor.  -Palpated area on left foot of concern- general soreness along medial arch yet more specific following flexor hallucis longus tendon -STM to medial plantar aspect /flex hallucis longus Reviewed and patient performed the following:  -HEP education: seated toes into extension stretching- Instructed to hold 30 sec and the possibility of rolling a small ball along medial plantar aspect of foot.  -HEP education: open chain toe flexion fisting  -HEP education: normal stance all toe extension with cues for foot tripod contact  -HEP education: calf stretch on book  (Verbally discussed)  Added:  Arch lift (seated and then standing) x10 reps left LE Toe splay- hold 2-3 sec x 10 each foot Toe yoga- flex great toe while extending 4 toes  then switch to ext great toe and flex 4 toes x 10 reps each        PATIENT EDUCATION:  Education details:   -education on shoe sizing details and strategies for shoe shopping for best fitting -results of examination and initial therapeutic goals -HEP education: seated toes extension stretching -HEP education: open chain toe flexion fisting  -HEP education: normal stance all toe extension with cues for foot tripod contact  -HEP education: calf stretch on book  *handout provided for aforementioned exercises    HOME EXERCISE PROGRAM:  Access Code: [email or web address] URL: [email or web address] Date: 08/24/2023 Prepared by: Maureen Ralphs  Exercises - Arch Lifting  - 1 x daily - 3 x weekly - 3 sets - 10 reps - Toe Spreading  - 1 x daily - 3 x weekly - 3 sets - 10 reps - Seated Self Great Toe Mobilization  - 1 x daily - 3 x weekly - 3 sets - 10 reps - Toe Yoga - Alternating Great Toe and Lesser Toe Extension  - 1 x daily - 3 x weekly - 3 sets - 10 reps    08/21/23: gross toe extension stretch seated, toe flexion isometrics, normal stance toe extension, calf stretch Once symptoms are stable: progress hallux stretch to floor based version to increase force, add in double heel raises (in athletic shoes, NOT combat boots), Figure 4 TB ankle inversion at 1st MTP   HEP FROM 08/21/2023:         ASSESSMENT:  CLINICAL IMPRESSION: Patient presents with good motivation. Continued complaint of left foot pain at rest and worse with weight bearing. She presents with sore and painful flexor hallucis longus and medial arch. Emphasized importance of following PT recommendation/HEP for optimal outcome. Added some basic intrinsic foot strengthening today without report of increased pain. Pt will benefit from physical  therapy to address these impairments and restore activity tolerance for resumptions of unrestricted work, leisure, fitness, and parenting duties.   OBJECTIVE IMPAIRMENTS:  Abnormal gait, decreased activity tolerance, decreased balance, difficulty walking, and decreased ROM.   ACTIVITY LIMITATIONS: lifting, sitting, squatting, and locomotion level  PARTICIPATION LIMITATIONS: cleaning, personal finances, interpersonal relationship, community activity, occupation, and yard work  PERSONAL FACTORS: Age, Behavior pattern, Education, Fitness, Past/current experiences, Profession, Sex, Social background, and Time since onset of injury/illness/exacerbation are also affecting patient's functional outcome.   REHAB POTENTIAL: Excellent  CLINICAL DECISION MAKING: Stable/uncomplicated  EVALUATION COMPLEXITY: High   GOALS: Goals reviewed with patient? No  SHORT TERM GOALS: Target date: 09/18/23  Pt to report consistent pain fluctuations with activity with out any exacerbation >5/10 pain in most recent 7 days.  Baseline: 8/10 worst pain  Goal status: INITIAL  2.  Pt to report consistent performance of HEP with subjective reduction in pain and/or improvement in foot mobility.  Baseline: No HEP given  Goal status: INITIAL  LONG TERM GOALS: Target date: 10/16/23  Pt to report pain free status in left foot (<3/10) for >3 days/week.  Baseline: pain every day   Goal status: INITIAL  2.  FOTO score improvement >12 points.  Baseline: pending Goal status: INITIAL  PLAN:  PT FREQUENCY: 2x/week  PT DURATION: 8 weeks  PLANNED INTERVENTIONS: Therapeutic exercises, Therapeutic activity, Neuromuscular re-education, Balance training, Gait training, Patient/Family education, Self Care, Joint mobilization, Stair training, Orthotic/Fit training, DME instructions, and Dry Needling.  PLAN FOR NEXT SESSION: Review HEP, FOTO survey, gait analysis in boots and in shoes, calf stretching, calf stretching, dry needling flexor hallucis longus   Lenda Kelp, PT 08/24/2023, 11:26 AM   11:26 AM, 08/24/23 Physical Therapist - [facility]   Outpatient Physical Therapy- Main Campus [phone]

## 2023-08-24 ENCOUNTER — Ambulatory Visit

## 2023-08-24 DIAGNOSIS — R262 Difficulty in walking, not elsewhere classified: Secondary | ICD-10-CM

## 2023-08-24 DIAGNOSIS — M79672 Pain in left foot: Secondary | ICD-10-CM | POA: Diagnosis not present

## 2023-08-31 ENCOUNTER — Ambulatory Visit: Attending: Podiatry

## 2023-08-31 DIAGNOSIS — M79672 Pain in left foot: Secondary | ICD-10-CM | POA: Insufficient documentation

## 2023-08-31 DIAGNOSIS — R262 Difficulty in walking, not elsewhere classified: Secondary | ICD-10-CM | POA: Diagnosis present

## 2023-08-31 NOTE — Therapy (Signed)
OUTPATIENT PHYSICAL THERAPY TREATMENT   Patient Name: Jaclyn Gray MRN: [id number] DOB:June 20, 1981, 42 y.o., female Today's Date: 08/31/2023  END OF SESSION:  PT End of Session - 08/31/23 0848     Visit Number 3    Number of Visits 16    Date for PT Re-Evaluation 10/16/23    Authorization Type [employer]    Authorization Time Period 08/21/23-10/16/23    Progress Note Due on Visit 10    PT Start Time 0845    PT Stop Time 0925    PT Time Calculation (min) 40 min    Activity Tolerance Patient tolerated treatment well;No increased pain    Behavior During Therapy [facility] for tasks assessed/performed              PT End of Session - 08/31/23 0848     Visit Number 3    Number of Visits 16    Date for PT Re-Evaluation 10/16/23    Authorization Type [employer]    Authorization Time Period 08/21/23-10/16/23    Progress Note Due on Visit 10    PT Start Time 0845    PT Stop Time 0925    PT Time Calculation (min) 40 min    Activity Tolerance Patient tolerated treatment well;No increased pain    Behavior During Therapy [facility] for tasks assessed/performed                Past Medical History:  Diagnosis Date   Low back pain 05/07/2017   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   Past Surgical History:  Procedure Laterality Date   BUNIONECTOMY Right 04/21/2022   Patient Active Problem List   Diagnosis Date Noted   Other osteonecrosis, 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   Elevated rheumatoid factor 04/16/2023   Sprain of sacroiliac region 02/20/2022   Generalized anxiety disorder 05/24/2020   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   Bunion of right foot 07/31/2017   Pain in left foot 07/31/2017   Radial styloid tenosynovitis (de quervain) 06/15/2017   Pain in right hip 06/13/2017   Low back pain 05/07/2017   Neck pain 05/07/2017   Pain in thoracic spine 05/07/2017   Vitamin D deficiency 11/03/2014    PCP: Jaclyn Kuster NP   REFERRING PROVIDER: Ernestene Gray DPM  REFERRING DIAG: Plantar Fasciitis- Left   Rationale for Evaluation and Treatment: Rehabilitation  THERAPY DIAG:  Pain in left foot  Difficulty in walking, not elsewhere classified  ONSET DATE: End of Spring 2024 upon resuming working out at gym   SUBJECTIVE:  SUBJECTIVE STATEMENT: Patient reports feeling better rates pain at 4/10   PERTINENT HISTORY:  Jaclyn Gray is a 42yoF who is referred to OPPT from podiatry after ongoing left foot pain. Pt reports pain at the plantar surface of the left foot ~1 inch proximal the 1st and 2nd MTPJ about  1-2 inches in length, 8/10 at worst and 3/10 at best. Pt denies morning pain, denies any interference with 3x/week running ~1 mile on treadmill. Pt reports normal tolerance to barefoot/ ad lib crocs in home. Pt works FT for [employer], wears combat boots while at work, typically seated for most job duties, but has had elevated pain during [employer]. Pt says pain can be worse with sitting activity, but she noticed pain upon resuming gym workouts a few months ago which heightened during a ruck. Pt is recently postpartum, but denies any peripartum foot issues. Pt denies prior history of PF or foot pain. Pt denies heel pain. Pt is s/p Rt sided bunionectomy with chronic pain. Pt was recently told she has Rheumatoid Arthritis and that this is playing a role in her Rt bunion issues. Pt is being concurrently seen for PT in [facility] for neck pain from an unrelated injury.   PAIN:  Are you having pain? Yes; Left foot plantar surface between 1st and 2nd rays - rates at 4/10- pain some at rest and worse with weightbearing.    PRECAUTIONS: None  WEIGHT BEARING RESTRICTIONS: No  FALLS:  Has soldier fallen in last 6 months? 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  None seen for Left foot issue.   PATIENT SURVEYS:  FOTO: defer to visit 2   SENSATION: WNL bilat feet/ankles, no acute changes   MUSCLE LENGTH: Gastroc/Soleus: short bilat Flexor hallucis longus: short left, unable to test Rt due to hallux rigidus  POSTURE: pretty good   PALPATION: pain with palpation to plantar foot surface at place described in symptoms, no significant change with lengthening/slacking various muscles in foot/ankle.   ROM:   ROM 08/21/23 Right  Left  Ankle DF (active)^ 12* 12*  Hallux Extension  NT 45*    ^active and passive ankle DF intensifies foot pain, indicative of passive stretch pain of plantar tissue in first ray, suspect partial if not full involvement of flexor hallucis longus tendon given other parts of exam. This phenomenon is no changed with alterations in sciatic tension and is not reproduced with sciatic tensioning alone. I.e. Passive SLR test (-)   FUNCTIONAL TESTS:  Windlass assessment of hallux extension: loss of ~50% hallux extension left with transition from plantarflexion to dorsiflexion  SLS 3x30sec: medial/lateral righting is supination dominant, MTP1 frequently off floor during righting strategies, hallux flexion dominate pronation righting patterns with absence of ankle everter function through 1st MTP; no completed loss of arch collapse seen with over pronation in stance Gait analysis overground AMB: deferred to future visit Shoe analysis: insole wear pattern indicates 1st MTP  wear-pattern distal the break of the shoe by ~1-1.5cm indicating sizing too small, toes wear pattern appear even from  digits 1-4, imprinted at distal edge of insole indicating potential for excessive toe/shoe interface, outsoles without excessive wear, mostly seen at the MTPJ area of boot at digits 2-4, no insole evidence of recurrent hallux gripping Foot analysis: presence of medial hallux callus from recurrent torsional forces sustained during forcefull pronation, mild bunion progression, mild hypermobility of 1st ray without pain, no other areas of significant callus formation, additional evidence of asymmetrical force distribution Standing toe extension transition from 2 foot to single foot loading: (+) loss of extension in hallux indicative of weakness of intrinsic/extrinsic foot/arch stabilizers    TODAY'S TREATMENT 08/31/23  Manual therapy -Palpated area of soreness Left plantar foot- most sore along flexor hallucis longus -STM to medial plantar aspect /flex hallucis longus with some cross friction massage- "crunchy" feeling but no reported increase pain Manual stretching of left foot/plantar fascia, Flex hallucis longer   Therex  seated toes into extension stretching- Instructed to hold 30 sec x3 -Reviewed potential use of a small ball along medial plantar aspect of foot for deep massage.  -open chain toe flexion fisting 2 x 10 - Sitting with  all toe extension with cues for foot tripod contact  - SLS holding up to 5 sec x 2 each side (min report of soreness left foot)  -Toe crunch on towel (seated) 2 x 10 -Arch lift (seated and then standing) x10 reps left LE -Toe splay- hold 2-3 sec x 10 each foot -Toe yoga- flex great toe while extending 4 toes then switch to ext great toe and flex 4 toes x 10 reps each        PATIENT EDUCATION:  Education details:   -education on shoe sizing details and strategies for shoe shopping for best fitting -results of examination and initial  therapeutic goals -HEP education: seated toes extension stretching -HEP education: open chain toe flexion fisting  -HEP education: normal stance all toe extension with cues for foot tripod contact  -HEP education: calf stretch on book  *handout provided for aforementioned exercises    HOME EXERCISE PROGRAM:  Access Code: [email or web address] URL: [email or web address] Date: 08/24/2023 Prepared by: Maureen Ralphs  Exercises - Arch Lifting  - 1 x daily - 3 x weekly - 3 sets - 10 reps - Toe Spreading  - 1 x daily - 3 x weekly - 3 sets - 10 reps - Seated Self Great Toe Mobilization  - 1 x daily - 3 x weekly - 3 sets - 10 reps - Toe Yoga - Alternating Great Toe and Lesser Toe Extension  - 1 x daily - 3 x weekly - 3 sets - 10 reps    08/21/23: gross toe extension stretch seated, toe flexion isometrics, normal stance toe extension, calf stretch Once symptoms are stable: progress hallux stretch to floor based version to increase force, add in double heel raises (in athletic shoes, NOT combat boots), Figure 4 TB ankle inversion at 1st MTP   HEP FROM 08/21/2023:         ASSESSMENT:  CLINICAL IMPRESSION: Treatment continued per plan of care- pain management, ROM, and strengthening. Overall she presents with less pain and improved compliance with HEP. She was responsive to all exercises and improved toe flex/ext today with no increase in pain.  Pt will benefit from physical therapy to address these impairments and restore activity tolerance for resumptions of unrestricted work, leisure, fitness, and parenting duties.   OBJECTIVE IMPAIRMENTS: Abnormal gait, decreased activity tolerance, decreased balance, difficulty walking, and decreased ROM.   ACTIVITY LIMITATIONS:  lifting, sitting, squatting, and locomotion level  PARTICIPATION LIMITATIONS: cleaning, personal finances, interpersonal relationship, community activity, occupation, and yard work  PERSONAL FACTORS: Age, Behavior  pattern, Education, Fitness, Past/current experiences, Profession, Sex, Social background, and Time since onset of injury/illness/exacerbation are also affecting patient's functional outcome.   REHAB POTENTIAL: Excellent  CLINICAL DECISION MAKING: Stable/uncomplicated  EVALUATION COMPLEXITY: High   GOALS: Goals reviewed with patient? No  SHORT TERM GOALS: Target date: 09/18/23  Pt to report consistent pain fluctuations with activity with out any exacerbation >5/10 pain in most recent 7 days.  Baseline: 8/10 worst pain  Goal status: INITIAL  2.  Pt to report consistent performance of HEP with subjective reduction in pain and/or improvement in foot mobility.  Baseline: No HEP given  Goal status: INITIAL  LONG TERM GOALS: Target date: 10/16/23  Pt to report pain free status in left foot (<3/10) for >3 days/week.  Baseline: pain every day   Goal status: INITIAL  2.  FOTO score improvement >12 points.  Baseline: pending Goal status: INITIAL  PLAN:  PT FREQUENCY: 2x/week  PT DURATION: 8 weeks  PLANNED INTERVENTIONS: Therapeutic exercises, Therapeutic activity, Neuromuscular re-education, Balance training, Gait training, Patient/Family education, Self Care, Joint mobilization, Stair training, Orthotic/Fit training, DME instructions, and Dry Needling.  PLAN FOR NEXT SESSION: Review HEP, FOTO survey, gait analysis in boots and in shoes, calf stretching, calf stretching, dry needling flexor hallucis longus   Lenda Kelp, PT 08/31/2023, 9:25 AM   9:25 AM, 08/31/23 Physical Therapist - [facility]  Outpatient Physical Therapy- Main Campus [phone]

## 2023-09-04 ENCOUNTER — Ambulatory Visit: Admitting: Physical Therapy

## 2023-09-04 DIAGNOSIS — M79672 Pain in left foot: Secondary | ICD-10-CM | POA: Diagnosis not present

## 2023-09-04 DIAGNOSIS — R262 Difficulty in walking, not elsewhere classified: Secondary | ICD-10-CM

## 2023-09-04 NOTE — Therapy (Signed)
OUTPATIENT PHYSICAL THERAPY TREATMENT   Patient Name: Jaclyn Gray MRN: [id number] DOB:[birth year], 42 y.o., female Today's Date: 09/04/2023  END OF SESSION:  PT End of Session - 09/04/23 1057     Visit Number 4    Number of Visits 16    Date for PT Re-Evaluation 10/16/23    Authorization Type [employer]    Authorization Time Period 08/21/23-10/16/23    Progress Note Due on Visit 10    PT Start Time 1018    PT Stop Time 1058    PT Time Calculation (min) 40 min    Activity Tolerance Patient tolerated treatment well;No increased pain    Behavior During Therapy [facility] for tasks assessed/performed               Past Medical History:  Diagnosis Date   Low back pain 05/07/2017   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   Past Surgical History:  Procedure Laterality Date   BUNIONECTOMY Right 04/21/2022   Patient Active Problem List   Diagnosis Date Noted   Other osteonecrosis, 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   Elevated rheumatoid factor 04/16/2023   Sprain of sacroiliac region 02/20/2022   Generalized anxiety disorder 05/24/2020   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   Bunion of right foot 07/31/2017   Pain in left foot 07/31/2017   Radial styloid tenosynovitis (de quervain) 06/15/2017   Pain in right hip 06/13/2017   Low back pain 05/07/2017   Neck pain 05/07/2017   Pain in thoracic spine 05/07/2017   Vitamin D deficiency 11/03/2014    PCP: Sallyanne Kuster NP   REFERRING PROVIDER: Ernestene Gray DPM  REFERRING DIAG: Plantar Fasciitis- Left   Rationale for Evaluation and Treatment: Rehabilitation  THERAPY DIAG:  Pain in left foot  Difficulty in walking, not elsewhere classified  ONSET DATE: End of Spring 2024 upon resuming working out at gym   SUBJECTIVE:                                                                                                                                                                                            SUBJECTIVE STATEMENT: Patient reports feeling sore this morning. 6/10 in the L foot. Relaxed weekend, but busy week ahead. States that she went to [employer] store to obtain shoe fitting and proper insole. Was instructed to but rigid shoe and    PERTINENT HISTORY:  Jaclyn Gray is a 42yoF who is referred to OPPT from podiatry after ongoing left foot pain. Pt reports pain at the plantar surface of the left foot ~1 inch proximal the 1st  and 2nd MTPJ about  1-2 inches in length, 8/10 at worst and 3/10 at best. Pt denies morning pain, denies any interference with 3x/week running ~1 mile on treadmill. Pt reports normal tolerance to barefoot/ ad lib crocs in home. Pt works FT for [employer], wears combat boots while at work, typically seated for most job duties, but has had elevated pain during [employer]. Pt says pain can be worse with sitting activity, but she noticed pain upon resuming gym workouts a few months ago which heightened during a ruck. Pt is recently postpartum, but denies any peripartum foot issues. Pt denies prior history of PF or foot pain. Pt denies heel pain. Pt is s/p Rt sided bunionectomy with chronic pain. Pt was recently told she has Rheumatoid Arthritis and that this is playing a role in her Rt bunion issues. Pt is being concurrently seen for PT in [facility] for neck pain from an unrelated injury.   PAIN:  Are you having pain? Yes; Left foot plantar surface between 1st and 2nd rays - rates at 4/10- pain some at rest and worse with weightbearing.    PRECAUTIONS: None  WEIGHT BEARING RESTRICTIONS: No  FALLS:  Has soldier fallen in last 6 months? 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  None seen for Left foot issue.   PATIENT SURVEYS:  FOTO: defer to visit 2   SENSATION: WNL bilat feet/ankles, no acute changes   MUSCLE LENGTH: Gastroc/Soleus: short bilat Flexor hallucis longus: short left, unable to test Rt due to hallux rigidus  POSTURE: pretty good   PALPATION: pain with palpation to plantar foot surface at place described in symptoms, no significant change with lengthening/slacking various muscles in foot/ankle.   ROM:   ROM 08/21/23 Right  Left  Ankle DF (active)^ 12* 12*  Hallux Extension  NT 45*    ^active and passive ankle DF intensifies foot pain, indicative of passive stretch pain of plantar tissue in first ray, suspect partial if not full involvement of flexor hallucis longus tendon given other parts of exam. This phenomenon is no changed with alterations in sciatic tension and is not reproduced with sciatic tensioning alone. I.e. Passive SLR test (-)   FUNCTIONAL TESTS:  Windlass assessment of hallux extension: loss of ~50% hallux extension left with transition from plantarflexion to dorsiflexion  SLS 3x30sec: medial/lateral righting is supination dominant, MTP1 frequently off floor during righting strategies, hallux flexion dominate pronation righting patterns with absence of ankle everter function through 1st MTP; no completed loss of arch collapse seen with over pronation in stance Gait analysis overground AMB: deferred to future visit Shoe analysis: insole wear pattern indicates 1st MTP wear-pattern distal the break of the shoe by ~1-1.5cm indicating sizing too small, toes wear pattern appear even from digits 1-4, imprinted at distal edge of insole indicating potential for excessive toe/shoe interface, outsoles without excessive wear, mostly seen at the MTPJ area of boot at digits  2-4, no insole evidence of recurrent hallux gripping Foot analysis: presence of medial hallux callus from recurrent torsional forces sustained  during forcefull pronation, mild bunion progression, mild hypermobility of 1st ray without pain, no other areas of significant callus formation, additional evidence of asymmetrical force distribution Standing toe extension transition from 2 foot to single foot loading: (+) loss of extension in hallux indicative of weakness of intrinsic/extrinsic foot/arch stabilizers    TODAY'S TREATMENT 09/04/23  Manual therapy -Palpated area of soreness Left plantar foot- most sore along flexor hallucis longus -STM to medial plantar aspect /flex hallucis longus with some cross friction massage- "crunchy" feeling but no reported increase pain -STM to soleus and deep toe/hallucis flexor muscle bellies in  Manual stretching of left foot/plantar fascia, Flex hallucis longer. Joint mobs grade 2-3 ankle A>P to improve DF 3 x 30 sec  Great toe P>A mobilization to improve extension ROM 3 x 30 sec grade 2-3   Therex  seated toes into extension stretching- Instructed to hold 5-6 sec hold sec 20 -open chain toe flexion fisting/extension 2 x 10 Long sitting toe yoga with poor great toe extension. X 10  - Sitting with  all toe extension with cues for foot tripod contact  -Arch lift seatedx 15 reps left LE -Toe yoga- flex great toe while extending 4 toes then switch to ext great toe and flex 4 toes x 10 reps each - SLS holding up to 15 sec x 3 each side  on airex pad with shoes, finger tip support on wall.    Pt reports decreased pain to 3-4/10 upon completion of PT treatment.     PATIENT EDUCATION:  Education details:   -education on shoe sizing details and strategies for shoe shopping for best fitting  Education for wear schedule to allow adaptations with new position of foot in more rigid shoe and with insole in place.   -results of examination and initial therapeutic  goals -HEP education: seated toes extension stretching -HEP education: open chain toe flexion fisting  -HEP education: normal stance all toe extension with cues for foot tripod contact  -HEP education: calf stretch on book  *handout provided for aforementioned exercises    HOME EXERCISE PROGRAM:  Access Code: [email or web address] URL: [email or web address] Date: 08/24/2023 Prepared by: Maureen Ralphs  Exercises - Arch Lifting  - 1 x daily - 3 x weekly - 3 sets - 10 reps - Toe Spreading  - 1 x daily - 3 x weekly - 3 sets - 10 reps - Seated Self Great Toe Mobilization  - 1 x daily - 3 x weekly - 3 sets - 10 reps - Toe Yoga - Alternating Great Toe and Lesser Toe Extension  - 1 x daily - 3 x weekly - 3 sets - 10 reps    08/21/23: gross toe extension stretch seated, toe flexion isometrics, normal stance toe extension, calf stretch Once symptoms are stable: progress hallux stretch to floor based version to increase force, add in double heel raises (in athletic shoes, NOT combat boots), Figure 4 TB ankle inversion at 1st MTP   HEP FROM 08/21/2023:         ASSESSMENT:  CLINICAL IMPRESSION: Treatment continued per plan of care- pain management, ROM, and strengthening. Pt demonstrating increased ankle ROM and great toe extension ROM AROM and PROM following manual therapy. Noted to have difficulty with isolated great toe extension. Education for safe transition to more rigid shoes and use of insole to reduce risk of injury with change in foot position with physical activity.  Pt will benefit from physical therapy to address these impairments and restore activity tolerance  for resumptions of unrestricted work, leisure, fitness, and parenting duties.   OBJECTIVE IMPAIRMENTS: Abnormal gait, decreased activity tolerance, decreased balance, difficulty walking, and decreased ROM.   ACTIVITY LIMITATIONS: lifting, sitting, squatting, and locomotion level  PARTICIPATION LIMITATIONS:  cleaning, personal finances, interpersonal relationship, community activity, occupation, and yard work  PERSONAL FACTORS: Age, Behavior pattern, Education, Fitness, Past/current experiences, Profession, Sex, Social background, and Time since onset of injury/illness/exacerbation are also affecting patient's functional outcome.   REHAB POTENTIAL: Excellent  CLINICAL DECISION MAKING: Stable/uncomplicated  EVALUATION COMPLEXITY: High   GOALS: Goals reviewed with patient? No  SHORT TERM GOALS: Target date: 09/18/23  Pt to report consistent pain fluctuations with activity with out any exacerbation >5/10 pain in most recent 7 days.  Baseline: 8/10 worst pain  Goal status: INITIAL  2.  Pt to report consistent performance of HEP with subjective reduction in pain and/or improvement in foot mobility.  Baseline: No HEP given  Goal status: INITIAL  LONG TERM GOALS: Target date: 10/16/23  Pt to report pain free status in left foot (<3/10) for >3 days/week.  Baseline: pain every day   Goal status: INITIAL  2.  FOTO score improvement >12 points.  Baseline: pending Goal status: INITIAL  PLAN:  PT FREQUENCY: 2x/week  PT DURATION: 8 weeks  PLANNED INTERVENTIONS: Therapeutic exercises, Therapeutic activity, Neuromuscular re-education, Balance training, Gait training, Patient/Family education, Self Care, Joint mobilization, Stair training, Orthotic/Fit training, DME instructions, and Dry Needling.  PLAN FOR NEXT SESSION:   Review HEP, FOTO survey,   calf stretching, calf stretching, dry needling flexor hallucis longus   Golden Pop, PT 09/04/2023, 11:03 AM   11:03 AM, 09/04/23 Physical Therapist - [facility]  Outpatient Physical Therapy- Main Campus [phone]

## 2023-09-10 NOTE — Therapy (Signed)
OUTPATIENT PHYSICAL THERAPY TREATMENT   Patient Name: Jaclyn Gray MRN: [id number] DOB:January 26, 1981, 42 y.o., female Today's Date: 09/11/2023  END OF SESSION:  PT End of Session - 09/11/23 1112     Visit Number 5    Number of Visits 16    Date for PT Re-Evaluation 10/16/23    Authorization Type [employer]    Authorization Time Period 08/21/23-10/16/23    Progress Note Due on Visit 10    PT Start Time 1111    PT Stop Time 1144    PT Time Calculation (min) 33 min    Activity Tolerance Patient tolerated treatment well;No increased pain    Behavior During Therapy [facility] for tasks assessed/performed                Past Medical History:  Diagnosis Date   Low back pain 05/07/2017   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   Past Surgical History:  Procedure Laterality Date   BUNIONECTOMY Right 04/21/2022   Patient Active Problem List   Diagnosis Date Noted   Other osteonecrosis, 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   Elevated rheumatoid factor 04/16/2023   Sprain of sacroiliac region 02/20/2022   Generalized anxiety disorder 05/24/2020   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   Bunion of right foot 07/31/2017   Pain in left foot 07/31/2017   Radial styloid tenosynovitis (de quervain) 06/15/2017   Pain in right hip 06/13/2017   Low back pain 05/07/2017   Neck pain 05/07/2017   Pain in thoracic spine 05/07/2017   Vitamin D deficiency 11/03/2014    PCP: Sallyanne Kuster NP   REFERRING PROVIDER: Ernestene Kiel DPM  REFERRING DIAG: Plantar Fasciitis- Left   Rationale for Evaluation and Treatment: Rehabilitation  THERAPY DIAG:  Pain in left foot  Difficulty in walking, not elsewhere classified  ONSET DATE: End of Spring 2024 upon resuming working out at gym   SUBJECTIVE:                                                                                                                                                                                            SUBJECTIVE STATEMENT: Patient late due to having meeting.    PERTINENT HISTORY:  Jaclyn Gray is a 42yoF who is referred to OPPT from podiatry after ongoing left foot pain. Pt reports pain at the plantar surface of the left foot ~1 inch proximal the 1st and 2nd MTPJ about  1-2 inches in length, 8/10 at worst and 3/10 at best. Pt denies morning pain, denies any interference with 3x/week running ~1 mile on treadmill. Pt reports normal tolerance  to barefoot/ ad lib crocs in home. Pt works FT for [employer], wears combat boots while at work, typically seated for most job duties, but has had elevated pain during [employer]. Pt says pain can be worse with sitting activity, but she noticed pain upon resuming gym workouts a few months ago which heightened during a ruck. Pt is recently postpartum, but denies any peripartum foot issues. Pt denies prior history of PF or foot pain. Pt denies heel pain. Pt is s/p Rt sided bunionectomy with chronic pain. Pt was recently told she has Rheumatoid Arthritis and that this is playing a role in her Rt bunion issues. Pt is being concurrently seen for PT in [facility] for neck pain from an unrelated injury.   PAIN:  Are you having pain? Yes; Left foot plantar surface between 1st and 2nd rays - rates at 4/10- pain some at rest and worse with weightbearing.    PRECAUTIONS: None  WEIGHT BEARING RESTRICTIONS: No  FALLS:  Has soldier fallen in last 6 months? 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  None seen for  Left foot issue.   PATIENT SURVEYS:  FOTO: defer to visit 2   SENSATION: WNL bilat feet/ankles, no acute changes   MUSCLE LENGTH: Gastroc/Soleus: short bilat Flexor hallucis longus: short left, unable to test Rt due to hallux rigidus  POSTURE: pretty good   PALPATION: pain with palpation to plantar foot surface at place described in symptoms, no significant change with lengthening/slacking various muscles in foot/ankle.   ROM:   ROM 08/21/23 Right  Left  Ankle DF (active)^ 12* 12*  Hallux Extension  NT 45*    ^active and passive ankle DF intensifies foot pain, indicative of passive stretch pain of plantar tissue in first ray, suspect partial if not full involvement of flexor hallucis longus tendon given other parts of exam. This phenomenon is no changed with alterations in sciatic tension and is not reproduced with sciatic tensioning alone. I.e. Passive SLR test (-)   FUNCTIONAL TESTS:  Windlass assessment of hallux extension: loss of ~50% hallux extension left with transition from plantarflexion to dorsiflexion  SLS 3x30sec: medial/lateral righting is supination dominant, MTP1 frequently off floor during righting strategies, hallux flexion dominate pronation righting patterns with absence of ankle everter function through 1st MTP; no completed loss of arch collapse seen with over pronation in stance Gait analysis overground AMB: deferred to future visit Shoe analysis: insole wear pattern indicates 1st MTP wear-pattern distal the break of the shoe by ~1-1.5cm indicating sizing too small, toes wear pattern appear even from digits 1-4, imprinted at distal edge of insole indicating potential for excessive toe/shoe interface, outsoles without excessive wear, mostly seen at the MTPJ area of boot at digits 2-4, no insole evidence of recurrent hallux gripping Foot analysis: presence of medial hallux callus from recurrent torsional forces sustained during forcefull pronation, mild bunion  progression, mild hypermobility of 1st ray without pain, no other areas of significant callus formation, additional evidence of asymmetrical force distribution Standing toe extension transition from 2 foot to  single foot loading: (+) loss of extension in hallux indicative of weakness of intrinsic/extrinsic foot/arch stabilizers    TODAY'S TREATMENT 09/11/23  Manual therapy -Palpated area of soreness Left plantar foot- most sore along flexor hallucis longus -STM to medial plantar aspect /flex hallucis longus with some cross friction massage- "crunchy" feeling but no reported increase pain Manual stretching of left foot/plantar fascia, Flex hallucis longer. Joint mobs grade 2-3 ankle A>P to improve DF 3 x 30 sec  Great toe P>A mobilization to improve extension ROM 3 x 30 sec grade 2-3   Therex  seated toes into extension stretching- Instructed to hold 5-6 sec hold sec 20 Arch formation 10x  -seated toe press into ground, inversion/eversion 10x - Sitting with  all toe extension with cues for foot tripod contact  -Arch lift seatedx 15 reps left LE Long sitting: -toe extension 10x with AAROM -toe abduction 10x    PATIENT EDUCATION:  Education details:   -education on shoe sizing details and strategies for shoe shopping for best fitting  Education for wear schedule to allow adaptations with new position of foot in more rigid shoe and with insole in place.   -results of examination and initial therapeutic goals -HEP education: seated toes extension stretching -HEP education: open chain toe flexion fisting  -HEP education: normal stance all toe extension with cues for foot tripod contact  -HEP education: calf stretch on book  *handout provided for aforementioned exercises    HOME EXERCISE PROGRAM:  Access Code: [email or web address] URL: [email or web address] Date: 08/24/2023 Prepared by: Maureen Ralphs  Exercises - Arch Lifting  - 1 x daily - 3 x weekly - 3 sets - 10  reps - Toe Spreading  - 1 x daily - 3 x weekly - 3 sets - 10 reps - Seated Self Great Toe Mobilization  - 1 x daily - 3 x weekly - 3 sets - 10 reps - Toe Yoga - Alternating Great Toe and Lesser Toe Extension  - 1 x daily - 3 x weekly - 3 sets - 10 reps    08/21/23: gross toe extension stretch seated, toe flexion isometrics, normal stance toe extension, calf stretch Once symptoms are stable: progress hallux stretch to floor based version to increase force, add in double heel raises (in athletic shoes, NOT combat boots), Figure 4 TB ankle inversion at 1st MTP   HEP FROM 08/21/2023:         ASSESSMENT:  CLINICAL IMPRESSION:   Patient session limited by late arrival. Patient eager to progress her pain free mobility. Significant adhesions reduced with STM this session. Big toe extension is significantly limited actively and passively requiring assistance throughout session. Significant arch drop improved with cueing. Pt will benefit from physical therapy to address these impairments and restore activity tolerance for resumptions of unrestricted work, leisure, fitness, and parenting duties.   OBJECTIVE IMPAIRMENTS: Abnormal gait, decreased activity tolerance, decreased balance, difficulty walking, and decreased ROM.   ACTIVITY LIMITATIONS: lifting, sitting, squatting, and locomotion level  PARTICIPATION LIMITATIONS: cleaning, personal finances, interpersonal relationship, community activity, occupation, and yard work  PERSONAL FACTORS: Age, Behavior pattern, Education, Fitness, Past/current experiences, Profession, Sex, Social background, and Time since onset of injury/illness/exacerbation are also affecting patient's functional outcome.   REHAB POTENTIAL: Excellent  CLINICAL DECISION MAKING: Stable/uncomplicated  EVALUATION COMPLEXITY: High   GOALS: Goals reviewed with patient? No  SHORT TERM GOALS: Target date: 09/18/23  Pt to report consistent pain fluctuations with activity with  out any exacerbation >5/10 pain in most recent  7 days.  Baseline: 8/10 worst pain  Goal status: INITIAL  2.  Pt to report consistent performance of HEP with subjective reduction in pain and/or improvement in foot mobility.  Baseline: No HEP given  Goal status: INITIAL  LONG TERM GOALS: Target date: 10/16/23  Pt to report pain free status in left foot (<3/10) for >3 days/week.  Baseline: pain every day   Goal status: INITIAL  2.  FOTO score improvement >12 points.  Baseline: pending Goal status: INITIAL  PLAN:  PT FREQUENCY: 2x/week  PT DURATION: 8 weeks  PLANNED INTERVENTIONS: Therapeutic exercises, Therapeutic activity, Neuromuscular re-education, Balance training, Gait training, Patient/Family education, Self Care, Joint mobilization, Stair training, Orthotic/Fit training, DME instructions, and Dry Needling.  PLAN FOR NEXT SESSION:   Review HEP, FOTO survey,   calf stretching, calf stretching, dry needling flexor hallucis longus   Precious Bard, PT 09/11/2023, 11:48 AM   11:48 AM, 09/11/23 Physical Therapist - [facility]  Outpatient Physical Therapy- Main Campus [phone]

## 2023-09-11 ENCOUNTER — Ambulatory Visit

## 2023-09-11 DIAGNOSIS — M79672 Pain in left foot: Secondary | ICD-10-CM

## 2023-09-11 DIAGNOSIS — R262 Difficulty in walking, not elsewhere classified: Secondary | ICD-10-CM

## 2023-09-13 ENCOUNTER — Ambulatory Visit

## 2023-09-13 DIAGNOSIS — R262 Difficulty in walking, not elsewhere classified: Secondary | ICD-10-CM

## 2023-09-13 DIAGNOSIS — M79672 Pain in left foot: Secondary | ICD-10-CM

## 2023-09-13 NOTE — Therapy (Signed)
OUTPATIENT PHYSICAL THERAPY TREATMENT   Patient Name: Jaclyn Gray MRN: [id number] DOB:1981/01/15, 42 y.o., female Today's Date: 09/13/2023  END OF SESSION:  PT End of Session - 09/13/23 1116     Visit Number 6    Number of Visits 16    Date for PT Re-Evaluation 10/16/23    Authorization Type [employer]    Authorization Time Period 08/21/23-10/16/23    Progress Note Due on Visit 10    PT Start Time 1116    PT Stop Time 1145    PT Time Calculation (min) 29 min    Activity Tolerance Patient tolerated treatment well;No increased pain    Behavior During Therapy [facility] for tasks assessed/performed                Past Medical History:  Diagnosis Date   Low back pain 05/07/2017   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   Past Surgical History:  Procedure Laterality Date   BUNIONECTOMY Right 04/21/2022   Patient Active Problem List   Diagnosis Date Noted   Other osteonecrosis, 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   Elevated rheumatoid factor 04/16/2023   Sprain of sacroiliac region 02/20/2022   Generalized anxiety disorder 05/24/2020   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   Bunion of right foot 07/31/2017   Pain in left foot 07/31/2017   Radial styloid tenosynovitis (de quervain) 06/15/2017   Pain in right hip 06/13/2017   Low back pain 05/07/2017   Neck pain 05/07/2017   Pain in thoracic spine 05/07/2017   Vitamin D deficiency 11/03/2014    PCP: Sallyanne Kuster NP   REFERRING PROVIDER: Ernestene Kiel DPM  REFERRING DIAG: Plantar Fasciitis- Left   Rationale for Evaluation and Treatment: Rehabilitation  THERAPY DIAG:  Pain in left foot  Difficulty in walking, not elsewhere classified  ONSET DATE: End of Spring 2024 upon resuming working out at gym   SUBJECTIVE:                                                                                                                                                                                            SUBJECTIVE STATEMENT:  Pt arrived late to the clinic due to another meeting.      PERTINENT HISTORY:  Jaclyn Gray is a 42yoF who is referred to OPPT from podiatry after ongoing left foot pain. Pt reports pain at the plantar surface of the left foot ~1 inch proximal the 1st and 2nd MTPJ about  1-2 inches in length, 8/10 at worst and 3/10 at best. Pt denies morning pain, denies any interference with 3x/week running ~1  mile on treadmill. Pt reports normal tolerance to barefoot/ ad lib crocs in home. Pt works FT for [employer], wears combat boots while at work, typically seated for most job duties, but has had elevated pain during [employer]. Pt says pain can be worse with sitting activity, but she noticed pain upon resuming gym workouts a few months ago which heightened during a ruck. Pt is recently postpartum, but denies any peripartum foot issues. Pt denies prior history of PF or foot pain. Pt denies heel pain. Pt is s/p Rt sided bunionectomy with chronic pain. Pt was recently told she has Rheumatoid Arthritis and that this is playing a role in her Rt bunion issues. Pt is being concurrently seen for PT in [facility] for neck pain from an unrelated injury.   PAIN:  Are you having pain? Yes; Left foot plantar surface between 1st and 2nd rays - rates at 4/10- pain some at rest and worse with weightbearing.    PRECAUTIONS: None  WEIGHT BEARING RESTRICTIONS: No  FALLS:  Has soldier fallen in last 6 months? 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  None seen for Left foot issue.   PATIENT SURVEYS:  FOTO: defer to visit 2   SENSATION: WNL bilat feet/ankles, no acute changes   MUSCLE LENGTH: Gastroc/Soleus: short bilat Flexor hallucis longus: short left, unable to test Rt due to hallux rigidus  POSTURE: pretty good   PALPATION: pain with palpation to plantar foot surface at place described in symptoms, no significant change with lengthening/slacking various muscles in foot/ankle.   ROM:   ROM 08/21/23 Right  Left  Ankle DF (active)^ 12* 12*  Hallux Extension  NT 45*    ^active and passive ankle DF intensifies foot pain, indicative of passive stretch pain of plantar tissue in first ray, suspect partial if not full involvement of flexor hallucis longus tendon given other parts of exam. This phenomenon is no changed with alterations in sciatic tension and is not reproduced with sciatic tensioning alone. I.e. Passive SLR test (-)   FUNCTIONAL TESTS:  Windlass assessment of hallux extension: loss of ~50% hallux extension left with transition from plantarflexion to dorsiflexion  SLS 3x30sec: medial/lateral righting is supination dominant, MTP1 frequently off floor during righting strategies, hallux flexion dominate pronation righting patterns with absence of ankle everter function through 1st MTP; no completed loss of arch collapse seen with over pronation in stance Gait analysis overground AMB: deferred to future visit Shoe analysis: insole wear pattern indicates 1st MTP wear-pattern distal the break of the shoe by ~1-1.5cm indicating sizing too small, toes wear pattern appear even from digits 1-4, imprinted at distal edge of insole indicating potential for excessive toe/shoe interface, outsoles without excessive wear, mostly seen at the MTPJ area of boot at digits 2-4, no insole evidence of recurrent hallux gripping Foot analysis: presence of medial hallux callus from recurrent torsional forces sustained during forcefull  pronation, mild bunion progression, mild hypermobility of 1st ray without pain, no other areas of significant callus formation, additional evidence of asymmetrical force distribution Standing  toe extension transition from 2 foot to single foot loading: (+) loss of extension in hallux indicative of weakness of intrinsic/extrinsic foot/arch stabilizers    TODAY'S TREATMENT DATE: 09/13/23    Manual:   Long sitting distal tibiofibular AP mobilization, Grades III-IV for increased ROM of the L ankle, 30 sec bouts x multiple bouts  Long sitting talocrural AP mobilization, Grades III-IV for increased ROM of the L ankle, 30 sec bouts x multiple bouts  Long sitting metatarsal fan mobilization, Grades III-IV for increased mobility of the L metatarsals, 30 sec bouts in each segment  Long sitting STM to the plantar fascia with use of cross friction and deep tissue massage for increased tissue extensibility  Long sitting manual stretching of left foot/plantar fascia with great toe extension  Great toe distraction for increased joint spacing allowing for more mobility during gait mechanics    PATIENT EDUCATION:  Education details:   -education on shoe sizing details and strategies for shoe shopping for best fitting  Education for wear schedule to allow adaptations with new position of foot in more rigid shoe and with insole in place.   -results of examination and initial therapeutic goals -HEP education: seated toes extension stretching -HEP education: open chain toe flexion fisting  -HEP education: normal stance all toe extension with cues for foot tripod contact  -HEP education: calf stretch on book  *handout provided for aforementioned exercises    HOME EXERCISE PROGRAM:  Access Code: [email or web address] URL: [email or web address] Date: 08/24/2023 Prepared by: Maureen Ralphs  Exercises - Arch Lifting  - 1 x daily - 3 x weekly - 3 sets - 10 reps - Toe Spreading  - 1 x daily -  3 x weekly - 3 sets - 10 reps - Seated Self Great Toe Mobilization  - 1 x daily - 3 x weekly - 3 sets - 10 reps - Toe Yoga - Alternating Great Toe and Lesser Toe Extension  - 1 x daily - 3 x weekly - 3 sets - 10 reps    08/21/23: gross toe extension stretch seated, toe flexion isometrics, normal stance toe extension, calf stretch Once symptoms are stable: progress hallux stretch to floor based version to increase force, add in double heel raises (in athletic shoes, NOT combat boots), Figure 4 TB ankle inversion at 1st MTP   HEP FROM 08/21/2023:         ASSESSMENT:  CLINICAL IMPRESSION:  Pt responded well to the manual therapy approach, however was limited due to pt's late arrival to the clinic.  Pt self-reports that she is unsure if the pain is going to ever go away, or if she will just have to "deal with it".  Pt notes that the pain has centralized to the medial central portion of the plantar fascia instead of the entire arch now, which she is thankful for.   Pt will continue to benefit from skilled therapy to address remaining deficits in order to improve overall QoL and return to PLOF.     OBJECTIVE IMPAIRMENTS: Abnormal gait, decreased activity tolerance, decreased balance, difficulty walking, and decreased ROM.   ACTIVITY LIMITATIONS: lifting, sitting, squatting, and locomotion level  PARTICIPATION LIMITATIONS: cleaning, personal finances, interpersonal relationship, community activity, occupation, and yard work  PERSONAL FACTORS: Age, Behavior pattern, Education, Fitness, Past/current experiences, Profession, Sex, Social background, and Time since onset of injury/illness/exacerbation are also affecting patient's functional outcome.   REHAB POTENTIAL: Excellent  CLINICAL DECISION MAKING: Stable/uncomplicated  EVALUATION COMPLEXITY: High   GOALS: Goals reviewed with  patient? No  SHORT TERM GOALS: Target date: 09/18/23  Pt to report consistent pain fluctuations with  activity with out any exacerbation >5/10 pain in most recent 7 days.  Baseline: 8/10 worst pain  Goal status: INITIAL  2.  Pt to report consistent performance of HEP with subjective reduction in pain and/or improvement in foot mobility.  Baseline: No HEP given  Goal status: INITIAL  LONG TERM GOALS: Target date: 10/16/23  Pt to report pain free status in left foot (<3/10) for >3 days/week.  Baseline: pain every day   Goal status: INITIAL  2.  FOTO score improvement >12 points.  Baseline:  Goal status: INITIAL  PLAN:  PT FREQUENCY: 2x/week  PT DURATION: 8 weeks  PLANNED INTERVENTIONS: Therapeutic exercises, Therapeutic activity, Neuromuscular re-education, Balance training, Gait training, Patient/Family education, Self Care, Joint mobilization, Stair training, Orthotic/Fit training, DME instructions, and Dry Needling.  PLAN FOR NEXT SESSION:   Review HEP, FOTO survey,   calf stretching, calf stretching, dry needling flexor hallucis longus   Nolon Bussing, PT, DPT Physical Therapist - [facility]  [facility]  09/13/23, 4:44 PM

## 2023-09-18 ENCOUNTER — Ambulatory Visit

## 2023-09-20 ENCOUNTER — Ambulatory Visit

## 2023-09-20 DIAGNOSIS — M79672 Pain in left foot: Secondary | ICD-10-CM

## 2023-09-20 DIAGNOSIS — R262 Difficulty in walking, not elsewhere classified: Secondary | ICD-10-CM

## 2023-09-20 NOTE — Therapy (Signed)
OUTPATIENT PHYSICAL THERAPY TREATMENT   Patient Name: Jaclyn Gray MRN: [id number] DOB:[birth year], 42 y.o., female Today's Date: 09/20/2023  END OF SESSION:  PT End of Session - 09/20/23 1108     Visit Number 7    Number of Visits 16    Date for PT Re-Evaluation 10/16/23    Authorization Type [employer]    Authorization Time Period 08/21/23-10/16/23    Progress Note Due on Visit 10    PT Start Time 1100    PT Stop Time 1140    PT Time Calculation (min) 40 min    Activity Tolerance Patient tolerated treatment well;No increased pain    Behavior During Therapy [facility] for tasks assessed/performed                 Past Medical History:  Diagnosis Date   Low back pain 05/07/2017   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   Past Surgical History:  Procedure Laterality Date   BUNIONECTOMY Right 04/21/2022   Patient Active Problem List   Diagnosis Date Noted   Other osteonecrosis, 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   Elevated rheumatoid factor 04/16/2023   Sprain of sacroiliac region 02/20/2022   Generalized anxiety disorder 05/24/2020   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   Bunion of right foot 07/31/2017   Pain in left foot 07/31/2017   Radial styloid tenosynovitis (de quervain) 06/15/2017   Pain in right hip 06/13/2017   Low back pain 05/07/2017   Neck pain 05/07/2017   Pain in thoracic spine 05/07/2017   Vitamin D deficiency 11/03/2014    PCP: Sallyanne Kuster NP   REFERRING PROVIDER: Ernestene Kiel DPM  REFERRING DIAG: Plantar Fasciitis- Left   Rationale for Evaluation and Treatment: Rehabilitation  THERAPY DIAG:  Pain in left foot  Difficulty in walking, not elsewhere classified  ONSET DATE: End of Spring 2024 upon resuming working out at gym   SUBJECTIVE:                                                                                                                                                                                            SUBJECTIVE STATEMENT: Pt doing well, has new shoes for walking and new running shoes too.    PERTINENT HISTORY:  Jaclyn Gray is a 42yoF who is referred to OPPT from podiatry after ongoing left foot pain. Pt reports pain at the plantar surface of the left foot ~1 inch proximal the 1st and 2nd MTPJ about  1-2 inches in length, 8/10 at worst and 3/10 at best. Pt denies morning pain, denies any interference with 3x/week running ~  1 mile on treadmill. Pt reports normal tolerance to barefoot/ ad lib crocs in home. Pt works FT for [employer], wears combat boots while at work, typically seated for most job duties, but has had elevated pain during [employer]. Pt says pain can be worse with sitting activity, but she noticed pain upon resuming gym workouts a few months ago which heightened during a ruck. Pt is recently postpartum, but denies any peripartum foot issues. Pt denies prior history of PF or foot pain. Pt denies heel pain. Pt is s/p Rt sided bunionectomy with chronic pain. Pt was recently told she has Rheumatoid Arthritis and that this is playing a role in her Rt bunion issues. Pt is being concurrently seen for PT in [facility] for neck pain from an unrelated injury.   PAIN:  Are you having pain? Yes; Left foot plantar surface between 1st and 2nd rays - rates at 4/10- pain some at rest and worse with weightbearing.    PRECAUTIONS: None  WEIGHT BEARING RESTRICTIONS: No  FALLS:  Has soldier fallen in last 6 months? 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[employer], desk job seated most of day   PLOF: physically active, runs 2-3x weekly, rucks on occasion, recent postpartum weight gain upon cessation of nursing then began working out at gym again to lose weight   PATIENT GOALS: eliminate the target (foot pain left)   NEXT MD VISIT: PRN  OBJECTIVE:     TODAY'S TREATMENT 09/20/23 -heel hand off steps stretch 3x60 sec  -quadruped toess extension stretch  3x30secH  -standing bilat toe extension 15x5sec -seated towel scrunches 10x5secH  -standing toe extension isometric with 2 to 1 foot transition  -double heel raises, single heel raises    PATIENT EDUCATION:  Education details:  Plan for upgrading HEP and moving forward toward DC    HOME EXERCISE PROGRAM:      ASSESSMENT:  CLINICAL IMPRESSION:   Pt continued to progress in activity tolerance and pattenr of foot pain. HEP upgraded today to relfect improvements. Pt would like to DC after next 2 visits. Pt will benefit from physical therapy to address these impairments and restore activity tolerance for resumptions of unrestricted work, leisure, fitness, and parenting duties.   OBJECTIVE IMPAIRMENTS: Abnormal gait, decreased activity tolerance, decreased balance, difficulty walking, and decreased ROM.   ACTIVITY LIMITATIONS: lifting, sitting, squatting, and locomotion level  PARTICIPATION LIMITATIONS: cleaning, personal finances, interpersonal relationship, community activity, occupation, and yard work  PERSONAL FACTORS: Age, Behavior pattern, Education, Fitness, Past/current experiences, Profession, Sex, Social background, and Time since onset of injury/illness/exacerbation are also affecting patient's functional outcome.   REHAB POTENTIAL: Excellent  CLINICAL DECISION MAKING: Stable/uncomplicated  EVALUATION COMPLEXITY: High   GOALS: Goals reviewed with patient? No  SHORT TERM GOALS: Target date: 09/18/23  Pt to report consistent pain fluctuations with activity with out any exacerbation >5/10 pain in most recent 7 days.  Baseline: 8/10 worst pain  Goal status: INITIAL  2.  Pt to report consistent performance of HEP with subjective reduction in pain and/or improvement in foot mobility.  Baseline: No HEP given  Goal status: INITIAL  LONG TERM GOALS: Target date: 10/16/23  Pt to report pain free status in left foot (<3/10) for >3 days/week.  Baseline: pain every day    Goal status: INITIAL  2.  FOTO score improvement >12 points.  Baseline: pending Goal status: INITIAL  PLAN:  PT FREQUENCY: 2x/week  PT DURATION: 8 weeks  PLANNED INTERVENTIONS: Therapeutic exercises, Therapeutic activity,  Neuromuscular re-education, Balance training, Gait training, Patient/Family education, Self Care, Joint mobilization, Stair training, Orthotic/Fit training, DME instructions, and Dry Needling.  PLAN FOR NEXT SESSION:  Reassessm ROM     Peniel Hass C, PT 09/20/2023, 11:10 AM   11:10 AM, 09/20/23 Physical Therapist - [facility]  Outpatient Physical Therapy- Main Campus [phone]

## 2023-09-25 ENCOUNTER — Ambulatory Visit: Attending: Podiatry

## 2023-09-25 ENCOUNTER — Telehealth: Payer: Self-pay

## 2023-09-25 NOTE — Telephone Encounter (Signed)
Patient no showed appointment. Called and given time/date of next appointment date.    Precious Bard, PT, DPT Physical Therapist - [facility]  Outpatient Physical Therapy- Main Campus [phone]

## 2023-09-27 ENCOUNTER — Encounter

## 2023-09-27 ENCOUNTER — Other Ambulatory Visit: Payer: Self-pay | Admitting: Gastroenterology

## 2023-09-27 NOTE — Telephone Encounter (Signed)
Last office visit 004/24/2024  Last refill 08/18/23 0 refills

## 2023-10-02 ENCOUNTER — Ambulatory Visit (INDEPENDENT_AMBULATORY_CARE_PROVIDER_SITE_OTHER): Payer: Self-pay | Admitting: Nurse Practitioner

## 2023-10-02 ENCOUNTER — Encounter: Payer: Self-pay | Admitting: Nurse Practitioner

## 2023-10-02 ENCOUNTER — Ambulatory Visit
Admission: RE | Admit: 2023-10-02 | Discharge: 2023-10-02 | Disposition: A | Discharge status: Home / Self Care | Source: Ambulatory Visit | Attending: Nurse Practitioner | Admitting: Nurse Practitioner

## 2023-10-02 VITALS — BP 116/76 | HR 88 | Temp 98.2°F | Resp 16 | Ht 61.0 in | Wt 162.6 lb

## 2023-10-02 DIAGNOSIS — M5441 Lumbago with sciatica, right side: Secondary | ICD-10-CM | POA: Diagnosis not present

## 2023-10-02 DIAGNOSIS — F411 Generalized anxiety disorder: Secondary | ICD-10-CM

## 2023-10-02 DIAGNOSIS — M25551 Pain in right hip: Secondary | ICD-10-CM

## 2023-10-02 DIAGNOSIS — Z23 Encounter for immunization: Secondary | ICD-10-CM

## 2023-10-02 MED ORDER — IBUPROFEN 600 MG PO TABS: 600.0000 mg | ORAL_TABLET | Freq: Four times a day (QID) | ORAL | 0 refills | Status: DC | PRN

## 2023-10-02 MED ORDER — CYCLOBENZAPRINE HCL 5 MG PO TABS
5.0000 mg | ORAL_TABLET | Freq: Three times a day (TID) | ORAL | 0 refills | Status: DC | PRN
Start: 2023-10-02 — End: 2023-12-21

## 2023-10-02 NOTE — Progress Notes (Signed)
[facility] [address] [place], [place] [address]  Internal MEDICINE  Office Visit Note  Patient Name: Jaclyn Gray  [id number]  [id number]  Date of Service: 10/03/2023  Chief Complaint  Patient presents with   Acute Visit    Back pain x 2 weeks.      HPI Jaclyn Gray presents for an acute sick visit for back pain --onset of back pain was 11 days ago.  Low back pain, tender to touch Sitting, bending over, laying down hurts, decreased ROM, difficult to get in and out of car due to pain.   --Requesting new psychiatry referral for provider closer to home for continued care for anxiety.        Current Medication:  Outpatient Encounter Medications as of 10/02/2023  Medication Sig   acetaminophen (TYLENOL) 500 MG tablet Take 2 tablets (1,000 mg total) by mouth every 6 (six) hours as needed.   amitriptyline (ELAVIL) 25 MG tablet TAKE 1 TABLET(25 MG) BY MOUTH AT BEDTIME   benzonatate (TESSALON) 200 MG capsule Take 1 capsule by mouth every 8 (eight) hours for cough.   folic acid (FOLVITE) 1 MG tablet Take 1 tablet (1 mg total) by mouth daily.   naproxen (NAPROSYN) 375 MG tablet Take 1 tablet (375 mg total) by mouth 2 (two) times daily with a meal.   nitroGLYCERIN (NITROSTAT) 0.4 MG SL tablet Place 1 tablet (0.4 mg total) under the tongue every 5 (five) minutes as needed for chest pain. X3 doses. If no relief after 3rd dose, call 911 or go to ER.   Prenatal Vit-Fe Fumarate-FA (MULTIVITAMIN-PRENATAL) 27-0.8 MG TABS tablet Take 1 tablet by mouth daily at 12 noon.   promethazine (PHENERGAN) 12.5 MG tablet Take 1 tablet (12.5 mg total) by mouth every 8 (eight) hours as needed for nausea or vomiting.   triamcinolone cream (KENALOG) 0.1 % Apply 1 Application topically 2 (two) times daily. To affected area   venlafaxine (EFFEXOR) 37.5 MG tablet Take 37.5 mg by mouth 2 (two) times daily.   [DISCONTINUED] cyclobenzaprine (FLEXERIL) 5 MG tablet Take 1 tablet (5 mg total) by  mouth 3 (three) times daily as needed for muscle spasms.   [DISCONTINUED] ibuprofen (ADVIL) 600 MG tablet Take 1 tablet (600 mg total) by mouth every 6 (six) hours as needed.   cyclobenzaprine (FLEXERIL) 5 MG tablet Take 1 tablet (5 mg total) by mouth 3 (three) times daily as needed for muscle spasms.   ibuprofen (ADVIL) 600 MG tablet Take 1 tablet (600 mg total) by mouth every 6 (six) hours as needed.   No facility-administered encounter medications on file as of 10/02/2023.      Medical History: Past Medical History:  Diagnosis Date   ASCUS with positive high risk HPV cervical on 07/21/2020 07/29/2020   At risk for fertility problems 08/02/2019   Generalized anxiety disorder 05/24/2020   Gestational hypertension 02/10/2021   Low back pain 05/07/2017   Uterine leiomyoma 10/26/2020   At anatomy u/s 09/15/20:  Myomas  Site                     L(cm)      W(cm)      D(cm)       Location  Posterior                5.7        4.7        5.4         Intramural  Posterior  3.5        3.7        4           Intramural  Posterior                3.3        2.6        3.2         Intramural   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     Vital Signs: BP 116/76   Pulse 88   Temp 98.2 F (36.8 C)   Resp 16   Ht 5\' 1"  (1.549 m)   Wt 162 lb 9.6 oz (73.8 kg)   SpO2 96%   BMI 30.72 kg/m    Review of Systems  HENT: Negative.    Respiratory: Negative.  Negative for cough, chest tightness, shortness of breath and wheezing.   Cardiovascular: Negative.  Negative for chest pain and palpitations.  Musculoskeletal:  Positive for arthralgias, back pain and myalgias.    Physical Exam Vitals reviewed.  Constitutional:      General: She is not in acute distress.    Appearance: Normal appearance. She is not ill-appearing.  HENT:     Head: Normocephalic and atraumatic.  Eyes:     Pupils: Pupils are equal, round, and reactive to light.  Cardiovascular:     Rate and  Rhythm: Normal rate and regular rhythm.  Pulmonary:     Effort: Pulmonary effort is normal. No respiratory distress.  Musculoskeletal:     Lumbar back: Spasms and tenderness present. Decreased range of motion. Positive right straight leg raise test.  Neurological:     Mental Status: She is alert and oriented to person, place, and time.  Psychiatric:        Mood and Affect: Mood normal.        Behavior: Behavior normal.       Assessment/Plan: 1. Acute right-sided low back pain with right-sided sciatica Xrays ordered for further evaluation. Also recommend as needed use of cyclobenzaprine and ibuprofen, icing the affected area and rest. Pending xray results, consider orthopedic referral - DG Lumbar Spine Complete; Future - DG Hip Unilat W OR W/O Pelvis 2-3 Views Right; Future - cyclobenzaprine (FLEXERIL) 5 MG tablet; Take 1 tablet (5 mg total) by mouth 3 (three) times daily as needed for muscle spasms.  Dispense: 60 tablet; Refill: 0 - ibuprofen (ADVIL) 600 MG tablet; Take 1 tablet (600 mg total) by mouth every 6 (six) hours as needed.  Dispense: 90 tablet; Refill: 0  2. Acute right hip pain Pending xray results, consider orthopedic referral - DG Lumbar Spine Complete; Future - DG Hip Unilat W OR W/O Pelvis 2-3 Views Right; Future - cyclobenzaprine (FLEXERIL) 5 MG tablet; Take 1 tablet (5 mg total) by mouth 3 (three) times daily as needed for muscle spasms.  Dispense: 60 tablet; Refill: 0 - ibuprofen (ADVIL) 600 MG tablet; Take 1 tablet (600 mg total) by mouth every 6 (six) hours as needed.  Dispense: 90 tablet; Refill: 0  3. Generalized anxiety disorder Referred to psychiatry for continuing issues with anxiety.  - Ambulatory referral to Psychiatry  4. Needs flu shot Flu vaccine administered in office today.  - Influenza, MDCK, trivalent, PF(Flucelvax egg-free)   General Counseling: Jaclyn Gray verbalizes understanding of the findings of todays visit and agrees with plan of  treatment. I have discussed any further diagnostic evaluation that may be needed or ordered today. We also reviewed her  medications today. she has been encouraged to call the office with any questions or concerns that should arise related to todays visit.    Counseling:    Orders Placed This Encounter  Procedures   DG Lumbar Spine Complete   DG Hip Unilat W OR W/O Pelvis 2-3 Views Right   Influenza, MDCK, trivalent, PF(Flucelvax egg-free)   Ambulatory referral to Psychiatry    Meds ordered this encounter  Medications   cyclobenzaprine (FLEXERIL) 5 MG tablet    Sig: Take 1 tablet (5 mg total) by mouth 3 (three) times daily as needed for muscle spasms.    Dispense:  60 tablet    Refill:  0   ibuprofen (ADVIL) 600 MG tablet    Sig: Take 1 tablet (600 mg total) by mouth every 6 (six) hours as needed.    Dispense:  90 tablet    Refill:  0    Return if symptoms worsen or fail to improve.  [place] Controlled Substance Database was reviewed by me for overdose risk score (ORS)  Time spent:30 Minutes Time spent with patient included reviewing progress notes, labs, imaging studies, and discussing plan for follow up.   This patient was seen by Sallyanne Kuster, FNP-C in collaboration with Dr. Beverely Risen as a part of collaborative care agreement.  Bjorn Hallas R. Tedd Sias, MSN, FNP-C Internal Medicine

## 2023-10-03 ENCOUNTER — Telehealth: Payer: Self-pay | Admitting: Nurse Practitioner

## 2023-10-03 ENCOUNTER — Encounter: Payer: Self-pay | Admitting: Nurse Practitioner

## 2023-10-03 NOTE — Telephone Encounter (Signed)
Notified patient that Medical Readiness Command Profile Report and MR is ready to be p/u at front desk-Toni

## 2023-10-04 ENCOUNTER — Encounter

## 2023-10-05 ENCOUNTER — Telehealth: Payer: Self-pay | Admitting: Nurse Practitioner

## 2023-10-05 ENCOUNTER — Telehealth: Payer: Self-pay

## 2023-10-05 NOTE — Telephone Encounter (Signed)
Pt called that she still having back pain advised her to gave 1 or 2 days for med to work  if she not feeling better she can go to urgent care

## 2023-10-05 NOTE — Telephone Encounter (Signed)
BH referral faxed to [facility] per [employer] approval; [phone]. Notified patient. Gave pt telephone # [phone]

## 2023-10-16 ENCOUNTER — Ambulatory Visit

## 2023-10-18 ENCOUNTER — Ambulatory Visit: Admitting: Physical Therapy

## 2023-10-18 ENCOUNTER — Encounter

## 2023-10-18 ENCOUNTER — Telehealth: Payer: Self-pay | Admitting: Physical Therapy

## 2023-10-18 NOTE — Telephone Encounter (Signed)
Attempted to reach patient via phone to notify of missed PT treatment. Sent to voicemail. Voicemail box full and unable to leave message. Pt will need to call clinic to schedule any additional PT treatments.   Grier Rocher PT, DPT  Physical Therapist - [facility]  11:38 AM 10/18/23

## 2023-10-23 ENCOUNTER — Encounter

## 2023-10-25 ENCOUNTER — Telehealth: Payer: Self-pay

## 2023-10-25 ENCOUNTER — Encounter

## 2023-10-25 NOTE — Telephone Encounter (Signed)
Pt advised that both X-ray is normal as per alyssa advised we can send referral for orthopedic or physical medicine find out by insurance and call toni back

## 2023-10-30 ENCOUNTER — Encounter

## 2023-11-01 ENCOUNTER — Encounter

## 2023-11-06 ENCOUNTER — Encounter

## 2023-11-08 ENCOUNTER — Encounter

## 2023-11-13 ENCOUNTER — Encounter

## 2023-11-15 ENCOUNTER — Encounter

## 2023-11-21 ENCOUNTER — Telehealth: Payer: Self-pay

## 2023-11-21 NOTE — Telephone Encounter (Signed)
Patient called requesting a mycotoxin urine test order be sent to [facility]. AA will order.

## 2023-12-03 ENCOUNTER — Emergency Department (HOSPITAL_COMMUNITY)
Admission: EM | Admit: 2023-12-03 | Discharge: 2023-12-03 | Disposition: A | Discharge status: Home / Self Care | Attending: Emergency Medicine | Admitting: Emergency Medicine

## 2023-12-03 ENCOUNTER — Emergency Department (HOSPITAL_COMMUNITY)

## 2023-12-03 ENCOUNTER — Encounter (HOSPITAL_COMMUNITY): Payer: Self-pay

## 2023-12-03 ENCOUNTER — Other Ambulatory Visit: Payer: Self-pay

## 2023-12-03 DIAGNOSIS — R519 Headache, unspecified: Secondary | ICD-10-CM | POA: Insufficient documentation

## 2023-12-03 DIAGNOSIS — R110 Nausea: Secondary | ICD-10-CM | POA: Diagnosis not present

## 2023-12-03 HISTORY — DX: Migraine, unspecified, not intractable, without status migrainosus: G43.909

## 2023-12-03 LAB — BASIC METABOLIC PANEL
Anion gap: 9 (ref 5–15)
BUN: 10 mg/dL (ref 6–20)
CO2: 21 mmol/L — ABNORMAL LOW (ref 22–32)
Calcium: 9.1 mg/dL (ref 8.9–10.3)
Chloride: 107 mmol/L (ref 98–111)
Creatinine, Ser: 1.01 mg/dL — ABNORMAL HIGH (ref 0.44–1.00)
GFR, Estimated: >60 mL/min (ref >60)
Glucose, Bld: 90 mg/dL (ref 70–99)
Potassium: 3.9 mmol/L (ref 3.5–5.1)
Sodium: 137 mmol/L (ref 135–145)

## 2023-12-03 LAB — CBC
HCT: 41.8 % (ref 36.0–46.0)
Hemoglobin: 14.2 g/dL (ref 12.0–15.0)
MCH: 32.1 pg (ref 26.0–34.0)
MCHC: 34 g/dL (ref 30.0–36.0)
MCV: 94.6 fL (ref 80.0–100.0)
Platelets: 274 10*3/uL (ref 150–400)
RBC: 4.42 MIL/uL (ref 3.87–5.11)
RDW: 11.9 % (ref 11.5–15.5)
WBC: 5 10*3/uL (ref 4.0–10.5)
nRBC: 0 % (ref 0.0–0.2)

## 2023-12-03 LAB — CBG MONITORING, ED: Glucose-Capillary: 84 mg/dL (ref 70–99)

## 2023-12-03 LAB — URINALYSIS, ROUTINE W REFLEX MICROSCOPIC
Bilirubin Urine: NEGATIVE
Glucose, UA: NEGATIVE mg/dL
Hgb urine dipstick: NEGATIVE
Ketones, ur: NEGATIVE mg/dL
Nitrite: NEGATIVE
Protein, ur: NEGATIVE mg/dL
Specific Gravity, Urine: 1.019 (ref 1.005–1.030)
pH: 6 (ref 5.0–8.0)

## 2023-12-03 LAB — HCG, SERUM, QUALITATIVE: Preg, Serum: NEGATIVE

## 2023-12-03 MED ORDER — DIPHENHYDRAMINE HCL 50 MG/ML IJ SOLN
25.0000 mg | Freq: Once | INTRAMUSCULAR | Status: AC
  Administered 2023-12-03: 25 mg via INTRAVENOUS
  Filled 2023-12-03: qty 1

## 2023-12-03 MED ORDER — METOCLOPRAMIDE HCL 5 MG/ML IJ SOLN
10.0000 mg | Freq: Once | INTRAMUSCULAR | Status: AC
  Administered 2023-12-03: 10 mg via INTRAVENOUS
  Filled 2023-12-03: qty 2

## 2023-12-03 MED ORDER — SODIUM CHLORIDE 0.9 % IV BOLUS
500.0000 mL | Freq: Once | INTRAVENOUS | Status: AC
  Administered 2023-12-03: 500 mL via INTRAVENOUS

## 2023-12-03 MED ORDER — KETOROLAC TROMETHAMINE 15 MG/ML IJ SOLN
15.0000 mg | Freq: Once | INTRAMUSCULAR | Status: AC
  Administered 2023-12-03: 15 mg via INTRAVENOUS
  Filled 2023-12-03: qty 1

## 2023-12-03 NOTE — ED Triage Notes (Signed)
Pt states she has had dizziness, felt like she was on a boat, her vision looks like a static screen, has had ringing in ears, headache and neck pain that started 2 days ago. Pt states symptoms improved and now just has headache and blurred vision which she has normally gets with her migraines.

## 2023-12-03 NOTE — Discharge Instructions (Addendum)
You were seen here today for a headache.  Your workup showed no injury to your head or bleeding on your brain on your CT scan.  Your electrolytes, kidney function, and cell counts are all reassuring today.  Your urinalysis, or urine test, had some bacteria on it, but in the absence of symptoms I do not think this is representative of a bladder infection..  Please use all medications as previously prescribed.  Please follow-up with your PCP and your neurologist.  Please return to the emergency department for chest pain, shortness of breath, severe vomiting or inability to keep down food, loss of consciousness, or any worsening symptom or concern.

## 2023-12-03 NOTE — ED Provider Notes (Signed)
[place] EMERGENCY DEPARTMENT AT [facility] Provider Note   CSN: [id number] Arrival date & time: 12/03/23  4098     History  Chief Complaint  Patient presents with   Headache    Jaclyn Gray is a 42 y.o. female with a PMH of migraines who presented to the ED for headache.  Patient reports for the past 2 days, she has had a headache in the back of her head and neck with associated photophobia and phonophobia.  Reports this feels like her typical migraine.  Reports that on Saturday, she had an episode of dizziness and feeling like she was swaying on a boat while laying down with blurry vision and very loud ringing in her ears, which is not normal for her.  Reports she chronically has tinnitus, however it has been louder during this episode.  Reports that the episode self resolved on Sunday, but that she still has the headache and blurry vision.  She reports that she is supposed to wear glasses but does not, feels that her vision is probably worse than it usually is.  She endorses nausea without vomiting.  She denies fever, cough, congestion.  Otherwise denies chest pain, abdominal pain.  Reports she called her neurology office this morning, who told her to come to the ED for evaluation.   Headache      Home Medications Prior to Admission medications   Medication Sig Start Date End Date Taking? Authorizing Provider  EMGALITY 120 MG/ML SOAJ Inject 1 mL into the skin every 30 (thirty) days.   Yes [provider]  nitroGLYCERIN (NITROSTAT) 0.4 MG SL tablet Place 1 tablet (0.4 mg total) under the tongue every 5 (five) minutes as needed for chest pain. X3 doses. If no relief after 3rd dose, call 911 or go to ER. 04/02/23  Yes Abernathy, Alyssa, NP  acetaminophen (TYLENOL) 500 MG tablet Take 2 tablets (1,000 mg total) by mouth every 6 (six) hours as needed. Patient not taking: Reported on 12/03/2023 06/16/22   Carlisle Beers, FNP  amitriptyline (ELAVIL) 25 MG  tablet TAKE 1 TABLET(25 MG) BY MOUTH AT BEDTIME Patient not taking: Reported on 12/03/2023 09/27/23   Toney Reil, MD  benzonatate (TESSALON) 200 MG capsule Take 1 capsule by mouth every 8 (eight) hours for cough. Patient not taking: Reported on 12/03/2023 06/27/23   Mardella Layman, MD  cyclobenzaprine (FLEXERIL) 5 MG tablet Take 1 tablet (5 mg total) by mouth 3 (three) times daily as needed for muscle spasms. Patient not taking: Reported on 12/03/2023 10/02/23   Sallyanne Kuster, NP  folic acid (FOLVITE) 1 MG tablet Take 1 tablet (1 mg total) by mouth daily. Patient not taking: Reported on 12/03/2023 07/05/23   Sallyanne Kuster, NP  ibuprofen (ADVIL) 600 MG tablet Take 1 tablet (600 mg total) by mouth every 6 (six) hours as needed. Patient not taking: Reported on 12/03/2023 10/02/23   Sallyanne Kuster, NP  naproxen (NAPROSYN) 375 MG tablet Take 1 tablet (375 mg total) by mouth 2 (two) times daily with a meal. Patient not taking: Reported on 12/03/2023 03/30/23   Wallis Bamberg, PA-C  Prenatal Vit-Fe Fumarate-FA (MULTIVITAMIN-PRENATAL) 27-0.8 MG TABS tablet Take 1 tablet by mouth daily at 12 noon. Patient not taking: Reported on 12/03/2023 01/24/21   Anyanwu, Jethro Bastos, MD  promethazine (PHENERGAN) 12.5 MG tablet Take 1 tablet (12.5 mg total) by mouth every 8 (eight) hours as needed for nausea or vomiting. Patient not taking: Reported on 12/03/2023 11/06/22   Abernathy,  Alyssa, NP  triamcinolone cream (KENALOG) 0.1 % Apply 1 Application topically 2 (two) times daily. To affected area Patient not taking: Reported on 12/03/2023 09/27/22   Sallyanne Kuster, NP      Allergies    Patient has no known allergies.    Review of Systems   Review of Systems  Neurological:  Positive for headaches.    Physical Exam Updated Vital Signs BP 126/85   Pulse 81   Temp 98.2 F (36.8 C) (Oral)   Resp 15   Ht 5\' 1"  (1.549 m)   Wt 72.1 kg   LMP 11/26/2023   SpO2 99%   BMI 30.04 kg/m  Physical  Exam Constitutional:      General: She is not in acute distress.    Appearance: She is well-developed. She is not ill-appearing.  HENT:     Head: Normocephalic and atraumatic.     Mouth/Throat:     Mouth: Mucous membranes are moist.     Pharynx: Oropharynx is clear.  Eyes:     Extraocular Movements: Extraocular movements intact.     Right eye: No nystagmus.     Left eye: No nystagmus.     Pupils: Pupils are equal, round, and reactive to light.     Comments: Visual acuity 20/20 in bilateral eyes  Cardiovascular:     Rate and Rhythm: Normal rate and regular rhythm.     Heart sounds: Normal heart sounds. No murmur heard.    No friction rub. No gallop.  Pulmonary:     Breath sounds: Normal breath sounds. No stridor. No wheezing, rhonchi or rales.  Abdominal:     General: There is no distension.     Palpations: Abdomen is soft.     Tenderness: There is no abdominal tenderness. There is no guarding.  Musculoskeletal:        General: Normal range of motion.     Cervical back: Normal range of motion and neck supple.  Skin:    General: Skin is warm and dry.  Neurological:     Mental Status: She is alert.     Comments: Cranial nerves II-XII intact.  Strength 5/5 and sensation intact in all 4 extremities.  Finger-to-nose with no dysmetria.  Gait stable without ataxia     ED Results / Procedures / Treatments   Labs (all labs ordered are listed, but only abnormal results are displayed) Labs Reviewed  BASIC METABOLIC PANEL - Abnormal; Notable for the following components:      Result Value   CO2 21 (*)    Creatinine, Ser 1.01 (*)    All other components within normal limits  URINALYSIS, ROUTINE W REFLEX MICROSCOPIC - Abnormal; Notable for the following components:   APPearance HAZY (*)    Leukocytes,Ua MODERATE (*)    Bacteria, UA FEW (*)    All other components within normal limits  CBC  HCG, SERUM, QUALITATIVE  CBG MONITORING, ED    EKG EKG  Interpretation Date/Time:  Monday December 03 2023 09:51:34 EST Ventricular Rate:  78 PR Interval:  136 QRS Duration:  70 QT Interval:  364 QTC Calculation: 414 R Axis:   44  Text Interpretation: Normal sinus rhythm Normal ECG When compared with ECG of 20-Jun-2023 09:22, PREVIOUS ECG IS PRESENT Confirmed by Blane Ohara [phone]) on 12/03/2023 11:37:04 AM  Radiology CT Head Wo Contrast  Result Date: 12/03/2023 CLINICAL DATA:  Headache, increasing frequency or severity. EXAM: CT HEAD WITHOUT CONTRAST TECHNIQUE: Contiguous axial images were obtained from the  base of the skull through the vertex without intravenous contrast. RADIATION DOSE REDUCTION: This exam was performed according to the departmental dose-optimization program which includes automated exposure control, adjustment of the mA and/or kV according to patient size and/or use of iterative reconstruction technique. COMPARISON:  MRI brain 06/21/2023. FINDINGS: Brain: No acute intracranial hemorrhage. Gray-white differentiation is preserved. No hydrocephalus or extra-axial collection. No mass effect or midline shift. Vascular: No hyperdense vessel or unexpected calcification. Skull: No calvarial fracture or suspicious bone lesion. Skull base is unremarkable. Sinuses/Orbits: No acute finding. Other: None. IMPRESSION: No acute intracranial abnormality. Electronically Signed   By: Orvan Falconer M.D.   On: 12/03/2023 12:10    Procedures Procedures    Medications Ordered in ED Medications  sodium chloride 0.9 % bolus 500 mL (0 mLs Intravenous Stopped 12/03/23 1350)  metoCLOPramide (REGLAN) injection 10 mg (10 mg Intravenous Given 12/03/23 1217)  diphenhydrAMINE (BENADRYL) injection 25 mg (25 mg Intravenous Given 12/03/23 1217)  ketorolac (TORADOL) 15 MG/ML injection 15 mg (15 mg Intravenous Given 12/03/23 1218)    ED Course/ Medical Decision Making/ A&P                                 Medical Decision Making Amount and/or Complexity of  Data Reviewed Labs: ordered. Radiology: ordered.  Risk Prescription drug management.   Vital signs stable, physical exam including neurologic exam reassuring with 20/20 visual acuity bilaterally.  CBC with no leukocytosis or anemia.  Metabolic panel with no gross metabolic or electrolyte abnormality.  UA with moderate leukocyte esterase, few bacteria, in the absence of symptoms, I do not feel this reflects UTI.  CT head was obtained, which showed no acute intracranial abnormality.  I have low suspicion for posterior circulation pathology including stroke or PRES syndrome at this time, visual acuity is intact and patient is not currently experiencing dizziness, nystagmus, or gait abnormality.  The headache overall sounds consistent with her usual migraines, although she did have an episode of atypical symptoms for her over the weekend that has resolved yesterday.  She reports significant improvement of her headache after migraine cocktail.  Results of laboratory and imaging evaluations were discussed with the patient.  Discussed using all medications as prescribed.  Encouraged patient to follow-up with her PCP and neurologist.  Strict return precautions were discussed, and the patient voiced understanding.  Patient was discharged in stable condition.        Final Clinical Impression(s) / ED Diagnoses Final diagnoses:  Headache disorder    Rx / DC Orders ED Discharge Orders     None         Janyth Pupa, MD 12/03/23 1638    Blane Ohara, MD 12/04/23 [id number]

## 2023-12-05 ENCOUNTER — Ambulatory Visit (INDEPENDENT_AMBULATORY_CARE_PROVIDER_SITE_OTHER): Admitting: Nurse Practitioner

## 2023-12-05 ENCOUNTER — Encounter: Payer: Self-pay | Admitting: Nurse Practitioner

## 2023-12-05 VITALS — BP 110/86 | HR 80 | Temp 98.4°F | Resp 16 | Ht 61.0 in | Wt 163.6 lb

## 2023-12-05 DIAGNOSIS — R42 Dizziness and giddiness: Secondary | ICD-10-CM | POA: Diagnosis not present

## 2023-12-05 DIAGNOSIS — R8290 Unspecified abnormal findings in urine: Secondary | ICD-10-CM | POA: Diagnosis not present

## 2023-12-05 DIAGNOSIS — E66811 Obesity, class 1: Secondary | ICD-10-CM

## 2023-12-05 DIAGNOSIS — Z6830 Body mass index (BMI) 30.0-30.9, adult: Secondary | ICD-10-CM

## 2023-12-05 DIAGNOSIS — E6609 Other obesity due to excess calories: Secondary | ICD-10-CM

## 2023-12-05 DIAGNOSIS — R300 Dysuria: Secondary | ICD-10-CM | POA: Diagnosis not present

## 2023-12-05 MED ORDER — TIRZEPATIDE-WEIGHT MANAGEMENT 2.5 MG/0.5ML ~~LOC~~ SOAJ: 2.5000 mg | SUBCUTANEOUS | 0 refills | Status: AC

## 2023-12-05 MED ORDER — TIRZEPATIDE-WEIGHT MANAGEMENT 5 MG/0.5ML ~~LOC~~ SOAJ: 5.0000 mg | SUBCUTANEOUS | 1 refills | Status: DC

## 2023-12-05 NOTE — Progress Notes (Signed)
[facility] [address] [place], [place] [address]  Internal MEDICINE  Office Visit Note  Patient Name: Jaclyn Gray  [id number]  [id number]  Date of Service: 12/05/2023  Chief Complaint  Patient presents with   Follow-up    ED f/u     HPI Jaclyn Gray presents for a follow-up visit for recent ED visit for vertigo.  Abnormal urinalysis -- leukocytes noted on urinalysis in the ED but the urine was not cultured. Patient is also requesting for testing for vaginal infections including yeast, STDs and BV.  Obesity -- has tried fasting, different diets, goes to the gym daily. Had adverse side effects with phentermine including tachycardia, palpitations and worsening anxiety.  Vertigo/dizziness --- resolved.    Current Medication: Outpatient Encounter Medications as of 12/05/2023  Medication Sig Note   acetaminophen (TYLENOL) 500 MG tablet Take 2 tablets (1,000 mg total) by mouth every 6 (six) hours as needed.    amitriptyline (ELAVIL) 25 MG tablet TAKE 1 TABLET(25 MG) BY MOUTH AT BEDTIME    benzonatate (TESSALON) 200 MG capsule Take 1 capsule by mouth every 8 (eight) hours for cough.    cyclobenzaprine (FLEXERIL) 5 MG tablet Take 1 tablet (5 mg total) by mouth 3 (three) times daily as needed for muscle spasms.    EMGALITY 120 MG/ML SOAJ Inject 1 mL into the skin every 30 (thirty) days. 12/03/2023: Hasn't started taking   folic acid (FOLVITE) 1 MG tablet Take 1 tablet (1 mg total) by mouth daily.    ibuprofen (ADVIL) 600 MG tablet Take 1 tablet (600 mg total) by mouth every 6 (six) hours as needed.    naproxen (NAPROSYN) 375 MG tablet Take 1 tablet (375 mg total) by mouth 2 (two) times daily with a meal.    nitroGLYCERIN (NITROSTAT) 0.4 MG SL tablet Place 1 tablet (0.4 mg total) under the tongue every 5 (five) minutes as needed for chest pain. X3 doses. If no relief after 3rd dose, call 911 or go to ER.    Prenatal Vit-Fe Fumarate-FA (MULTIVITAMIN-PRENATAL) 27-0.8 MG TABS  tablet Take 1 tablet by mouth daily at 12 noon.    promethazine (PHENERGAN) 12.5 MG tablet Take 1 tablet (12.5 mg total) by mouth every 8 (eight) hours as needed for nausea or vomiting.    tirzepatide (ZEPBOUND) 2.5 MG/0.5ML Pen Inject 2.5 mg into the skin once a week for 4 doses.    [START ON 12/26/2023] tirzepatide (ZEPBOUND) 5 MG/0.5ML Pen Inject 5 mg into the skin once a week.    triamcinolone cream (KENALOG) 0.1 % Apply 1 Application topically 2 (two) times daily. To affected area    No facility-administered encounter medications on file as of 12/05/2023.    Surgical History: Past Surgical History:  Procedure Laterality Date   BUNIONECTOMY Right 04/21/2022   COLONOSCOPY WITH PROPOFOL N/A 06/06/2023   Procedure: COLONOSCOPY WITH PROPOFOL;  Surgeon: Toney Reil, MD;  Location: [facility] ENDOSCOPY;  Service: Gastroenterology;  Laterality: N/A;   WISDOM TOOTH EXTRACTION     x4 extracted     Medical History: Past Medical History:  Diagnosis Date   ASCUS with positive high risk HPV cervical on 07/21/2020 07/29/2020   At risk for fertility problems 08/02/2019   Generalized anxiety disorder 05/24/2020   Low back pain 05/07/2017   Migraine    Uterine leiomyoma 10/26/2020   At anatomy u/s 09/15/20:  Myomas  Site  L(cm)      W(cm)      D(cm)       Location  Posterior                5.7        4.7        5.4         Intramural  Posterior                3.5        3.7        4           Intramural  Posterior                3.3        2.6        3.2         Intramural   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    Family History: Family History  Problem Relation Age of Onset   Hypertension Mother    Breast cancer Mother 27   Prostate cancer Father    Hypertension Father    Diabetes Father    Throat cancer Maternal Grandmother    Colon cancer Maternal Grandmother    Lung cancer Maternal Grandfather     Social History   Socioeconomic  History   Marital status: Single    Spouse name: Not on file   Number of children: 1   Years of education: Not on file   Highest education level: Not on file  Occupational History   Occupation: Hotel manager  Tobacco Use   Smoking status: Never    Passive exposure: Never   Smokeless tobacco: Never  Vaping Use   Vaping status: Never Used  Substance and Sexual Activity   Alcohol use: Not Currently    Comment: social    Drug use: Never   Sexual activity: Not Currently    Birth control/protection: None  Other Topics Concern   Not on file  Social History Narrative   Not on file   Social Determinants of Health   Financial Resource Strain: Not on file  Food Insecurity: Not on file  Transportation Needs: Not on file  Physical Activity: Not on file  Stress: Not on file  Social Connections: Not on file  Intimate Partner Violence: Not on file      Review of Systems  Constitutional:  Positive for unexpected weight change.  Respiratory:  Negative for cough, chest tightness, shortness of breath and wheezing.   Cardiovascular: Negative.  Negative for chest pain and palpitations.  Genitourinary:  Positive for dysuria.  Neurological:  Positive for headaches.    Vital Signs: BP 110/86   Pulse 80   Temp 98.4 F (36.9 C)   Resp 16   Ht 5\' 1"  (1.549 m)   Wt 163 lb 9.6 oz (74.2 kg)   LMP 11/26/2023   SpO2 97%   BMI 30.91 kg/m    Physical Exam Vitals reviewed.  Constitutional:      General: She is not in acute distress.    Appearance: Normal appearance. She is obese. She is not ill-appearing.  HENT:     Head: Normocephalic and atraumatic.  Eyes:     Pupils: Pupils are equal, round, and reactive to light.  Cardiovascular:     Rate and Rhythm: Normal rate and regular rhythm.  Pulmonary:     Effort: Pulmonary effort is normal. No respiratory distress.  Neurological:  Mental Status: She is alert and oriented to person, place, and time.  Psychiatric:        Mood and  Affect: Mood normal.        Behavior: Behavior normal.        Assessment/Plan: 1. Abnormal finding on urinalysis Nuswab done as a blind swab by patient and urine culture sent. Will call patient with results and any prescriptions that are warranted.  - NuSwab Vaginitis Plus (VG+) - CULTURE, URINE COMPREHENSIVE  2. Dysuria Nuswab done as a blind swab by patient and urine culture sent  - NuSwab Vaginitis Plus (VG+) - CULTURE, URINE COMPREHENSIVE  3. Dizziness Resolved   4. Class 1 obesity due to excess calories without serious comorbidity with body mass index (BMI) of 30.0 to 30.9 in adult Did not tolerate phentermine previously due to tachycardia and palpitations. Has previously tried different diets, weight watchers and exercises daily. BMI is elevated. Discussed zepbound and wegovy, will try to get zepbound approved.  - tirzepatide (ZEPBOUND) 2.5 MG/0.5ML Pen; Inject 2.5 mg into the skin once a week for 4 doses.  Dispense: 2 mL; Refill: 0 - tirzepatide (ZEPBOUND) 5 MG/0.5ML Pen; Inject 5 mg into the skin once a week.  Dispense: 6 mL; Refill: 1   General Counseling: Jaclyn Gray verbalizes understanding of the findings of todays visit and agrees with plan of treatment. I have discussed any further diagnostic evaluation that may be needed or ordered today. We also reviewed her medications today. she has been encouraged to call the office with any questions or concerns that should arise related to todays visit.    Orders Placed This Encounter  Procedures   CULTURE, URINE COMPREHENSIVE   NuSwab Vaginitis Plus (VG+)    Meds ordered this encounter  Medications   tirzepatide (ZEPBOUND) 2.5 MG/0.5ML Pen    Sig: Inject 2.5 mg into the skin once a week for 4 doses.    Dispense:  2 mL    Refill:  0    Dx code E66.01., will increase to 5 mg dose after 4 doses of 2.5 mg   tirzepatide (ZEPBOUND) 5 MG/0.5ML Pen    Sig: Inject 5 mg into the skin once a week.    Dispense:  6 mL    Refill:   1    Dx code E66.01, to be started after first 4 doses of 2.5 mg.    Return in about 2 months (around 02/05/2024) for F/U, Weight loss, Deleon Passe PCP, eval new med.   Total time spent:30 Minutes Time spent includes review of chart, medications, test results, and follow up plan with the patient.    Controlled Substance Database was reviewed by me.  This patient was seen by Sallyanne Kuster, FNP-C in collaboration with Dr. Beverely Risen as a part of collaborative care agreement.   Capri Raben R. Tedd Sias, MSN, FNP-C Internal medicine

## 2023-12-09 LAB — NUSWAB VAGINITIS PLUS (VG+)
Chlamydia trachomatis, NAA: NEGATIVE
Neisseria gonorrhoeae, NAA: NEGATIVE
Trich vag by NAA: NEGATIVE

## 2023-12-10 LAB — CULTURE, URINE COMPREHENSIVE

## 2023-12-12 ENCOUNTER — Telehealth: Payer: Self-pay

## 2023-12-12 NOTE — Telephone Encounter (Signed)
Completed prior authorization for patient's Zepbound 2.5mg .

## 2023-12-13 ENCOUNTER — Encounter: Payer: Self-pay | Admitting: Nurse Practitioner

## 2023-12-14 ENCOUNTER — Other Ambulatory Visit: Payer: Self-pay | Admitting: Nurse Practitioner

## 2023-12-14 ENCOUNTER — Telehealth: Payer: Self-pay

## 2023-12-14 MED ORDER — BORIC ACID VAGINAL 600 MG VA SUPP: 1.0000 | Freq: Every day | VAGINAL | 0 refills | Status: DC

## 2023-12-14 NOTE — Progress Notes (Signed)
Please call patient and let her know that the urine culture was negative but her vaginal swab was positive for candida glabrata which is a strain of yeast that is resistant to fluconazole and other similar medications. I have sent in a prescription for a boric acid vaginal suppository which she should insert vaginally once daily for 14 days to treat the yeast infection. The medication is over the counter so if her insurance will not cover it, she can still get it at the pharmacy.  The rest of the test was negative for STDs and bacterial vaginosis.

## 2023-12-14 NOTE — Telephone Encounter (Signed)
-----   Message from [place] sent at 12/14/2023  8:24 AM EST ----- Please call patient and let her know that the urine culture was negative but her vaginal swab was positive for candida glabrata which is a strain of yeast that is resistant to fluconazole and other similar medications. I have sent in a prescription for a boric acid vaginal suppository which she should insert vaginally once daily for 14 days to treat the yeast infection. The medication is over the counter so if her insurance will not cover it, she can still get it at the pharmacy.  The rest of the test was negative for STDs and bacterial vaginosis.

## 2023-12-14 NOTE — Telephone Encounter (Signed)
Patient didn't answer the phone but I send patient a MyChart message with her results

## 2023-12-17 ENCOUNTER — Telehealth: Payer: Self-pay

## 2023-12-17 NOTE — Telephone Encounter (Signed)
Called [employer] to ask for verbal prior auth for Zepbound, case number is [id number]. [employer] is not willing to cover Boric Acid suppositories.

## 2023-12-20 ENCOUNTER — Other Ambulatory Visit: Payer: Self-pay

## 2023-12-20 MED ORDER — BORIC ACID VAGINAL 600 MG VA SUPP: 1.0000 | Freq: Every day | VAGINAL | 0 refills | Status: AC

## 2023-12-21 ENCOUNTER — Other Ambulatory Visit: Payer: Self-pay | Admitting: Nurse Practitioner

## 2023-12-21 ENCOUNTER — Telehealth: Payer: Self-pay

## 2023-12-21 DIAGNOSIS — M25551 Pain in right hip: Secondary | ICD-10-CM

## 2023-12-21 DIAGNOSIS — M5441 Lumbago with sciatica, right side: Secondary | ICD-10-CM

## 2023-12-21 MED ORDER — CYCLOBENZAPRINE HCL 5 MG PO TABS: 5.0000 mg | ORAL_TABLET | Freq: Three times a day (TID) | ORAL | 1 refills | Status: AC | PRN

## 2023-12-21 NOTE — Telephone Encounter (Signed)
Patient has called multiple times to ask if a prior Berkley Harvey has been completed for Zepbound. I personally called [employer] on Monday, 12/23, at 2:50pm, to complete this. I told the patient this. She then called back and placed me on a 3-way call without my permission with [employer]. The call took a very long time as I re-did the prior auth over the phone with patient present. Patient has called a minimum of 2x daily over the last week and a half regarding medications, even after being told multiple times that we have completed her requests.

## 2023-12-21 NOTE — Telephone Encounter (Signed)
Patient denied for Zepbound.

## 2024-01-22 ENCOUNTER — Other Ambulatory Visit: Payer: Self-pay | Admitting: Nurse Practitioner

## 2024-01-22 DIAGNOSIS — Z3169 Encounter for other general counseling and advice on procreation: Secondary | ICD-10-CM

## 2024-01-23 ENCOUNTER — Encounter: Payer: Self-pay | Admitting: Nurse Practitioner

## 2024-02-01 ENCOUNTER — Encounter: Payer: Self-pay | Admitting: Gastroenterology

## 2024-02-01 DIAGNOSIS — R109 Unspecified abdominal pain: Secondary | ICD-10-CM

## 2024-02-01 MED ORDER — HYOSCYAMINE SULFATE 0.125 MG SL SUBL: 0.1250 mg | SUBLINGUAL_TABLET | SUBLINGUAL | 0 refills | Status: DC | PRN

## 2024-02-05 ENCOUNTER — Ambulatory Visit: Admitting: Nurse Practitioner

## 2024-02-05 ENCOUNTER — Encounter: Payer: Self-pay | Admitting: Nurse Practitioner

## 2024-02-05 VITALS — BP 110/84 | HR 85 | Temp 98.4°F | Resp 16 | Ht 61.0 in | Wt 150.8 lb

## 2024-02-05 DIAGNOSIS — G8929 Other chronic pain: Secondary | ICD-10-CM

## 2024-02-05 DIAGNOSIS — M25511 Pain in right shoulder: Secondary | ICD-10-CM

## 2024-02-05 DIAGNOSIS — E6609 Other obesity due to excess calories: Secondary | ICD-10-CM

## 2024-02-05 DIAGNOSIS — M5441 Lumbago with sciatica, right side: Secondary | ICD-10-CM | POA: Diagnosis not present

## 2024-02-05 DIAGNOSIS — E66811 Obesity, class 1: Secondary | ICD-10-CM | POA: Diagnosis not present

## 2024-02-05 DIAGNOSIS — Z6830 Body mass index (BMI) 30.0-30.9, adult: Secondary | ICD-10-CM

## 2024-02-05 NOTE — Progress Notes (Signed)
 [facility] [address] [place], [place] [address]  Internal MEDICINE  Office Visit Note  Patient Name: Jaclyn Gray  [id number]  [id number]  Date of Service: 02/05/2024  Chief Complaint  Patient presents with   Follow-up    Weight loss     HPI Jaclyn Gray presents for a follow-up visit for weight loss, chronic back pain, right foot pain and chronic right shoulder pain.  Weight loss -- lost 13 lbs since starting zepbound, denies any adverse side effects.  Chronic back pain -- has been getting massages and paying out of pocket. The masseuse told the patient that she may have a pinched nerve area her neck.  Right foot pain -- not resolved Chronic right shoulder pain -- went to physical therapy which helped but now the pain has been coming back and worsening.    Current Medication: Outpatient Encounter Medications as of 02/05/2024  Medication Sig Note   acetaminophen (TYLENOL) 500 MG tablet Take 2 tablets (1,000 mg total) by mouth every 6 (six) hours as needed.    amitriptyline (ELAVIL) 25 MG tablet TAKE 1 TABLET(25 MG) BY MOUTH AT BEDTIME    benzonatate (TESSALON) 200 MG capsule Take 1 capsule by mouth every 8 (eight) hours for cough.    cyclobenzaprine (FLEXERIL) 5 MG tablet Take 1 tablet (5 mg total) by mouth 3 (three) times daily as needed for muscle spasms.    EMGALITY 120 MG/ML SOAJ Inject 1 mL into the skin every 30 (thirty) days. 12/03/2023: Hasn't started taking   folic acid (FOLVITE) 1 MG tablet TAKE 1 TABLET(1 MG) BY MOUTH DAILY    hyoscyamine (LEVSIN SL) 0.125 MG SL tablet Place 1 tablet (0.125 mg total) under the tongue every 4 (four) hours as needed.    ibuprofen (ADVIL) 600 MG tablet Take 1 tablet (600 mg total) by mouth every 6 (six) hours as needed.    naproxen (NAPROSYN) 375 MG tablet Take 1 tablet (375 mg total) by mouth 2 (two) times daily with a meal.    nitroGLYCERIN (NITROSTAT) 0.4 MG SL tablet Place 1 tablet (0.4 mg total) under the tongue every  5 (five) minutes as needed for chest pain. X3 doses. If no relief after 3rd dose, call 911 or go to ER.    Prenatal Vit-Fe Fumarate-FA (MULTIVITAMIN-PRENATAL) 27-0.8 MG TABS tablet Take 1 tablet by mouth daily at 12 noon.    promethazine (PHENERGAN) 12.5 MG tablet Take 1 tablet (12.5 mg total) by mouth every 8 (eight) hours as needed for nausea or vomiting.    triamcinolone cream (KENALOG) 0.1 % Apply 1 Application topically 2 (two) times daily. To affected area    [DISCONTINUED] tirzepatide (ZEPBOUND) 5 MG/0.5ML Pen Inject 5 mg into the skin once a week.    No facility-administered encounter medications on file as of 02/05/2024.    Surgical History: Past Surgical History:  Procedure Laterality Date   BUNIONECTOMY Right 04/21/2022   COLONOSCOPY WITH PROPOFOL N/A 06/06/2023   Procedure: COLONOSCOPY WITH PROPOFOL;  Surgeon: Toney Reil, MD;  Location: [facility] ENDOSCOPY;  Service: Gastroenterology;  Laterality: N/A;   WISDOM TOOTH EXTRACTION     x4 extracted     Medical History: 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    Family History: Family History  Problem Relation Age of Onset   Hypertension Mother    Breast cancer Mother 60   Prostate cancer Father    Hypertension Father    Diabetes Father    Throat cancer Maternal Grandmother    Colon cancer Maternal Grandmother     Lung cancer Maternal Grandfather     Social History   Socioeconomic History   Marital status: Single    Spouse name: Not on file   Number of children: 1   Years of education: Not on file   Highest education level: Not on file  Occupational History   Occupation: Hotel manager  Tobacco Use   Smoking status: Never    Passive exposure: Never   Smokeless tobacco: Never  Vaping Use   Vaping status: Never Used  Substance and Sexual Activity   Alcohol use: Not Currently    Comment: social    Drug use: Never   Sexual activity: Not Currently    Birth control/protection: None  Other Topics Concern   Not on file  Social History Narrative   Not on file   Social Drivers of Health   Financial Resource Strain: Not on file  Food Insecurity: Not on file  Transportation Needs: Not on file  Physical Activity: Not on file  Stress: Not on file  Social Connections: Not on file  Intimate Partner Violence: Not on file      Review of Systems  Constitutional:  Positive for appetite change, fatigue and unexpected weight change.  HENT: Negative.    Respiratory: Negative.  Negative for cough, chest tightness, shortness of breath and wheezing.   Cardiovascular: Negative.  Negative for chest pain and palpitations.  Musculoskeletal:  Positive for arthralgias, back pain and myalgias.    Vital Signs: BP 110/84   Pulse 85   Temp 98.4 F (36.9 C)   Resp 16   Ht 5\' 1"  (1.549 m)   Wt 150 lb 12.8 oz (68.4 kg)   SpO2 97%   BMI 28.49 kg/m    Physical Exam Vitals reviewed.  Constitutional:      General: She is not in acute distress.    Appearance: Normal appearance. She is not ill-appearing.  HENT:     Head: Normocephalic and atraumatic.  Eyes:     Pupils: Pupils are equal, round, and reactive to light.  Cardiovascular:     Rate and Rhythm: Normal rate  and regular rhythm.  Pulmonary:     Effort: Pulmonary effort is normal. No respiratory distress.  Neurological:     Mental Status: She  is alert and oriented to person, place, and time.  Psychiatric:        Mood and Affect: Mood normal.        Behavior: Behavior normal.        Assessment/Plan: 1. Acute right-sided low back pain with right-sided sciatica (Primary) Continue cyclobenzaprine as prescribed and OTC NSAIDS as desired    2. Chronic right shoulder pain Continue cyclobenzaprine as prescribed and OTC NSAIDS as desired    3. Class 1 obesity due to excess calories without serious comorbidity with body mass index (BMI) of 30.0 to 30.9 in adult Continue zepbound as prescribed.    General Counseling: Rayn verbalizes understanding of the findings of todays visit and agrees with plan of treatment. I have discussed any further diagnostic evaluation that may be needed or ordered today. We also reviewed her medications today. she has been encouraged to call the office with any questions or concerns that should arise related to todays visit.    No orders of the defined types were placed in this encounter.   No orders of the defined types were placed in this encounter.   Return in about 3 months (around 05/04/2024) for F/U, Weight loss, Keegan Bensch PCP.   Total time spent:30 Minutes Time spent includes review of chart, medications, test results, and follow up plan with the patient.   [place] Controlled Substance Database was reviewed by me.  This patient was seen by Sallyanne Kuster, FNP-C in collaboration with Dr. Beverely Risen as a part of collaborative care agreement.   Diesha Rostad R. Tedd Sias, MSN, FNP-C Internal medicine

## 2024-03-03 ENCOUNTER — Encounter: Payer: Self-pay | Admitting: Nurse Practitioner

## 2024-03-06 MED ORDER — TIRZEPATIDE 7.5 MG/0.5ML ~~LOC~~ SOAJ: 7.5000 mg | SUBCUTANEOUS | 3 refills | Status: DC

## 2024-03-09 ENCOUNTER — Encounter: Payer: Self-pay | Admitting: Nurse Practitioner

## 2024-03-17 MED ORDER — TIRZEPATIDE-WEIGHT MANAGEMENT 7.5 MG/0.5ML ~~LOC~~ SOLN: 7.5000 mg | SUBCUTANEOUS | 5 refills | Status: DC

## 2024-03-17 NOTE — Addendum Note (Signed)
 Addended by: Sallyanne Kuster on: 03/17/2024 07:44 AM   Modules accepted: Orders

## 2024-03-19 ENCOUNTER — Encounter: Payer: Self-pay | Admitting: Nurse Practitioner

## 2024-03-19 ENCOUNTER — Telehealth: Payer: Self-pay

## 2024-03-19 DIAGNOSIS — N6310 Unspecified lump in the right breast, unspecified quadrant: Secondary | ICD-10-CM

## 2024-03-20 MED ORDER — TIRZEPATIDE-WEIGHT MANAGEMENT 7.5 MG/0.5ML ~~LOC~~ SOAJ: 7.5000 mg | SUBCUTANEOUS | 5 refills | Status: AC

## 2024-03-20 NOTE — Telephone Encounter (Signed)
Lmom that we send med °

## 2024-03-27 ENCOUNTER — Ambulatory Visit
Admission: RE | Admit: 2024-03-27 | Discharge: 2024-03-27 | Disposition: A | Discharge status: Home / Self Care | Source: Ambulatory Visit | Attending: Nurse Practitioner | Admitting: Nurse Practitioner

## 2024-03-27 DIAGNOSIS — N6310 Unspecified lump in the right breast, unspecified quadrant: Secondary | ICD-10-CM | POA: Insufficient documentation

## 2024-04-02 ENCOUNTER — Ambulatory Visit (INDEPENDENT_AMBULATORY_CARE_PROVIDER_SITE_OTHER): Admitting: Nurse Practitioner

## 2024-04-02 ENCOUNTER — Encounter: Payer: Self-pay | Admitting: Nurse Practitioner

## 2024-04-02 VITALS — BP 120/86 | HR 74 | Temp 98.4°F | Resp 16 | Ht 61.0 in | Wt 151.0 lb

## 2024-04-02 DIAGNOSIS — M5441 Lumbago with sciatica, right side: Secondary | ICD-10-CM

## 2024-04-02 DIAGNOSIS — M542 Cervicalgia: Secondary | ICD-10-CM

## 2024-04-02 DIAGNOSIS — L658 Other specified nonscarring hair loss: Secondary | ICD-10-CM

## 2024-04-02 DIAGNOSIS — R6882 Decreased libido: Secondary | ICD-10-CM

## 2024-04-02 DIAGNOSIS — G43109 Migraine with aura, not intractable, without status migrainosus: Secondary | ICD-10-CM

## 2024-04-02 DIAGNOSIS — F411 Generalized anxiety disorder: Secondary | ICD-10-CM

## 2024-04-02 DIAGNOSIS — M25511 Pain in right shoulder: Secondary | ICD-10-CM

## 2024-04-02 DIAGNOSIS — M79672 Pain in left foot: Secondary | ICD-10-CM

## 2024-04-02 DIAGNOSIS — G8929 Other chronic pain: Secondary | ICD-10-CM

## 2024-04-02 MED ORDER — VENLAFAXINE HCL ER 37.5 MG PO CP24: 37.5000 mg | ORAL_CAPSULE | Freq: Every day | ORAL | 1 refills | Status: AC

## 2024-04-02 NOTE — Progress Notes (Unsigned)
 [facility] [address] [place], [place] [address]  Internal MEDICINE  Office Visit Note  Patient Name: Jaclyn Gray  [id number]  [id number]  Date of Service: 04/02/2024  Chief Complaint  Patient presents with   Follow-up    Referrals     HPI Jaclyn Gray presents for a follow-up visit for  Chronic      Current Medication: Outpatient Encounter Medications as of 04/02/2024  Medication Sig Note   acetaminophen (TYLENOL) 500 MG tablet Take 2 tablets (1,000 mg total) by mouth every 6 (six) hours as needed.    amitriptyline (ELAVIL) 25 MG tablet TAKE 1 TABLET(25 MG) BY MOUTH AT BEDTIME    benzonatate (TESSALON) 200 MG capsule Take 1 capsule by mouth every 8 (eight) hours for cough.    cyclobenzaprine (FLEXERIL) 5 MG tablet Take 1 tablet (5 mg total) by mouth 3 (three) times daily as needed for muscle spasms.    EMGALITY 120 MG/ML SOAJ Inject 1 mL into the skin every 30 (thirty) days. 12/03/2023: Hasn't started taking   folic acid (FOLVITE) 1 MG tablet TAKE 1 TABLET(1 MG) BY MOUTH DAILY    hyoscyamine (LEVSIN SL) 0.125 MG SL tablet Place 1 tablet (0.125 mg total) under the tongue every 4 (four) hours as needed.    ibuprofen (ADVIL) 600 MG tablet Take 1 tablet (600 mg total) by mouth every 6 (six) hours as needed.    naproxen (NAPROSYN) 375 MG tablet Take 1 tablet (375 mg total) by mouth 2 (two) times daily with a meal.    nitroGLYCERIN (NITROSTAT) 0.4 MG SL tablet Place 1 tablet (0.4 mg total) under the tongue every 5 (five) minutes as needed for chest pain. X3 doses. If no relief after 3rd dose, call 911 or go to ER.    Prenatal Vit-Fe Fumarate-FA (MULTIVITAMIN-PRENATAL) 27-0.8 MG TABS tablet Take 1 tablet by mouth daily at 12 noon.    promethazine (PHENERGAN) 12.5 MG tablet Take 1 tablet (12.5 mg total) by mouth every 8 (eight) hours as needed for nausea or vomiting.    tirzepatide (ZEPBOUND) 7.5 MG/0.5ML Pen Inject 7.5 mg into the skin once a week.    triamcinolone  cream (KENALOG) 0.1 % Apply 1 Application topically 2 (two) times daily. To affected area    No facility-administered encounter medications on file as of 04/02/2024.    Surgical History: Past Surgical History:  Procedure Laterality Date   BUNIONECTOMY Right 04/21/2022   COLONOSCOPY WITH PROPOFOL N/A 06/06/2023   Procedure: COLONOSCOPY WITH PROPOFOL;  Surgeon: Toney Reil, MD;  Location: [facility] ENDOSCOPY;  Service: Gastroenterology;  Laterality: N/A;   WISDOM TOOTH EXTRACTION     x4 extracted     Medical History: Past Medical History:  Diagnosis Date   ASCUS with positive high risk HPV cervical on 07/21/2020 07/29/2020   At risk for fertility problems 08/02/2019   Generalized anxiety disorder 05/24/2020   Low back pain 05/07/2017   Migraine    Uterine leiomyoma 10/26/2020   At anatomy u/s 09/15/20:  Myomas  Site                     L(cm)      W(cm)      D(cm)       Location  Posterior                5.7        4.7        5.4  Intramural  Posterior                3.5        3.7        4           Intramural  Posterior                3.3        2.6        3.2         Intramural   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    Family History: Family History  Problem Relation Age of Onset   Hypertension Mother    Breast cancer Mother 19   Prostate cancer Father    Hypertension Father    Diabetes Father    Throat cancer Maternal Grandmother    Colon cancer Maternal Grandmother    Lung cancer Maternal Grandfather     Social History   Socioeconomic History   Marital status: Single    Spouse name: Not on file   Number of children: 1   Years of education: Not on file   Highest education level: Not on file  Occupational History   Occupation: Hotel manager  Tobacco Use   Smoking status: Never    Passive exposure: Never   Smokeless tobacco: Never  Vaping Use   Vaping status: Never Used  Substance and Sexual Activity   Alcohol use: Not  Currently    Comment: social    Drug use: Never   Sexual activity: Not Currently    Birth control/protection: None  Other Topics Concern   Not on file  Social History Narrative   Not on file   Social Drivers of Health   Financial Resource Strain: Not on file  Food Insecurity: Not on file  Transportation Needs: Not on file  Physical Activity: Not on file  Stress: Not on file  Social Connections: Not on file  Intimate Partner Violence: Not on file      Review of Systems  Vital Signs: BP 120/86   Pulse 74   Temp 98.4 F (36.9 C)   Resp 16   Ht 5\' 1"  (1.549 m)   Wt 151 lb (68.5 kg)   LMP 03/13/2024   SpO2 99%   BMI 28.53 kg/m    Physical Exam     Assessment/Plan:   General Counseling: Jaclyn Gray verbalizes understanding of the findings of todays visit and agrees with plan of treatment. I have discussed any further diagnostic evaluation that may be needed or ordered today. We also reviewed her medications today. she has been encouraged to call the office with any questions or concerns that should arise related to todays visit.    No orders of the defined types were placed in this encounter.   No orders of the defined types were placed in this encounter.   No follow-ups on file.   Total time spent:*** Minutes Time spent includes review of chart, medications, test results, and follow up plan with the patient.   [place] Controlled Substance Database was reviewed by me.  This patient was seen by Sallyanne Kuster, FNP-C in collaboration with Dr. Beverely Risen as a part of collaborative care agreement.   Alphons Burgert R. Tedd Sias, MSN, FNP-C Internal medicine

## 2024-04-03 ENCOUNTER — Encounter: Payer: Self-pay | Admitting: Nurse Practitioner

## 2024-04-04 ENCOUNTER — Telehealth: Payer: Self-pay | Admitting: Nurse Practitioner

## 2024-04-04 ENCOUNTER — Encounter: Payer: Self-pay | Admitting: Nurse Practitioner

## 2024-04-04 ENCOUNTER — Ambulatory Visit (INDEPENDENT_AMBULATORY_CARE_PROVIDER_SITE_OTHER): Admitting: Nurse Practitioner

## 2024-04-04 DIAGNOSIS — R319 Hematuria, unspecified: Secondary | ICD-10-CM

## 2024-04-04 DIAGNOSIS — N390 Urinary tract infection, site not specified: Secondary | ICD-10-CM | POA: Diagnosis not present

## 2024-04-04 LAB — POCT URINALYSIS DIPSTICK
Bilirubin, UA: NEGATIVE
Glucose, UA: NEGATIVE
Nitrite, UA: NEGATIVE
Protein, UA: NEGATIVE
Spec Grav, UA: 1.015 (ref 1.010–1.025)
Urobilinogen, UA: 0.2 U/dL
pH, UA: 5 (ref 5.0–8.0)

## 2024-04-04 MED ORDER — NITROFURANTOIN MONOHYD MACRO 100 MG PO CAPS: 100.0000 mg | ORAL_CAPSULE | Freq: Two times a day (BID) | ORAL | 0 refills | Status: AC

## 2024-04-04 NOTE — Telephone Encounter (Signed)
 Orthopedic & Podiatry referrals pending [employer] approval-Toni

## 2024-04-04 NOTE — Progress Notes (Signed)
 As per Jaclyn Gray patient is here to drop off urine as she was seen in the office on Wednesday.

## 2024-04-07 ENCOUNTER — Ambulatory Visit

## 2024-04-08 ENCOUNTER — Telehealth: Payer: Self-pay | Admitting: Nurse Practitioner

## 2024-04-08 NOTE — Telephone Encounter (Signed)
 Orthopedic referral sent via [employer] to [facility].  Notified patient. Gave pt telephone# [phone]-Toni  Podiatry referral sent via [employer] to [facility].  Notified patient. Gave pt telephone# [phone]-Toni

## 2024-04-09 ENCOUNTER — Telehealth: Payer: Self-pay | Admitting: Nurse Practitioner

## 2024-04-09 LAB — CULTURE, URINE COMPREHENSIVE

## 2024-04-09 NOTE — Telephone Encounter (Signed)
Dermatology referral sent via [employer] to [facility] -Vivien Rota

## 2024-04-09 NOTE — Telephone Encounter (Signed)
 Podiatry appointment 05/01/2024 @ [facility]

## 2024-04-14 ENCOUNTER — Encounter: Payer: Self-pay | Admitting: Nurse Practitioner

## 2024-04-16 ENCOUNTER — Telehealth: Payer: Self-pay | Admitting: Nurse Practitioner

## 2024-04-16 NOTE — Telephone Encounter (Signed)
 Orthopedic appointment 04/24/2024 @ [facility]

## 2024-04-23 ENCOUNTER — Telehealth: Payer: Self-pay | Admitting: Nurse Practitioner

## 2024-04-23 NOTE — Telephone Encounter (Signed)
 All medical records printed. Patient to p/u one set at front desk, and 2nd set mailed to [employer]; [address]; [place], [place] [address]

## 2024-05-01 ENCOUNTER — Ambulatory Visit (HOSPITAL_COMMUNITY): Admission: EM | Admit: 2024-05-01 | Discharge: 2024-05-01 | Disposition: A | Discharge status: Home / Self Care

## 2024-05-01 ENCOUNTER — Encounter (HOSPITAL_COMMUNITY): Payer: Self-pay

## 2024-05-01 ENCOUNTER — Ambulatory Visit (INDEPENDENT_AMBULATORY_CARE_PROVIDER_SITE_OTHER)

## 2024-05-01 DIAGNOSIS — S9032XA Contusion of left foot, initial encounter: Secondary | ICD-10-CM

## 2024-05-01 NOTE — ED Provider Notes (Signed)
 [facility]    CSN: [id number] Arrival date & time: 05/01/24  1300      History   Chief Complaint Chief Complaint  Patient presents with   Foot Injury    HPI Jaclyn Gray is a 43 y.o. female who presents with L foot pain after hitting it on the wall accidentally when she was running. Pain is located on dorsal foot. Pain is worse with weight bearing.     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    Patient Active Problem List   Diagnosis Date Noted   Family history of colonic polyps 06/06/2023   Erythema of rectum 06/06/2023   Screening for colon cancer 06/06/2023   Other osteonecrosis, 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   Elevated rheumatoid factor 04/16/2023   Nail dystrophy 03/27/2023   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   Other examination of ears and hearing 02/20/2022   Pain in joint, lower leg 02/20/2022   Contraceptive management 02/20/2022   Screening for malignant neoplasm of cervix 02/20/2022   Sprain of sacroiliac region 02/20/2022   Uterine leiomyoma 10/26/2020   Stress at work 10/26/2020   Chronic right shoulder pain 05/24/2020   Generalized anxiety disorder 05/24/2020   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   Bunion of right foot 07/31/2017   Pain in left foot 07/31/2017   Radial styloid  tenosynovitis (de quervain) 06/15/2017   Pain in right hip 06/13/2017   Low back pain 05/07/2017   Neck pain 05/07/2017   Pain in thoracic spine 05/07/2017   Vitamin D  deficiency 11/03/2014   Family history of malignant neoplasm of breast 10/23/2014   Problems of adjustment to life-cycle transitions 10/23/2014    Past Surgical History:  Procedure Laterality Date   BUNIONECTOMY Right 04/21/2022   COLONOSCOPY WITH PROPOFOL  N/A 06/06/2023   Procedure: COLONOSCOPY WITH PROPOFOL ;  Surgeon: Selena Daily, MD;  Location: [facility] ENDOSCOPY;  Service: Gastroenterology;  Laterality: N/A;   WISDOM TOOTH EXTRACTION     x4 extracted     OB History     Gravida  1   Para  1  Term  1   Preterm  0   AB  0   Living  1      SAB  0   IAB  0   Ectopic  0   Multiple  0   Live Births  1            Home Medications    Prior to Admission medications   Medication Sig Start Date End Date Taking? Authorizing Provider  escitalopram (LEXAPRO) 10 MG tablet Take 10 mg by mouth daily.   Yes [provider]  acetaminophen  (TYLENOL ) 500 MG tablet Take 2 tablets (1,000 mg total) by mouth every 6 (six) hours as needed. 06/16/22   Starlene Eaton, FNP  amitriptyline  (ELAVIL ) 25 MG tablet TAKE 1 TABLET(25 MG) BY MOUTH AT BEDTIME 09/27/23   Vanga, Rohini Reddy, MD  cyclobenzaprine  (FLEXERIL ) 5 MG tablet Take 1 tablet (5 mg total) by mouth 3 (three) times daily as needed for muscle spasms. 12/21/23   Abernathy, Janeice Medal, NP  EMGALITY 120 MG/ML SOAJ Inject 1 mL into the skin every 30 (thirty) days.    [provider]  folic acid  (FOLVITE ) 1 MG tablet TAKE 1 TABLET(1 MG) BY MOUTH DAILY 01/23/24   Abernathy, Janeice Medal, NP  hyoscyamine  (LEVSIN SL) 0.125 MG SL tablet Place 1 tablet (0.125 mg total) under the tongue every 4 (four) hours as needed. 02/01/24   Selena Daily, MD  ibuprofen  (ADVIL ) 600 MG tablet Take 1 tablet (600 mg total) by mouth every 6 (six) hours as needed.  10/02/23   Laurence Pons, NP  naproxen  (NAPROSYN ) 375 MG tablet Take 1 tablet (375 mg total) by mouth 2 (two) times daily with a meal. 03/30/23   Adolph Hoop, PA-C  Prenatal Vit-Fe Fumarate-FA (MULTIVITAMIN-PRENATAL) 27-0.8 MG TABS tablet Take 1 tablet by mouth daily at 12 noon. 01/24/21   Anyanwu, Ugonna A, MD  tirzepatide  (ZEPBOUND ) 7.5 MG/0.5ML Pen Inject 7.5 mg into the skin once a week. 03/20/24   Laurence Pons, NP  triamcinolone  cream (KENALOG ) 0.1 % Apply 1 Application topically 2 (two) times daily. To affected area 09/27/22   Laurence Pons, NP  venlafaxine  XR (EFFEXOR -XR) 37.5 MG 24 hr capsule Take 1 capsule (37.5 mg total) by mouth daily with breakfast. 04/02/24   Laurence Pons, NP    Family History Family History  Problem Relation Age of Onset   Hypertension Mother    Breast cancer Mother 48   Prostate cancer Father    Hypertension Father    Diabetes Father    Throat cancer Maternal Grandmother    Colon cancer Maternal Grandmother    Lung cancer Maternal Grandfather     Social History Social History   Tobacco Use   Smoking status: Never    Passive exposure: Never   Smokeless tobacco: Never  Vaping Use   Vaping status: Never Used  Substance Use Topics   Alcohol use: Not Currently    Comment: social    Drug use: Never     Allergies   Patient has no known allergies.   Review of Systems Review of Systems As noted in HPI  Physical Exam Triage Vital Signs ED Triage Vitals  Encounter Vitals Group     BP 05/01/24 1327 115/77     Systolic BP Percentile --      Diastolic BP Percentile --      Pulse Rate 05/01/24 1327 73     Resp 05/01/24 1327 14     Temp 05/01/24 1327 98.3 F (  36.8 C)     Temp Source 05/01/24 1327 Oral     SpO2 05/01/24 1327 97 %     Weight --      Height --      Head Circumference --      Peak Flow --      Pain Score 05/01/24 1326 8     Pain Loc --      Pain Education --      Exclude from Growth Chart --    No data  found.  Updated Vital Signs BP 115/77 (BP Location: Right Arm)   Pulse 73   Temp 98.3 F (36.8 C) (Oral)   Resp 14   LMP 04/30/2024   SpO2 97%   Visual Acuity Right Eye Distance:   Left Eye Distance:   Bilateral Distance:    Right Eye Near:   Left Eye Near:    Bilateral Near:     Physical Exam Vitals and nursing note reviewed.  Constitutional:      General: She is not in acute distress.    Appearance: She is not toxic-appearing.  HENT:     Right Ear: External ear normal.     Left Ear: External ear normal.  Eyes:     General: No scleral icterus.    Conjunctiva/sclera: Conjunctivae normal.  Cardiovascular:     Pulses: Normal pulses.  Pulmonary:     Effort: Pulmonary effort is normal.  Musculoskeletal:        General: Tenderness present. No swelling, deformity or signs of injury.     Cervical back: Neck supple.     Comments: L FOOT- with no swelling or ecchymosis. Has slight tenderness of the 4th toe, and 3rd and 4th metatarsal region. ROM of toes is decreased. R ANKLE- non tender with normal ROM  Skin:    General: Skin is warm and dry.     Capillary Refill: Capillary refill takes less than 2 seconds.     Findings: No bruising, erythema, lesion or rash.  Neurological:     Mental Status: She is alert and oriented to person, place, and time.     Gait: Gait abnormal.  Psychiatric:        Mood and Affect: Mood normal.        Behavior: Behavior normal.        Thought Content: Thought content normal.        Judgment: Judgment normal.      UC Treatments / Results  Labs (all labs ordered are listed, but only abnormal results are displayed) Labs Reviewed - No data to display  EKG   Radiology DG Foot Complete Left Result Date: 05/01/2024 CLINICAL DATA:  Trauma to the left foot. EXAM: LEFT FOOT - COMPLETE 3+ VIEW COMPARISON:  None Available. FINDINGS: There is no evidence of fracture or dislocation. There is no evidence of arthropathy or other focal bone  abnormality. Soft tissues are unremarkable. IMPRESSION: Negative. Electronically Signed   By: Angus Bark M.D.   On: 05/01/2024 13:53    Procedures Procedures (including critical care time)  Medications Ordered in UC Medications - No data to display  Initial Impression / Assessment and Plan / UC Course  I have reviewed the triage vital signs and the nursing notes.  Pertinent labs & imaging results that were available during my care of the patient were reviewed by me and considered in my medical decision making (see chart for details).   Contusion L foot  She was placed on Post  op shoe and crutches Needs to Ice and take Ibuprofen  and Tylenol  as noted in instructions.  Needs to FU with PCP next week if pain persists.   Final Clinical Impressions(s) / UC Diagnoses   Final diagnoses:  Contusion of left foot, initial encounter     Discharge Instructions      Ice area of pain with cloth over the skin for 20 minutes 3-4 times a day today and tomorrow Follow up with your primary care doctor next week You may take Ibuprofen  800 mg every 8 hours, and OK to add Tylenol  up to 1000 mg between the Ibuprofen  doses.    ED Prescriptions   None    PDMP not reviewed this encounter.   Vonda Guadeloupe, PA-C 05/01/24 1422

## 2024-05-01 NOTE — Discharge Instructions (Signed)
 Ice area of pain with cloth over the skin for 20 minutes 3-4 times a day today and tomorrow Follow up with your primary care doctor next week You may take Ibuprofen  800 mg every 8 hours, and OK to add Tylenol  up to 1000 mg between the Ibuprofen  doses.

## 2024-05-01 NOTE — ED Triage Notes (Addendum)
 Patient reports that she was running today and accidentally kicked a wall. Patient is now c/o pain on top of the left foot. Patient states weight bearing makes the pain worse.   Patient denies taking pain medicatin for her foot pain.

## 2024-05-18 ENCOUNTER — Other Ambulatory Visit: Payer: Self-pay | Admitting: Nurse Practitioner

## 2024-05-18 ENCOUNTER — Other Ambulatory Visit: Payer: Self-pay | Admitting: Gastroenterology

## 2024-05-18 DIAGNOSIS — R0789 Other chest pain: Secondary | ICD-10-CM

## 2024-05-20 ENCOUNTER — Other Ambulatory Visit: Payer: Self-pay | Admitting: Nurse Practitioner

## 2024-05-20 DIAGNOSIS — M25551 Pain in right hip: Secondary | ICD-10-CM

## 2024-05-20 DIAGNOSIS — M5441 Lumbago with sciatica, right side: Secondary | ICD-10-CM
